# Patient Record
Sex: Female | Born: 1995 | Hispanic: Yes | Marital: Single | Smoking: Former smoker
Health system: Southern US, Community
[De-identification: ages and names within clinical notes are randomized; demographics above are authoritative.]

## PROBLEM LIST (undated history)

## (undated) ENCOUNTER — Inpatient Hospital Stay: Payer: Self-pay

## (undated) ENCOUNTER — Ambulatory Visit: Admission: EM | Payer: 59 | Source: Home / Self Care

## (undated) DIAGNOSIS — IMO0001 Reserved for inherently not codable concepts without codable children: Secondary | ICD-10-CM

## (undated) DIAGNOSIS — F329 Major depressive disorder, single episode, unspecified: Secondary | ICD-10-CM

## (undated) DIAGNOSIS — F32A Depression, unspecified: Secondary | ICD-10-CM

## (undated) DIAGNOSIS — F4329 Adjustment disorder with other symptoms: Secondary | ICD-10-CM

## (undated) DIAGNOSIS — F419 Anxiety disorder, unspecified: Secondary | ICD-10-CM

## (undated) HISTORY — PX: NO PAST SURGERIES: SHX2092

## (undated) SURGERY — LAPAROSCOPIC CHOLECYSTECTOMY
Anesthesia: General

---
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
## 2013-01-11 ENCOUNTER — Emergency Department: Payer: Self-pay | Admitting: Emergency Medicine

## 2013-01-11 LAB — URINALYSIS, COMPLETE
Bilirubin,UR: NEGATIVE
Blood: NEGATIVE
Glucose,UR: NEGATIVE mg/dL (ref 0–75)
Ketone: NEGATIVE
Ph: 5 (ref 4.5–8.0)
Protein: 30
Specific Gravity: 1.025 (ref 1.003–1.030)
Squamous Epithelial: 12

## 2013-01-14 ENCOUNTER — Emergency Department: Payer: Self-pay | Admitting: Emergency Medicine

## 2014-06-08 NOTE — L&D Delivery Note (Cosign Needed)
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 Labor was induced with placement of FB, vaginal cytotec and pitocin. Patient had SROM and was complete at 0650. She labored down then pushed through several contractions to deliver her baby. Neonatology asked to attend birth for fetal tachycardia and slight maternal temperature elevation during labor. Patient GBS positive and received adequate treatment.   At 8:39 AM a viable and healthy female was delivered via Vaginal, Spontaneous Delivery (Presentation: ROA).  APGAR: 8, 9; weight pending.   Placenta status: intact.  Cord: 3 vessels with the following complications: none.  Cord pH: N/A  Pitocin, methergine and rectal cytotec given for primary postpartum hemorrhage.   Anesthesia:  Epidural Episiotomy:  None Lacerations:  Superficial periurethral tears bilaterally.  Suture Repair: N/A Est. Blood Loss (mL):  750  Mom to postpartum.  Baby to Couplet care / Skin to Skin.  Jamelle HaringHillary M Fitzgerald, MD [facility], PGY-1 04/07/2015, 9:11 AM

## 2014-06-14 ENCOUNTER — Emergency Department: Payer: Self-pay | Admitting: Emergency Medicine

## 2014-06-14 LAB — COMPREHENSIVE METABOLIC PANEL
ALK PHOS: 78 U/L
ALT: 28 U/L
AST: 17 U/L (ref 15–37)
Albumin: 4.1 g/dL (ref 3.4–5.0)
Anion Gap: 7 (ref 7–16)
BUN: 9 mg/dL (ref 7–18)
Bilirubin,Total: 0.2 mg/dL (ref 0.2–1.0)
CALCIUM: 9 mg/dL (ref 8.5–10.1)
CO2: 25 mmol/L (ref 21–32)
Chloride: 105 mmol/L (ref 98–107)
Creatinine: 0.59 mg/dL — ABNORMAL LOW (ref 0.60–1.30)
EGFR (African American): >60
Glucose: 93 mg/dL (ref 65–99)
Osmolality: 272 (ref 275–301)
Potassium: 4 mmol/L (ref 3.5–5.1)
Sodium: 137 mmol/L (ref 136–145)
Total Protein: 7.7 g/dL (ref 6.4–8.2)

## 2014-06-14 LAB — LIPASE, BLOOD: Lipase: 121 U/L (ref 73–393)

## 2014-06-14 LAB — URINALYSIS, COMPLETE
Bacteria: NONE SEEN
Bilirubin,UR: NEGATIVE
Blood: NEGATIVE
Glucose,UR: NEGATIVE mg/dL (ref 0–75)
KETONE: NEGATIVE
Nitrite: NEGATIVE
Ph: 6 (ref 4.5–8.0)
Protein: NEGATIVE
RBC, UR: NONE SEEN /HPF (ref 0–5)
Specific Gravity: 1.012 (ref 1.003–1.030)
Squamous Epithelial: 2

## 2014-06-14 LAB — CBC WITH DIFFERENTIAL/PLATELET
BASOS ABS: 0 10*3/uL (ref 0.0–0.1)
BASOS PCT: 0.4 %
EOS ABS: 0.1 10*3/uL (ref 0.0–0.7)
EOS PCT: 1.7 %
HCT: 44.7 % (ref 35.0–47.0)
HGB: 14.8 g/dL (ref 12.0–16.0)
Lymphocyte #: 2.3 10*3/uL (ref 1.0–3.6)
Lymphocyte %: 27.9 %
MCH: 28.5 pg (ref 26.0–34.0)
MCHC: 33.1 g/dL (ref 32.0–36.0)
MCV: 86 fL (ref 80–100)
Monocyte #: 0.6 x10 3/mm (ref 0.2–0.9)
Monocyte %: 7.5 %
Neutrophil #: 5.1 10*3/uL (ref 1.4–6.5)
Neutrophil %: 62.5 %
PLATELETS: 242 10*3/uL (ref 150–440)
RBC: 5.21 10*6/uL — ABNORMAL HIGH (ref 3.80–5.20)
RDW: 13.1 % (ref 11.5–14.5)
WBC: 8.1 10*3/uL (ref 3.6–11.0)

## 2014-06-14 LAB — HCG, QUANTITATIVE, PREGNANCY

## 2014-06-20 ENCOUNTER — Emergency Department: Payer: Self-pay | Admitting: Emergency Medicine

## 2014-08-28 LAB — OB RESULTS CONSOLE GC/CHLAMYDIA
Chlamydia: NEGATIVE
Gonorrhea: NEGATIVE

## 2014-08-28 LAB — OB RESULTS CONSOLE HIV ANTIBODY (ROUTINE TESTING): HIV: NONREACTIVE

## 2014-08-29 LAB — OB RESULTS CONSOLE RPR: RPR: NONREACTIVE

## 2014-08-29 LAB — OB RESULTS CONSOLE HEPATITIS B SURFACE ANTIGEN: Hepatitis B Surface Ag: NEGATIVE

## 2014-08-30 ENCOUNTER — Emergency Department: Payer: Self-pay | Admitting: Emergency Medicine

## 2014-08-30 LAB — CBC
HCT: 42 % (ref 35.0–47.0)
HGB: 13.8 g/dL (ref 12.0–16.0)
MCH: 27.9 pg (ref 26.0–34.0)
MCHC: 33 g/dL (ref 32.0–36.0)
MCV: 85 fL (ref 80–100)
Platelet: 251 10*3/uL (ref 150–440)
RBC: 4.96 10*6/uL (ref 3.80–5.20)
RDW: 13.3 % (ref 11.5–14.5)
WBC: 10.7 10*3/uL (ref 3.6–11.0)

## 2014-08-30 LAB — URINALYSIS, COMPLETE
BILIRUBIN, UR: NEGATIVE
Blood: NEGATIVE
Nitrite: NEGATIVE
PH: 7 (ref 4.5–8.0)
Protein: 30
Specific Gravity: 1.026 (ref 1.003–1.030)
WBC UR: 26 /HPF (ref 0–5)

## 2014-08-30 LAB — COMPREHENSIVE METABOLIC PANEL
ALK PHOS: 68 U/L
ALT: 37 U/L
Albumin: 4.4 g/dL
Anion Gap: 12 (ref 7–16)
BUN: 12 mg/dL
Bilirubin,Total: 0.8 mg/dL
CO2: 19 mmol/L — AB
Calcium, Total: 9.5 mg/dL
Chloride: 104 mmol/L
Creatinine: 0.52 mg/dL
EGFR (African American): >60
EGFR (Non-African Amer.): >60
Glucose: 118 mg/dL — ABNORMAL HIGH
Potassium: 3.2 mmol/L — ABNORMAL LOW
SGOT(AST): 29 U/L
SODIUM: 135 mmol/L
Total Protein: 7.8 g/dL

## 2014-08-30 LAB — HCG, QUANTITATIVE, PREGNANCY: BETA HCG, QUANT.: 115149 m[IU]/mL — AB

## 2014-08-30 LAB — LIPASE, BLOOD: LIPASE: 25 U/L

## 2014-09-08 ENCOUNTER — Emergency Department
Admit: 2014-09-08 | Disposition: A | Discharge status: Home / Self Care | Payer: Self-pay | Admitting: Emergency Medicine

## 2014-09-08 LAB — COMPREHENSIVE METABOLIC PANEL
ALBUMIN: 4.1 g/dL
ALK PHOS: 53 U/L
ALT: 35 U/L
Anion Gap: 7 (ref 7–16)
BUN: 6 mg/dL
Bilirubin,Total: 0.4 mg/dL
CALCIUM: 9.1 mg/dL
CO2: 23 mmol/L
Chloride: 103 mmol/L
Creatinine: 0.47 mg/dL
EGFR (African American): >60
EGFR (Non-African Amer.): >60
Glucose: 92 mg/dL
Potassium: 3.6 mmol/L
SGOT(AST): 26 U/L
Sodium: 133 mmol/L — ABNORMAL LOW
Total Protein: 7.2 g/dL

## 2014-09-08 LAB — URINALYSIS, COMPLETE
Bilirubin,UR: NEGATIVE
GLUCOSE, UR: NEGATIVE mg/dL (ref 0–75)
Nitrite: NEGATIVE
Ph: 6 (ref 4.5–8.0)
Protein: NEGATIVE
SPECIFIC GRAVITY: 1 (ref 1.003–1.030)
WBC UR: 6 /HPF (ref 0–5)

## 2014-09-08 LAB — CBC
HCT: 38.3 % (ref 35.0–47.0)
HGB: 12.7 g/dL (ref 12.0–16.0)
MCH: 28.2 pg (ref 26.0–34.0)
MCHC: 33.2 g/dL (ref 32.0–36.0)
MCV: 85 fL (ref 80–100)
Platelet: 275 10*3/uL (ref 150–440)
RBC: 4.51 10*6/uL (ref 3.80–5.20)
RDW: 12.9 % (ref 11.5–14.5)
WBC: 13.3 10*3/uL — ABNORMAL HIGH (ref 3.6–11.0)

## 2014-09-08 LAB — ETHANOL: Ethanol: <5 mg/dL

## 2014-09-08 LAB — DRUG SCREEN, URINE
Amphetamines, Ur Screen: NEGATIVE
BARBITURATES, UR SCREEN: NEGATIVE
Benzodiazepine, Ur Scrn: NEGATIVE
Cannabinoid 50 Ng, Ur ~~LOC~~: NEGATIVE
Cocaine Metabolite,Ur ~~LOC~~: NEGATIVE
MDMA (Ecstasy)Ur Screen: NEGATIVE
Methadone, Ur Screen: NEGATIVE
Opiate, Ur Screen: NEGATIVE
Phencyclidine (PCP) Ur S: NEGATIVE
TRICYCLIC, UR SCREEN: POSITIVE

## 2014-09-08 LAB — ACETAMINOPHEN LEVEL: Acetaminophen: <10 ug/mL

## 2014-09-08 LAB — SALICYLATE LEVEL: Salicylates, Serum: <4 mg/dL

## 2014-09-17 ENCOUNTER — Encounter: Admit: 2014-09-17 | Payer: Self-pay | Admitting: Maternal & Fetal Medicine

## 2014-10-07 NOTE — Consult Note (Signed)
PATIENT NAME:  Cheryl Fitzgerald, Cheryl Fitzgerald MR#:  [id number] DATE OF BIRTH:  [birth year]  DATE OF CONSULTATION:  09/08/2014  CONSULTING PHYSICIAN:  Evetta Renner K. Rahkim Rabalais, MD  AGE:   18 SEX:   Female.  SUBJECTIVE: The patient was seen in consultation. The patient is a 19 year old female who is not employed and currently has moved in to live with her baby's father, who is 21 years old. The patient reports that the baby's father is looking for a job for himself and for her. [place]. Mother kicked her out of the house when she found out she was pregnant and mother told her that she has to live with the baby's father.   PAST PSYCHIATRIC HISTORY: No previous history of inpatient hospital psychiatry. No history of suicide attempts. Not being followed by any psychiatrist.   CHIEF COMPLAINT: Last night I had arguments with my baby's father and arguments when so far that I didn't know what to do and I was upset and I called an ambulance to come help me and wanted my baby to be checked out. My baby was checked out by ambulance and doing fine and I came here for help.  ALCOHOL AND DRUGS:  Denied. Denies smoking nicotine cigarettes.  MENTAL STATUS: The patient alert and oriented. Calm, pleasant and cooperative. No agitation. Affect is neutral. Mood stable. Denies feeling depressed. Denies feeling hopeless or helpless.  No psychosis. Does not appear to be responding to internal stimuli.   Cognition intact. Insight and judgment adequate.     IMPRESSION:  Conflicts with baby's father. Recommend discharging the patient after discontinuing voluntary commitment. I recommend the patient to be followed in the community by mental health and get therapy so that she and her baby's father can learn to relate better with each other and not have conflicts. No medications.    ____________________________ Jannet MantisSurya K. Guss Bundehalla, MD skc:TT D: 09/08/2014 18:21:34 ET T: 09/08/2014 18:25:41  ET JOB#: [id number]  cc: Monika SalkSurya K. Guss Bundehalla, MD, <Dictator> Beau FannySURYA K Javaris Wigington MD ELECTRONICALLY SIGNED 09/15/2014 15:30

## 2014-12-14 ENCOUNTER — Ambulatory Visit
Admission: RE | Admit: 2014-12-14 | Discharge: 2014-12-14 | Disposition: A | Discharge status: Home / Self Care | Payer: Self-pay | Source: Ambulatory Visit | Attending: Infectious Diseases | Admitting: Infectious Diseases

## 2014-12-14 ENCOUNTER — Other Ambulatory Visit: Payer: Self-pay | Admitting: Infectious Diseases

## 2014-12-14 DIAGNOSIS — R7611 Nonspecific reaction to tuberculin skin test without active tuberculosis: Secondary | ICD-10-CM

## 2014-12-14 DIAGNOSIS — O26899 Other specified pregnancy related conditions, unspecified trimester: Secondary | ICD-10-CM | POA: Insufficient documentation

## 2014-12-18 ENCOUNTER — Encounter: Payer: Self-pay | Admitting: *Deleted

## 2014-12-18 ENCOUNTER — Observation Stay
Admission: EM | Admit: 2014-12-18 | Discharge: 2014-12-18 | Disposition: A | Discharge status: Home / Self Care | Payer: Self-pay | Attending: Obstetrics & Gynecology | Admitting: Obstetrics & Gynecology

## 2014-12-18 DIAGNOSIS — O4700 False labor before 37 completed weeks of gestation, unspecified trimester: Secondary | ICD-10-CM | POA: Diagnosis present

## 2014-12-18 NOTE — Discharge Instructions (Signed)
Patient to f/up with regularly scheduled prenatal appointment at [facility].  Patient will clarify with them at that [facility].

## 2014-12-18 NOTE — H&P (Signed)
Obstetric History and Physical  Cheryl RubensteinKarin L Peloso is a 19 y.o. G1P0 with unknown EDD "September or October" per Cheryl Fitzgerald. Presents for evaluation after having 2 strong contractions and vomiting after intercourse around midnight. She has not had any further contractions. Denies Vaginal bleeding or loss of fluid. With active fetal movement.    Prenatal Course Source of Care: [facility], [place] and plans to deliver here. Reports having an appt at [facility]. No records available for review.  Pregnancy complications or risks:There are no active problems to display for this patient.     Prenatal Transfer Tool   Past Medical History  Diagnosis Date  . Medical history non-contributory     Past Surgical History  Procedure Laterality Date  . No past surgeries      OB History  Gravida Para Term Preterm AB SAB TAB Ectopic Multiple Living  1             # Outcome Date GA Lbr Len/2nd Weight Sex Delivery Anes PTL Lv  1 Current               History   Social History  . Marital Status: Single    Spouse Name: N/A  . Number of Children: N/A  . Years of Education: N/A   Social History Main Topics  . Smoking status: Never Smoker   . Smokeless tobacco: Not on file  . Alcohol Use: No  . Drug Use: Yes    Special: Marijuana  . Sexual Activity: Yes   Other Topics Concern  . None   Social History Narrative    History reviewed. No pertinent family history.  Prescriptions prior to admission  Medication Sig Dispense Refill Last Dose  . Multiple Vitamin (MULTIVITAMIN) tablet Take 1 tablet by mouth daily.       No Known Allergies  Review of Systems: Negative except for what is mentioned in HPI.  Physical Exam: There were no vitals taken for this visit. GENERAL: Well-developed, well-nourished female in no acute distress.  LUNGS: Clear to auscultation bilaterally.  HEART: Regular rate and rhythm. ABDOMEN: Soft, nontender, nondistended,  gravid. EXTREMITIES: Nontender, no edema FHT: + FHT's Contractions: None per toco or Cheryl Fitzgerald's report   Pertinent Labs/Studies:   No results found for this or any previous visit (from the past 24 hour(s)).  Assessment : IUP with uncertain EGA, no evidence of preterm labor or hyperemesis  Plan: Discharge Home  Reviewed sporadic contractions vs preterm labor contractions. Return with regular contractions q 5-10 min, LOF or VB F/u as scheduled at [facility] on 7/19.  Encouraged Cheryl Fitzgerald to get clarification on her EDD for future visits.

## 2014-12-18 NOTE — OB Triage Note (Signed)
0005 patient states that she had intercourse and then she had vomitting and contractions.  Patient states that had 2 uc's.  Denies any bleeding or leakage of fluid.  Patient has had spotty care with [facility] and a clinic in [place] from where she just moved from.  She is unsure of the practice which she is assigned to.

## 2014-12-22 ENCOUNTER — Observation Stay
Admission: EM | Admit: 2014-12-22 | Discharge: 2014-12-22 | Disposition: A | Discharge status: Home / Self Care | Payer: Self-pay | Attending: Obstetrics and Gynecology | Admitting: Obstetrics and Gynecology

## 2014-12-22 ENCOUNTER — Encounter: Payer: Self-pay | Admitting: Obstetrics and Gynecology

## 2014-12-22 DIAGNOSIS — Z3A25 25 weeks gestation of pregnancy: Secondary | ICD-10-CM | POA: Insufficient documentation

## 2014-12-22 DIAGNOSIS — O26892 Other specified pregnancy related conditions, second trimester: Principal | ICD-10-CM | POA: Insufficient documentation

## 2014-12-22 DIAGNOSIS — R109 Unspecified abdominal pain: Secondary | ICD-10-CM | POA: Insufficient documentation

## 2014-12-22 HISTORY — DX: Major depressive disorder, single episode, unspecified: F32.9

## 2014-12-22 HISTORY — DX: Depression, unspecified: F32.A

## 2014-12-22 LAB — WET PREP, GENITAL
Clue Cells Wet Prep HPF POC: NONE SEEN
TRICH WET PREP: NONE SEEN
YEAST WET PREP: NONE SEEN

## 2014-12-22 LAB — URINALYSIS COMPLETE WITH MICROSCOPIC (ARMC ONLY)
BILIRUBIN URINE: NEGATIVE
Bacteria, UA: NONE SEEN
Glucose, UA: NEGATIVE mg/dL
HGB URINE DIPSTICK: NEGATIVE
Ketones, ur: NEGATIVE mg/dL
NITRITE: NEGATIVE
PH: 7 (ref 5.0–8.0)
PROTEIN: NEGATIVE mg/dL
Specific Gravity, Urine: 1.002 — ABNORMAL LOW (ref 1.005–1.030)

## 2014-12-22 LAB — CHLAMYDIA/NGC RT PCR (ARMC ONLY)
CHLAMYDIA TR: NOT DETECTED
N gonorrhoeae: NOT DETECTED

## 2014-12-22 NOTE — Discharge Instructions (Signed)
[facility] On the [facility]

## 2014-12-22 NOTE — OB Triage Note (Signed)
Pt presents with complaints of lower "uterus" pain that started last night and has gotten worse. Pt states pain is better when she lays on back.  Denies bleeding  Pt states she had intercourse last night.  Pt has appt at [facility] on the 19th. [place] . States she had U/S in [place] and they gave her a due date of 04/05/15  Which would make her 25/3 days gestation.  Denies any urinary symptoms.

## 2014-12-22 NOTE — MAU Provider Note (Signed)
OB History & Physical   History of Present Illness:  Chief Complaint: abdominal pain  HPI:  Cheryl Fitzgerald is a 19 y.o. G2P0010 female at 3786w1d dated by patient report.  She [place]  However, she recently [place] her care her in [place] [facility].  Her pregnancy has been complicated by positive TB skin test with a recent negative chest x-ray.  She presents to L&D for evaluation of the above.    She denies current contractions.   She denies Leakage of fluid.   She denies Vaginal Bleeding.   She notes Fetal movement.    She presents after onset of abdominal pain since last night. The pain is in her lower abdomen in both quadrants.  Lying down makes the pain better.  Moving around may make it worse.  She denies fevers, chills. She has had nausea recently.  Notes an ongoing non-productive cough.  She denies urinary symptoms. Denies vaginal symptoms.    Maternal Medical History:   Past Medical History  Diagnosis Date  . Depression     no medication    Past Surgical History  Procedure Laterality Date  . No past surgeries     Allergies: No Known Allergies  Prior to Admission medications   Medication Sig Start Date End Date Taking? Authorizing Provider  Multiple Vitamin (MULTIVITAMIN) tablet Take 1 tablet by mouth daily.    Historical Provider, MD    Gyn Hx:  She notes a history of chlamydia within the past year that [facility]  OB History  Gravida Para Term Preterm AB SAB TAB Ectopic Multiple Living  2    1 1         # Outcome Date GA Lbr Len/2nd Weight Sex Delivery Anes PTL Lv  2 Current           1 SAB 2014              [place] at [facility]  Social History: She  reports that she has never smoked. She does not have any smokeless tobacco history on file. She reports that she uses illicit drugs (Marijuana). She reports that she does not drink alcohol.  Family  History: family history is not on file.   Review of Systems: Negative x 10 systems reviewed except as noted in the HPI.    Physical Exam:  Vital Signs: Temp(Src) 98.5 F (36.9 C) (Oral)  Resp 20  Ht 5\' 4"  (1.626 m)  Wt 179 lb (81.194 kg)  BMI 30.71 kg/m2  LMP 06/29/2014 (Exact Date) General: no acute distress.  HEENT: normocephalic, atraumatic Heart: regular rate & rhythm.  No murmurs/rubs/gallops Lungs: clear to auscultation bilaterally Abdomen: soft, gravid, mildly tender to palpation in epigastric region, uterus is non-tender;  EFW: 2 pounds Pelvic:   External: Normal external female genitalia  Cervix: Dilation: Closed / Effacement (%): 20 / Station: Ballotable  Extremities: non-tender, symmetric, no edema bilaterally.  DTRs: 2+  Neurologic: Alert & oriented x 3.    Pertinent Results:  Prenatal Labs: Blood type/Rh unknown - no labs  Antibody screen unknown  Rubella unknown  RPR unknown  HBsAg unknown  HIV unknown  GC unknown  Chlamydia unknown  Genetic screening unknown  1 hour GTT n/a  3 hour GTT Na/  GBS unknown   Baseline FHR: normal range for GA  Contractions: absent Overall assessment: Appropriate for gestational age  Bedside  Ultrasound:  Number of Fetus: 1  Presentation: cephalic  Fluid: grossly normal  Placental Location: anterior-right EFW by biometry: 938g (2lb 1oz) with calculated GA of [redacted]w[redacted]d  Assessment:  Cheryl Fitzgerald is a 19 y.o. G56P0010 female at [redacted]w[redacted]d with abdominal pain in bilateral lower quadrants affecting pregnancy of unknown origin.  1) abdominal pain affecting pregnancy 2) obesity affecting pregnancy in second trimester 3) [redacted] weeks gestational age 14) BMI 30 5) her social situation seems tenuous potentially.  Her description of her relationship with the father of the baby sounds safe at this time.  Per her, she is receiving care through [facility].    Plan:  1. Abdominal pain:  1. Mild pain in epigastrium: likely gastritis. Recommend zantac  or prilosec 2. Will send urine culture (U/A not a clean catch), GC/CT PCR, wet prep 2. FWB: reassuring overall 3. If above negative, patient reassured that is likely round ligament pain. 4. Follow up as previously scheduled with health department  [name], MD 12/22/2014 2:10 PM

## 2014-12-22 NOTE — OB Triage Note (Signed)
Pt also states that she had an active TB skin test but does not have active TB but will start taking medication for TB.

## 2014-12-23 LAB — URINE CULTURE: SPECIAL REQUESTS: NORMAL

## 2015-01-08 ENCOUNTER — Observation Stay
Admission: EM | Admit: 2015-01-08 | Discharge: 2015-01-08 | Disposition: A | Discharge status: Home / Self Care | Payer: Self-pay | Attending: Obstetrics & Gynecology | Admitting: Obstetrics & Gynecology

## 2015-01-08 DIAGNOSIS — Z3A27 27 weeks gestation of pregnancy: Secondary | ICD-10-CM | POA: Insufficient documentation

## 2015-01-08 DIAGNOSIS — O479 False labor, unspecified: Secondary | ICD-10-CM | POA: Diagnosis present

## 2015-01-08 DIAGNOSIS — O4703 False labor before 37 completed weeks of gestation, third trimester: Principal | ICD-10-CM | POA: Insufficient documentation

## 2015-01-08 DIAGNOSIS — N898 Other specified noninflammatory disorders of vagina: Secondary | ICD-10-CM

## 2015-01-08 DIAGNOSIS — R102 Pelvic and perineal pain: Secondary | ICD-10-CM

## 2015-01-08 LAB — URINALYSIS COMPLETE WITH MICROSCOPIC (ARMC ONLY)
Bacteria, UA: NONE SEEN
Bilirubin Urine: NEGATIVE
Glucose, UA: NEGATIVE mg/dL
Hgb urine dipstick: NEGATIVE
Ketones, ur: NEGATIVE mg/dL
Nitrite: NEGATIVE
PROTEIN: NEGATIVE mg/dL
Specific Gravity, Urine: 1.009 (ref 1.005–1.030)
pH: 8 (ref 5.0–8.0)

## 2015-01-08 MED ORDER — ONDANSETRON HCL 4 MG/2ML IJ SOLN: 4.0000 mg | Freq: Four times a day (QID) | INTRAMUSCULAR | Status: DC | PRN

## 2015-01-08 MED ORDER — ACETAMINOPHEN 325 MG PO TABS: 650.0000 mg | ORAL_TABLET | ORAL | Status: DC | PRN

## 2015-01-08 MED ORDER — TERCONAZOLE 0.8 % VA CREA: 1.0000 | TOPICAL_CREAM | Freq: Every day | VAGINAL | Status: DC

## 2015-01-08 NOTE — OB Triage Note (Signed)
Pt presents with complaints of pain in vagina, itching and swelling also,   Denies any bleeding, states white discharge,

## 2015-01-08 NOTE — MAU Provider Note (Signed)
OB History & Physical   History of Present Illness:  Chief Complaint: vaginal pain and itch  HPI:  Cheryl Fitzgerald is a 19 y.o. G2P0010 female at 27w dated by patient report.  She has received all of her prenatal care [place] [facility] [place].  However, she recently established her care her in [place] at [facility].  Her pregnancy has been complicated by positive TB skin test with a recent negative chest x-ray. Recent start of Rifampin. She presents to L&D for evaluation of the above.    She denies current contractions.   She denies Leakage of fluid.   She denies Vaginal Bleeding.   She notes Fetal movement.    Has sx's of itch and vag pain, called 911.  No s/sx PTL.  No recent infections or trauma.  Maternal Medical History:   Past Medical History  Diagnosis Date  . Depression     no medication    Past Surgical History  Procedure Laterality Date  . No past surgeries     Allergies: No Known Allergies  Prior to Admission medications   Medication Sig Start Date End Date Taking? Authorizing Provider  Multiple Vitamin (MULTIVITAMIN) tablet Take 1 tablet by mouth daily.    Historical Provider, MD    Gyn Hx:  She notes a history of chlamydia within the past year that was treated here at [facility]  OB History  Gravida Para Term Preterm AB SAB TAB Ectopic Multiple Living  # Outcome Date GA Lbr Len/2nd Weight Sex Delivery Anes PTL Lv  2 Current           1 SAB 2014              Prenatal care site: In [place], [place], now at [facility]  Social History: She  reports that she has never smoked. She does not have any smokeless tobacco history on file. She reports that she uses illicit drugs (Marijuana). She reports that she does not drink alcohol.  Family History: family history neg Review of Systems: Negative x 10 systems reviewed except as noted in the HPI.    Physical Exam:  Vital Signs: BP 121/76 mmHg  Pulse 93  Temp(Src) 98.6 F (37 C) (Oral)  Resp 20  SpO2  98%  LMP 06/29/2014 (Exact Date) General: no acute distress.  HEENT: normocephalic, atraumatic Heart: regular rate & rhythm.  No murmurs/rubs/gallops Lungs: clear to auscultation bilaterally Abdomen: soft, gravid, mildly tender to palpation in epigastric region, uterus is non-tender;  EFW: 2 pounds Pelvic:   External: Normal external female genitalia  Cervix:  cl /  th /  hi  Extremities: non-tender, symmetric, no edema bilaterally.  DTRs: 2+  Neurologic: Alert & oriented x 3.    Pertinent Results:  Baseline FHR: normal range for GA  Contractions: absent Overall assessment: Appropriate for gestational age  Wet Prep- pos yeast, neg clue and  trich  Assessment:  Cheryl Fitzgerald is a 19 y.o. G69P0010 female at 27w  with vaginal pain and itch, affecting pregnancy of unknown origin.  1) Yeast Infection 2) obesity affecting pregnancy in second trimester 3) [redacted] weeks gestational age 64) BMI 30   Plan:  1. Yeast Inf; treat w Terazol 2. FWB: reassuring overall  [name], MD 01/08/2015 5:01 PM

## 2015-01-08 NOTE — ED Provider Notes (Signed)
[facility] Emergency Department Provider Note  ____________________________________________  Time seen: 1:05 PM on arrival by EMS  I have reviewed the triage vital signs and the nursing notes.   HISTORY  Chief Complaint No chief complaint on file.    HPI Cheryl Fitzgerald is a 19 y.o. female who complains of contractions and bilateral lower quadrant abdominal pain and vaginal discharge with itching and pain and swelling of the vulvar area. She is been going on for the last few days. Notably, she is taking rifampin for tuberculosis and with this has noticed a lot of GI upset with nausea and orange discoloration of urine and stool.  Denies vaginal bleeding or fevers chills .  She has fetal movements as usual. No chest pain or shortness of breath. She does also report dysuria.     Past Medical History  Diagnosis Date  . Depression     no medication    Patient Active Problem List   Diagnosis Date Noted  . Labor and delivery, indication for care 12/22/2014  . Threatened preterm labor 12/18/2014    Past Surgical History  Procedure Laterality Date  . No past surgeries      Current Outpatient Rx  Name  Route  Sig  Dispense  Refill  . Multiple Vitamin (MULTIVITAMIN) tablet   Oral   Take 1 tablet by mouth daily.           Allergies Review of patient's allergies indicates no known allergies.  No family history on file.  Social History History  Substance Use Topics  . Smoking status: Never Smoker   . Smokeless tobacco: Not on file  . Alcohol Use: No  occasional marijuana use  Review of Systems  Constitutional: No fever or chills. No weight changes Eyes:No blurry vision or double vision.  ENT: No sore throat. Cardiovascular: No chest pain. Respiratory: No dyspnea or cough. Gastrointestinal: generalized abdominal pain with nausea. No vomiting or diarrheaa.  No BRBPR or melena. Genitourinary: positive dysuria Musculoskeletal: Negative for  back pain. No joint swelling or pain. Skin: Negative for rash. Neurological: Negative for headaches, focal weakness or numbness. Psychiatric:No anxiety or depression.   Endocrine:No hot/cold intolerance, changes in energy, or sleep difficulty.  10-point ROS otherwise negative.  ____________________________________________   PHYSICAL EXAM:  VITAL SIGNS: ED Triage Vitals  Enc Vitals Group     BP --      Pulse --      Resp --      Temp --      Temp src --      SpO2 --      Weight --      Height --      Head Cir --      Peak Flow --      Pain Score --      Pain Loc --      Pain Edu? --      Excl. in GC? --      Constitutional: Alert and oriented. Well appearing and in no distress. Eyes: No scleral icterus. No conjunctival pallor. PERRL. EOMI ENT   Head: Normocephalic and atraumatic.   Nose: No congestion/rhinnorhea. No septal hematoma   Mouth/Throat: MMM, no pharyngeal erythema. No peritonsillar mass. No uvula shift.   Neck: No stridor. No SubQ emphysema. No meningismus. Hematological/Lymphatic/Immunilogical: No cervical lymphadenopathy. Cardiovascular: RRR. Normal and symmetric distal pulses are present in all extremities. No murmurs, rubs, or gallops. Respiratory: Normal respiratory effort without tachypnea nor retractions. Breath sounds  are clear and equal bilaterally. No wheezes/rales/rhonchi. Gastrointestinal: Soft with mild tenderness in left lower quadrantr. No distention. There is no CVA tenderness.  No rebound, rigidity, or guarding. Genitourinary: deferred Musculoskeletal: Nontender with normal range of motion in all extremities. No joint effusions.  No lower extremity tenderness.  No edema. Neurologic:   Normal speech and language.  CN 2-10 normal. Motor grossly intact. No pronator drift.  Normal gait. No gross focal neurologic deficits are appreciated.  Skin:  Skin is warm, dry and intact. No rash noted.  No petechiae, purpura, or  bullae. Psychiatric: Mood and affect are normal. Speech and behavior are normal. Patient exhibits appropriate insight and judgment.  ____________________________________________    LABS (pertinent positives/negatives) (all labs ordered are listed, but only abnormal results are displayed) Labs Reviewed - No data to display ____________________________________________   EKG    ____________________________________________    RADIOLOGY    ____________________________________________   PROCEDURES  ____________________________________________   INITIAL IMPRESSION / ASSESSMENT AND PLAN / ED COURSE  Pertinent labs & imaging results that were available during my care of the patient were reviewed by me and considered in my medical decision making (see chart for details).  Patient presents with GI upset and nausea, as well as concern for infection and urinary tract infection, all of which may be secondary to rifampin use. In the setting of third trimester pregnancy these are also all obstetrics related area no further ED workup is needed. Patient will be transferred to labor and delivery for further evaluation, particularly since she will need NST for her reported contractions.  ____________________________________________   FINAL CLINICAL IMPRESSION(S) / ED DIAGNOSES  Final diagnoses:  Preterm contractions, third trimester  Vaginal discharge  Vaginal pain      Sharman Cheek, MD 01/08/15 1324

## 2015-02-28 ENCOUNTER — Observation Stay
Admission: EM | Admit: 2015-02-28 | Discharge: 2015-02-28 | Disposition: A | Discharge status: Home / Self Care | Payer: Self-pay | Attending: Obstetrics & Gynecology | Admitting: Obstetrics & Gynecology

## 2015-02-28 DIAGNOSIS — Z3A33 33 weeks gestation of pregnancy: Secondary | ICD-10-CM | POA: Insufficient documentation

## 2015-02-28 DIAGNOSIS — R42 Dizziness and giddiness: Secondary | ICD-10-CM

## 2015-02-28 DIAGNOSIS — O26893 Other specified pregnancy related conditions, third trimester: Principal | ICD-10-CM | POA: Insufficient documentation

## 2015-02-28 NOTE — OB Triage Note (Signed)
Patient arrived EMS.  Complaining of one contraction that happened about 2110 while she was walking home from a friend's house.  Patient states she felt short of breath at the time.  Patient states she has been dizzy all day.

## 2015-02-28 NOTE — Discharge Instructions (Signed)
Braxton Hicks Contractions °Contractions of the uterus can occur throughout pregnancy. Contractions are not always a sign that you are in labor.  °WHAT ARE BRAXTON HICKS CONTRACTIONS?  °Contractions that occur before labor are called Braxton Hicks contractions, or false labor. Toward the end of pregnancy (32-34 weeks), these contractions can develop more often and may become more forceful. This is not true labor because these contractions do not result in opening (dilatation) and thinning of the cervix. They are sometimes difficult to tell apart from true labor because these contractions can be forceful and people have different pain tolerances. You should not feel embarrassed if you go to the hospital with false labor. Sometimes, the only way to tell if you are in true labor is for your health care provider to look for changes in the cervix. °If there are no prenatal problems or other health problems associated with the pregnancy, it is completely safe to be sent home with false labor and await the onset of true labor. °HOW CAN YOU TELL THE DIFFERENCE BETWEEN TRUE AND FALSE LABOR? °False Labor °· The contractions of false labor are usually shorter and not as hard as those of true labor.   °· The contractions are usually irregular.   °· The contractions are often felt in the front of the lower abdomen and in the groin.   °· The contractions may go away when you walk around or change positions while lying down.   °· The contractions get weaker and are shorter lasting as time goes on.   °· The contractions do not usually become progressively stronger, regular, and closer together as with true labor.   °True Labor °· Contractions in true labor last 30-70 seconds, become very regular, usually become more intense, and increase in frequency.   °· The contractions do not go away with walking.   °· The discomfort is usually felt in the top of the uterus and spreads to the lower abdomen and low back.   °· True labor can be  determined by your health care provider with an exam. This will show that the cervix is dilating and getting thinner.   °WHAT TO REMEMBER °· Keep up with your usual exercises and follow other instructions given by your health care provider.   °· Take medicines as directed by your health care provider.   °· Keep your regular prenatal appointments.   °· Eat and drink lightly if you think you are going into labor.   °· If Braxton Hicks contractions are making you uncomfortable:   °¨ Change your position from lying down or resting to walking, or from walking to resting.   °¨ Sit and rest in a tub of warm water.   °¨ Drink 2-3 glasses of water. Dehydration may cause these contractions.   °¨ Do slow and deep breathing several times an hour.   °WHEN SHOULD I SEEK IMMEDIATE MEDICAL CARE? °Seek immediate medical care if: °· Your contractions become stronger, more regular, and closer together.   °· You have fluid leaking or gushing from your vagina.   °· You have a fever.   °· You pass blood-tinged mucus.   °· You have vaginal bleeding.   °· You have continuous abdominal pain.   °· You have low back pain that you never had before.   °· You feel your baby's head pushing down and causing pelvic pressure.   °· Your baby is not moving as much as it used to.   °Document Released: 05/25/2005 Document Revised: 05/30/2013 Document Reviewed: 03/06/2013 °[employer]® Patient Information ©2015 [employer]. This information is not intended to replace advice given to you by your health care   provider. Make sure you discuss any questions you have with your health care provider. ° °

## 2015-03-06 ENCOUNTER — Observation Stay
Admission: EM | Admit: 2015-03-06 | Discharge: 2015-03-06 | Disposition: A | Discharge status: Home / Self Care | Payer: Self-pay | Attending: Obstetrics & Gynecology | Admitting: Obstetrics & Gynecology

## 2015-03-06 DIAGNOSIS — Z3A35 35 weeks gestation of pregnancy: Secondary | ICD-10-CM | POA: Insufficient documentation

## 2015-03-06 DIAGNOSIS — O368130 Decreased fetal movements, third trimester, not applicable or unspecified: Principal | ICD-10-CM | POA: Insufficient documentation

## 2015-03-06 DIAGNOSIS — O368190 Decreased fetal movements, unspecified trimester, not applicable or unspecified: Secondary | ICD-10-CM | POA: Diagnosis present

## 2015-03-06 NOTE — Discharge Summary (Signed)
S: Pt feeling well. Spoke to hospital social worker who will f/u with pt outpatient and when she returns for delivery.   O: Continued category 1 NST.   A: IUP at [redacted]w[redacted]d, category 1 NST  P: D/c home Social work f/u outpt  F/u at [facility] this week

## 2015-03-06 NOTE — OB Triage Note (Signed)
Recvd to OBS 2 per wheelchair with complaints of coughing (productive - green to yellow), stuffy nose, sore throat, chills at night, chest pain.  Other household members are sick. Has been self treating with Tylenol.  Last dose was 2 days ago.   Changed to gown and to bed.  EFM applied.   Oriented to room.  Plan of care discussed.
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
## 2015-03-08 LAB — OB RESULTS CONSOLE GBS: GBS: POSITIVE

## 2015-03-22 ENCOUNTER — Observation Stay
Admission: EM | Admit: 2015-03-22 | Discharge: 2015-03-24 | Disposition: A | Discharge status: Home / Self Care | Payer: Self-pay | Attending: Obstetrics and Gynecology | Admitting: Obstetrics and Gynecology

## 2015-03-22 DIAGNOSIS — R058 Other specified cough: Secondary | ICD-10-CM

## 2015-03-22 DIAGNOSIS — O368130 Decreased fetal movements, third trimester, not applicable or unspecified: Principal | ICD-10-CM | POA: Insufficient documentation

## 2015-03-22 DIAGNOSIS — R42 Dizziness and giddiness: Secondary | ICD-10-CM | POA: Insufficient documentation

## 2015-03-22 DIAGNOSIS — Z3A39 39 weeks gestation of pregnancy: Secondary | ICD-10-CM | POA: Insufficient documentation

## 2015-03-22 DIAGNOSIS — F1221 Cannabis dependence, in remission: Secondary | ICD-10-CM | POA: Insufficient documentation

## 2015-03-22 DIAGNOSIS — Z594 Lack of adequate food and safe drinking water: Secondary | ICD-10-CM | POA: Insufficient documentation

## 2015-03-22 DIAGNOSIS — F4329 Adjustment disorder with other symptoms: Secondary | ICD-10-CM | POA: Diagnosis present

## 2015-03-22 DIAGNOSIS — T730XXA Starvation, initial encounter: Secondary | ICD-10-CM | POA: Diagnosis present

## 2015-03-22 DIAGNOSIS — Z227 Latent tuberculosis: Secondary | ICD-10-CM | POA: Diagnosis present

## 2015-03-22 DIAGNOSIS — F329 Major depressive disorder, single episode, unspecified: Secondary | ICD-10-CM | POA: Insufficient documentation

## 2015-03-22 DIAGNOSIS — F4323 Adjustment disorder with mixed anxiety and depressed mood: Secondary | ICD-10-CM | POA: Insufficient documentation

## 2015-03-22 DIAGNOSIS — Z591 Inadequate housing, unspecified: Secondary | ICD-10-CM

## 2015-03-22 DIAGNOSIS — R45851 Suicidal ideations: Secondary | ICD-10-CM | POA: Insufficient documentation

## 2015-03-22 DIAGNOSIS — B852 Pediculosis, unspecified: Secondary | ICD-10-CM | POA: Insufficient documentation

## 2015-03-22 DIAGNOSIS — O99340 Other mental disorders complicating pregnancy, unspecified trimester: Secondary | ICD-10-CM | POA: Diagnosis present

## 2015-03-22 DIAGNOSIS — R7611 Nonspecific reaction to tuberculin skin test without active tuberculosis: Secondary | ICD-10-CM | POA: Insufficient documentation

## 2015-03-22 DIAGNOSIS — R109 Unspecified abdominal pain: Secondary | ICD-10-CM

## 2015-03-22 DIAGNOSIS — O368190 Decreased fetal movements, unspecified trimester, not applicable or unspecified: Secondary | ICD-10-CM | POA: Diagnosis present

## 2015-03-22 DIAGNOSIS — F32A Depression, unspecified: Secondary | ICD-10-CM | POA: Diagnosis present

## 2015-03-22 DIAGNOSIS — K068 Other specified disorders of gingiva and edentulous alveolar ridge: Secondary | ICD-10-CM | POA: Insufficient documentation

## 2015-03-22 DIAGNOSIS — O0973 Supervision of high risk pregnancy due to social problems, third trimester: Secondary | ICD-10-CM

## 2015-03-22 DIAGNOSIS — O26899 Other specified pregnancy related conditions, unspecified trimester: Secondary | ICD-10-CM

## 2015-03-22 DIAGNOSIS — R05 Cough: Secondary | ICD-10-CM | POA: Insufficient documentation

## 2015-03-22 HISTORY — DX: Adjustment disorder with other symptoms: F43.29

## 2015-03-22 LAB — URINE DRUG SCREEN, QUALITATIVE (ARMC ONLY)
AMPHETAMINES, UR SCREEN: NOT DETECTED
BARBITURATES, UR SCREEN: NOT DETECTED
BENZODIAZEPINE, UR SCRN: NOT DETECTED
Cannabinoid 50 Ng, Ur ~~LOC~~: NOT DETECTED
Cocaine Metabolite,Ur ~~LOC~~: NOT DETECTED
MDMA (Ecstasy)Ur Screen: NOT DETECTED
METHADONE SCREEN, URINE: NOT DETECTED
OPIATE, UR SCREEN: NOT DETECTED
Phencyclidine (PCP) Ur S: NOT DETECTED
TRICYCLIC, UR SCREEN: NOT DETECTED

## 2015-03-22 MED ORDER — PERMETHRIN 1 % EX LOTN
TOPICAL_LOTION | Freq: Once | CUTANEOUS | Status: DC
  Filled 2015-03-22: qty 59

## 2015-03-22 MED ORDER — PERMETHRIN 5 % EX CREA
TOPICAL_CREAM | Freq: Once | CUTANEOUS | Status: AC
  Administered 2015-03-22: 1 via TOPICAL
  Filled 2015-03-22 (×2): qty 60

## 2015-03-22 MED ORDER — ACETAMINOPHEN 325 MG PO TABS
ORAL_TABLET | ORAL | Status: AC
  Administered 2015-03-22: 650 mg via ORAL
  Filled 2015-03-22: qty 2

## 2015-03-22 MED ORDER — ACETAMINOPHEN 325 MG PO TABS
650.0000 mg | ORAL_TABLET | Freq: Four times a day (QID) | ORAL | Status: DC | PRN
  Administered 2015-03-22 – 2015-03-24 (×3): 650 mg via ORAL
  Filled 2015-03-22 (×2): qty 2
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
## 2015-03-22 NOTE — Plan of Care (Signed)
Cheryl Fitzgerald, Child psychotherapistsocial worker, called and stated she has spoken to FOB's family members via an interpreter.  She stated that she informed them of the need for their son to have the trailer that they live in cleaned and to use a bug bomb for the lice and roaches and possibly mice that pt states are present in their living conditions. Cheryl Fitzgerald told them that she is sending money home with pt to cover the cost of a bug bomb for this purpose. They state that they will see that it is done and their son is also treated for lice in his hair as the pt also is going to be treated with this hospital visit for this problem. The family members state that they are going to be on their way up to see the pt shortly.  Cheryl Fitzgerald has informed them that she has a Comptrollersitter for safety precautions until she is seen by the Psych MD.  Also told that protective clothing is being worn by the staff due to the lice presence.  Ellison Carwin Kennley Schwandt RNC

## 2015-03-23 ENCOUNTER — Observation Stay: Payer: Self-pay

## 2015-03-23 ENCOUNTER — Encounter: Payer: Self-pay | Admitting: Psychiatry

## 2015-03-23 DIAGNOSIS — F4329 Adjustment disorder with other symptoms: Secondary | ICD-10-CM

## 2015-03-23 DIAGNOSIS — F4323 Adjustment disorder with mixed anxiety and depressed mood: Secondary | ICD-10-CM

## 2015-03-23 HISTORY — DX: Adjustment disorder with other symptoms: F43.29

## 2015-03-23 LAB — CBC
HCT: 38.4 % (ref 35.0–47.0)
Hemoglobin: 13.3 g/dL (ref 12.0–16.0)
MCH: 29.9 pg (ref 26.0–34.0)
MCHC: 34.7 g/dL (ref 32.0–36.0)
MCV: 86 fL (ref 80.0–100.0)
PLATELETS: 178 10*3/uL (ref 150–440)
RBC: 4.46 MIL/uL (ref 3.80–5.20)
RDW: 13.8 % (ref 11.5–14.5)
WBC: 7.6 10*3/uL (ref 3.6–11.0)

## 2015-03-23 MED ORDER — DIPHENHYDRAMINE HCL 25 MG PO CAPS
25.0000 mg | ORAL_CAPSULE | Freq: Every evening | ORAL | Status: DC | PRN
  Administered 2015-03-24 (×2): 25 mg via ORAL
  Filled 2015-03-23 (×3): qty 1
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
## 2015-03-23 NOTE — Progress Notes (Signed)
Pt has not voiced suicidal ideation today. Sitter at the bedside. Waiting on psych consult. Dr. Elesa MassedWard states Dr. Jenne CampusMcQueen is to see her tonight.  Ruta HindsKelly Dauntae Derusha, RN 03/23/15 [phone]
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
## 2015-03-23 NOTE — Progress Notes (Signed)
Pt c/o contractions that feel like the lower part of her abdomen is "tightening" and that the baby is "moving around and trying to get out" as well as LOF. Dr. Jean RosenthalJackson paged and pt moved to L&D obs per Jackson's recommendation. Sitter with pt.

## 2015-03-24 MED ORDER — GUAIFENESIN 100 MG/5ML PO SOLN: 5.0000 mL | ORAL | Status: DC | PRN

## 2015-03-24 MED ORDER — GUAIFENESIN 100 MG/5ML PO SOLN
5.0000 mL | ORAL | Status: DC | PRN
  Administered 2015-03-24: 100 mg via ORAL
  Filled 2015-03-24: qty 10

## 2015-03-24 MED ORDER — MENTHOL 3 MG MT LOZG
1.0000 | LOZENGE | OROMUCOSAL | Status: DC | PRN
  Administered 2015-03-24: 3 mg via ORAL
  Filled 2015-03-24: qty 9
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
## 2015-03-24 NOTE — Consult Note (Addendum)
[facility] Face-to-Face Psychiatry Consult   Reason for Consult: SI with pregnancy Referring Physician:  Farrel Conners, CNM  Patient Identification: Cheryl Fitzgerald MRN:  [id number]   Principal Diagnosis: <principal problem not specified> Diagnosis:   Patient Active Problem List   Diagnosis Date Noted  . Adjustment disorder with mixed emotional features [F43.23] 03/23/2015  . Depression complicating pregnancy, antepartum [O99.340, F32.9] 03/22/2015  . Decreased fetal movement [O36.8190] 03/22/2015  . Supervision of high risk pregnancy due to social problems in third trimester [O09.73] 03/22/2015  . 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 Inadequate housing [Z59.1] 03/22/2015  . Food hunger [T73.0XXA] 03/22/2015  . Decreased fetal movement determined by examination [O36.8190] 03/06/2015  . Dizziness [R42] 02/28/2015  . Irregular contractions [O62.2] 01/08/2015  . Labor and delivery, indication for care [O75.9] 12/22/2014  . Threatened preterm labor [O47.00] 12/18/2014    Total Time spent with patient: 35 minutes  Subjective:   Cheryl Fitzgerald is a 19 y.o. female patient with no psychiatric history who is currently pregnant at [redacted]w[redacted]d, G2P0010 admitted with SI while pregnant.   Today, the pt and the father of her unborn child, Elita Quick, were interviewed together. The pt shared she is doing well today. She discussed feeling safe to go home and denies wanting to harm herself. She notes she continues to want support from the father of her child and his family as she has no family nearby.   Discussed social issues with the pt and the father of her child, [place]. He shared he is anxious about the birth of his daughter and wants to help as much as he can. He notes the home is being exterminated at this time and was exterminated yesterday as well. He feels safe with the patient at home.   They both want their child to grow up in a home where the parents do not argue. Pt denies SI, HI and  AVH.   Past Psychiatric History: Dx: Denies Psychiatrist: Denies Therapist: Denies Hospitalizations: Yes at 19yo due to running away ECT: Denies Suicide attempt/Self-harm: Denies Homicide attempts/harming others: Denies Previous Medication Trials: Denies   Risk to Self: Is patient at risk for suicide?: No Risk to Others:   Prior Inpatient Therapy:   Prior Outpatient Therapy:    Past Medical History:  Past Medical History  Diagnosis Date  . Depression     no medication  . Adjustment disorder with mixed emotional features 03/23/2015    Past Surgical History  Procedure Laterality Date  . No past surgeries     Family History: No family history on file. Family Psychiatric  History: Denies   Social History:  History  Alcohol Use No     History  Drug Use  . Yes  . Special: Marijuana    Social History   Social History  . Marital Status: Single    Spouse Name: N/A  . Number of Children: N/A  . Years of Education: N/A   Social History Main Topics  . Smoking status: Never Smoker   . Smokeless tobacco: Never Used  . Alcohol Use: No  . Drug Use: Yes    Special: Marijuana  . Sexual Activity: Yes   Other Topics Concern  . Not on file   Social History Narrative   Lives with father of baby.  Her family is in [place]. She is not in a romantic relationship with the father of the baby.   Additional Social History: Lives with the father of her unborn child.  They  are not dating or engaged.  [place]   Allergies:  No Known Allergies  Labs:  Results for orders placed or performed during the hospital encounter of 03/22/15 (from the past 48 hour(s))  Urine Drug Screen, Qualitative ([facility] only)     Status: None   Collection Time: 03/22/15  5:03 PM  Result Value Ref Range   Tricyclic, Ur Screen NONE DETECTED NONE DETECTED   Amphetamines, Ur Screen NONE DETECTED NONE DETECTED   MDMA (Ecstasy)Ur Screen NONE DETECTED NONE DETECTED   Cocaine  Metabolite,Ur [place] NONE DETECTED NONE DETECTED   Opiate, Ur Screen NONE DETECTED NONE DETECTED   Phencyclidine (PCP) Ur S NONE DETECTED NONE DETECTED   Cannabinoid 50 Ng, Ur  NONE DETECTED NONE DETECTED   Barbiturates, Ur Screen NONE DETECTED NONE DETECTED   Benzodiazepine, Ur Scrn NONE DETECTED NONE DETECTED   Methadone Scn, Ur NONE DETECTED NONE DETECTED    Comment: (NOTE) 100  Tricyclics, urine               Cutoff 1000 ng/mL 200  Amphetamines, urine             Cutoff 1000 ng/mL 300  MDMA (Ecstasy), urine           Cutoff 500 ng/mL 400  Cocaine Metabolite, urine       Cutoff 300 ng/mL 500  Opiate, urine                   Cutoff 300 ng/mL 600  Phencyclidine (PCP), urine      Cutoff 25 ng/mL 700  Cannabinoid, urine              Cutoff 50 ng/mL 800  Barbiturates, urine             Cutoff 200 ng/mL 900  Benzodiazepine, urine           Cutoff 200 ng/mL 1000 Methadone, urine                Cutoff 300 ng/mL 1100 1200 The urine drug screen provides only a preliminary, unconfirmed 1300 analytical test result and should not be used for non-medical 1400 purposes. Clinical consideration and professional judgment should 1500 be applied to any positive drug screen result due to possible 1600 interfering substances. A more specific alternate chemical method 1700 must be used in order to obtain a confirmed analytical result.  1800 Gas chromato graphy / mass spectrometry (GC/MS) is the preferred 1900 confirmatory method.   CBC     Status: None   Collection Time: 03/23/15 11:45 AM  Result Value Ref Range   WBC 7.6 3.6 - 11.0 K/uL   RBC 4.46 3.80 - 5.20 MIL/uL   Hemoglobin 13.3 12.0 - 16.0 g/dL   HCT 40.938.4 81.135.0 - 91.447.0 %   MCV 86.0 80.0 - 100.0 fL   MCH 29.9 26.0 - 34.0 pg   MCHC 34.7 32.0 - 36.0 g/dL   RDW 78.213.8 95.611.5 - 21.314.5 %   Platelets 178 150 - 440 K/uL    Current Facility-Administered Medications  Medication Dose Route Frequency Provider Last Rate Last Dose  . acetaminophen (TYLENOL)  tablet 650 mg  650 mg Oral Q6H PRN Farrel Connersolleen Gutierrez, CNM   650 mg at 03/24/15 1059  . diphenhydrAMINE (BENADRYL) capsule 25 mg  25 mg Oral QHS PRN,MR X 1 Chelsea C Ward, MD   25 mg at 03/24/15 0843  . guaiFENesin (ROBITUSSIN) 100 MG/5ML solution 100 mg  5 mL Oral  Q4H PRN Chelsea C Ward, MD      . menthol-cetylpyridinium (CEPACOL) lozenge 3 mg  1 lozenge Oral PRN Elenora Fender Ward, MD        Musculoskeletal: Strength & Muscle Tone: within normal limits Gait & Station: normal Patient leans: N/A  Psychiatric Specialty Exam: Review of Systems  Constitutional: Negative.   Eyes: Negative.   Respiratory: Negative.   Cardiovascular: Negative.   Gastrointestinal: Negative.   Genitourinary: Negative.   Musculoskeletal: Positive for back pain. Negative for myalgias, joint pain, falls and neck pain.  Neurological: Negative.   Endo/Heme/Allergies: Negative.   Psychiatric/Behavioral: Negative.     Blood pressure 109/43, pulse 100, temperature 98 F (36.7 C), temperature source Oral, resp. rate 18, last menstrual period 06/29/2014, SpO2 100 %.There is no weight on file to calculate BMI.  General Appearance: Neat and tattoos on face  Eye Contact::  Good  Speech:  Clear and Coherent and Normal Rate  Volume:  Normal  Mood:  Anxious  Affect:  Congruent  Thought Process:  Coherent, Goal Directed, Intact, Linear and Logical  Orientation:  Full (Time, Place, and Person)  Thought Content:  Negative  Suicidal Thoughts:  No  Homicidal Thoughts:  No  Memory:  Immediate;   Good Recent;   Good Remote;   Good  Judgement:  Fair  Insight:  Fair  Psychomotor Activity:  Normal  Concentration:  Good  Recall:  Good  Fund of Knowledge:Good  Language: Good  Akathisia:  Negative  Handed:  Right  AIMS (if indicated):     Assets:  Communication Skills Desire for Improvement Housing Physical Health Resilience Social Support Talents/Skills  ADL's:  Intact  Cognition: WNL  Sleep:      Treatment Plan  Summary: Plan Will re-evaluate on 03-24-2015.   1. Please follow recommendations from Ned Card, LCSW's note dated 03-22-15. 2. SW informed of the plan to discharge patient home today. Pt will need information in regards to resources for the patient and her child.  3. Dr. Elesa Massed informed of decision to discharge home.   Disposition: No evidence of imminent risk to self or others at present.   Patient does not meet criteria for psychiatric inpatient admission. Supportive therapy provided about ongoing stressors. Discussed crisis plan, support from social network, calling 911, coming to the Emergency Department, and calling Suicide Hotline.   Gena Fray 03/24/2015 11:38 AM

## 2015-03-24 NOTE — Progress Notes (Signed)
Antepartum Progress Note  Subjective: Doing ok, emotional, lonely, having anxiety about her pregnancy and her social situation.   +FM, no ctx, VB, or LOF   Physical Exam:  BP 115/61 mmHg  Pulse 82  Temp(Src) 97.7 F (36.5 C) (Oral)  Resp 20  SpO2 99%  LMP 06/29/2014 (Exact Date) GENERAL: Well-developed, well-nourished female in no acute distress.  CV: RRR PULM: NL effort, CTABL ABDOMEN: Soft, nontender, nondistended, gravid.  EXTREMITIES: Nontender, no edema Cervical Exam: deferred    Pertinent Labs/Studies:  Results for orders placed or performed during the hospital encounter of 03/22/15 (from the past 24 hour(s))  CBC     Status: None   Collection Time: 03/23/15 11:45 AM  Result Value Ref Range   WBC 7.6 3.6 - 11.0 K/uL   RBC 4.46 3.80 - 5.20 MIL/uL   Hemoglobin 13.3 12.0 - 16.0 g/dL   HCT 16.138.4 09.635.0 - 04.547.0 %   MCV 86.0 80.0 - 100.0 fL   MCH 29.9 26.0 - 34.0 pg   MCHC 34.7 32.0 - 36.0 g/dL   RDW 40.913.8 81.111.5 - 91.414.5 %   Platelets 178 150 - 440 K/uL   Dg Chest 1 View  03/23/2015  CLINICAL DATA:  Cough with sputum.  Patient is currently pregnant. EXAM: CHEST 1 VIEW COMPARISON:  None. FINDINGS: Borderline enlarged cardiac silhouette and mediastinal contours, potentially accentuated due to obliquity. No focal airspace opacities. No pleural effusion or pneumothorax. No evidence of edema. No acute osseus abnormalities. IMPRESSION: No acute cardiopulmonary disease. Specifically, no evidence of pneumonia. Electronically Signed   By: Simonne ComeJohn  Watts M.D.   On: 03/23/2015 14:48    Assessment : IUP at 38.1 weeks with suicidal ideation, poor social situation.  Plan:  1. SI:  Patient states she is not actually suicidal but her living situation draws her to the brink.  Her "baby daddy" is 2519 and per her does not grasp the responsibility necessary to take care of, or give attention to his 'baby mama" and future baby.  She had had a 1:1 sitter since admission.  Psych has seen her and  does not think she needs a sitter any longer. 2. Psych: please see consult by Dr. Jenne CampusMcQueen.  He will meet with patient and FOB tomorrow to further discuss/assess social situation.   3. Social: FOB came to visit and security was paged overhead to dissolve their encounter which became heated. Police were summoned. She would like him to stay overnight when he returns.  She states that she loves him, but to others she has stated that they are not romantically involved.  She describes her seeking attention from him as if they were romantically linked. 4. SW: see note, several resources were presented to patient  5. IUP: no issues at this time.  6. Latent TB: productive cough, CXR negative  7. Disposition: continue inpatient management as recommended by psych.  Safety is a concern at home, and at this time induction is not recommended to aid in resolution to this.  However, resources are unavailable to mom until baby is born due to immigration status.  Potential discharge tomorrow following psych evaluation.   ----- Ranae Plumberhelsea Kobie Matkins, MD Attending Obstetrician and Gynecologist [facility]

## 2015-03-24 NOTE — Discharge Summary (Signed)
Antenatal Physician Discharge Summary  Patient ID: Cheryl Fitzgerald MRN: [id number] DOB/AGE: Feb 14, 1996 18 y.o.  Admit date: 03/22/2015 Discharge date: 03/24/2015  Admission Diagnoses:  decreased fetal movement, lice, productive cough, and suicidal ideation  Discharge Diagnoses: productive cough, and poor social environment and social skills  Prenatal Procedures: NST, CXR  Consults: Psychiarty and Social Work  Significant Diagnostic Studies:  Results for orders placed or performed during the hospital encounter of 03/22/15 (from the past 168 hour(s))  Urine Drug Screen, Qualitative ([facility] only)   Collection Time: 03/22/15  5:03 PM  Result Value Ref Range   Tricyclic, Ur Screen NONE DETECTED NONE DETECTED   Amphetamines, Ur Screen NONE DETECTED NONE DETECTED   MDMA (Ecstasy)Ur Screen NONE DETECTED NONE DETECTED   Cocaine Metabolite,Ur [place] NONE DETECTED NONE DETECTED   Opiate, Ur Screen NONE DETECTED NONE DETECTED   Phencyclidine (PCP) Ur S NONE DETECTED NONE DETECTED   Cannabinoid 50 Ng, Ur [place] NONE DETECTED NONE DETECTED   Barbiturates, Ur Screen NONE DETECTED NONE DETECTED   Benzodiazepine, Ur Scrn NONE DETECTED NONE DETECTED   Methadone Scn, Ur NONE DETECTED NONE DETECTED  CBC   Collection Time: 03/23/15 11:45 AM  Result Value Ref Range   WBC 7.6 3.6 - 11.0 K/uL   RBC 4.46 3.80 - 5.20 MIL/uL   Hemoglobin 13.3 12.0 - 16.0 g/dL   HCT 82.9 56.2 - 13.0 %   MCV 86.0 80.0 - 100.0 fL   MCH 29.9 26.0 - 34.0 pg   MCHC 34.7 32.0 - 36.0 g/dL   RDW 86.5 78.4 - 69.6 %   Platelets 178 150 - 440 K/uL    Treatments: robutussin, Lice treatment  Hospital Course:  This is a 19 y.o. G2P0010 with IUP at [redacted]w[redacted]d admitted for the above diagnoses. On initial exam and monitoring she was not noted to have any contractions, and fetal movement resumed as normal upon admission.  No leaking of fluid and no bleeding. She was treated with shampoo for lice. Her EPDS score was 27, and she claimed suicidal  ideation.  She was placed on 1:1 observation and psychiatry and social work were consulted.  Recommendations were given to patient and FOB for support and resources.  After contracting for safety, she was deemed stable for discharge to home with outpatient follow up.  Discharge Exam: BP 109/43 mmHg  Pulse 100  Temp(Src) 98 F (36.7 C) (Oral)  Resp 18  SpO2 100%  General: NAD CV: RRR Pulm: CTABL, nl effort ABD: s/nd/nt, gravid DVT Evaluation: LE non-ttp, no evidence of DVT on exam. Pelvic exam deferred.   Discharge Condition: Stable  Disposition: 01-Home or Self Care     Medication List    STOP taking these medications        terconazole 0.8 % vaginal cream  Commonly known as:  TERAZOL 3      TAKE these medications        guaiFENesin 100 MG/5ML Soln  Commonly known as:  ROBITUSSIN  Take 5 mLs (100 mg total) by mouth every 4 (four) hours as needed for cough or to loosen phlegm.     multivitamin tablet  Take 1 tablet by mouth daily.       Follow-up Information    Follow up with [facility] Department.   Why:  keep next appointment   Contact information:   [address] [place] [address] [place] [address] [phone]       Signed:  ----- Ranae Plumber, MD Attending  Chemical engineerbstetrician and Gynecologist [facility]

## 2015-03-24 NOTE — Progress Notes (Signed)
Dr ward out here to see them and talked to them in depth ..discharged at [phone]

## 2015-03-24 NOTE — Progress Notes (Signed)
Starting to discharge pt at 1630 as she has been discharged by dr ward and her psych consult and sw  Has been completed.the patient doesn't think she can leave due to conditions at home..significant other asks if they go home and it isn't clean and bug free if they can come back to this room..they don't want to go home.Marland Kitchen.asked me to call dr ward to discuss this with her before they leave.Marland Kitchen.i also called kiara in social work as she was to give them money for a bug bomb before they leave. She gave her money last night for this and it was with her belongings.Marland Kitchen.[name] came up and talked to them and the patient stated she found the money. [name] talked to them in depth

## 2015-03-27 ENCOUNTER — Inpatient Hospital Stay
Admission: EM | Admit: 2015-03-27 | Discharge: 2015-03-27 | Disposition: A | Discharge status: Home / Self Care | Payer: Self-pay | Attending: Obstetrics and Gynecology | Admitting: Obstetrics and Gynecology

## 2015-03-27 ENCOUNTER — Encounter: Payer: Self-pay | Admitting: Emergency Medicine

## 2015-03-27 DIAGNOSIS — O0973 Supervision of high risk pregnancy due to social problems, third trimester: Secondary | ICD-10-CM

## 2015-03-27 DIAGNOSIS — Z591 Inadequate housing: Secondary | ICD-10-CM

## 2015-03-27 DIAGNOSIS — O368130 Decreased fetal movements, third trimester, not applicable or unspecified: Secondary | ICD-10-CM | POA: Insufficient documentation

## 2015-03-27 DIAGNOSIS — F329 Major depressive disorder, single episode, unspecified: Secondary | ICD-10-CM

## 2015-03-27 DIAGNOSIS — Z227 Latent tuberculosis: Secondary | ICD-10-CM

## 2015-03-27 DIAGNOSIS — O368190 Decreased fetal movements, unspecified trimester, not applicable or unspecified: Secondary | ICD-10-CM | POA: Diagnosis present

## 2015-03-27 DIAGNOSIS — Z3A38 38 weeks gestation of pregnancy: Secondary | ICD-10-CM | POA: Insufficient documentation

## 2015-03-27 DIAGNOSIS — T730XXD Starvation, subsequent encounter: Secondary | ICD-10-CM

## 2015-03-27 DIAGNOSIS — O99343 Other mental disorders complicating pregnancy, third trimester: Secondary | ICD-10-CM | POA: Insufficient documentation

## 2015-03-27 DIAGNOSIS — F32A Depression, unspecified: Secondary | ICD-10-CM

## 2015-03-27 DIAGNOSIS — F4329 Adjustment disorder with other symptoms: Secondary | ICD-10-CM

## 2015-03-27 MED ORDER — ACETAMINOPHEN 500 MG PO TABS
1000.0000 mg | ORAL_TABLET | Freq: Once | ORAL | Status: AC
  Administered 2015-03-27: 1000 mg via ORAL
  Filled 2015-03-27: qty 2

## 2015-03-27 NOTE — Discharge Instructions (Signed)
Braxton Hicks Contractions °Contractions of the uterus can occur throughout pregnancy. Contractions are not always a sign that you are in labor.  °WHAT ARE BRAXTON HICKS CONTRACTIONS?  °Contractions that occur before labor are called Braxton Hicks contractions, or false labor. Toward the end of pregnancy (32-34 weeks), these contractions can develop more often and may become more forceful. This is not true labor because these contractions do not result in opening (dilatation) and thinning of the cervix. They are sometimes difficult to tell apart from true labor because these contractions can be forceful and people have different pain tolerances. You should not feel embarrassed if you go to the hospital with false labor. Sometimes, the only way to tell if you are in true labor is for your health care provider to look for changes in the cervix. °If there are no prenatal problems or other health problems associated with the pregnancy, it is completely safe to be sent home with false labor and await the onset of true labor. °HOW CAN YOU TELL THE DIFFERENCE BETWEEN TRUE AND FALSE LABOR? °False Labor °· The contractions of false labor are usually shorter and not as hard as those of true labor.   °· The contractions are usually irregular.   °· The contractions are often felt in the front of the lower abdomen and in the groin.   °· The contractions may go away when you walk around or change positions while lying down.   °· The contractions get weaker and are shorter lasting as time goes on.   °· The contractions do not usually become progressively stronger, regular, and closer together as with true labor.   °True Labor °· Contractions in true labor last 30-70 seconds, become very regular, usually become more intense, and increase in frequency.   °· The contractions do not go away with walking.   °· The discomfort is usually felt in the top of the uterus and spreads to the lower abdomen and low back.   °· True labor can be  determined by your health care provider with an exam. This will show that the cervix is dilating and getting thinner.   °WHAT TO REMEMBER °· Keep up with your usual exercises and follow other instructions given by your health care provider.   °· Take medicines as directed by your health care provider.   °· Keep your regular prenatal appointments.   °· Eat and drink lightly if you think you are going into labor.   °· If Braxton Hicks contractions are making you uncomfortable:   °¨ Change your position from lying down or resting to walking, or from walking to resting.   °¨ Sit and rest in a tub of warm water.   °¨ Drink 2-3 glasses of water. Dehydration may cause these contractions.   °¨ Do slow and deep breathing several times an hour.   °WHEN SHOULD I SEEK IMMEDIATE MEDICAL CARE? °Seek immediate medical care if: °· Your contractions become stronger, more regular, and closer together.   °· You have fluid leaking or gushing from your vagina.   °· You have a fever.   °· You pass blood-tinged mucus.   °· You have vaginal bleeding.   °· You have continuous abdominal pain.   °· You have low back pain that you never had before.   °· You feel your baby's head pushing down and causing pelvic pressure.   °· Your baby is not moving as much as it used to.   °  °This information is not intended to replace advice given to you by your health care provider. Make sure you discuss any questions you have with your health care   provider. °  °Document Released: 05/25/2005 Document Revised: 05/30/2013 Document Reviewed: 03/06/2013 °Elsevier Interactive Patient Education ©2016 [employer] ° °

## 2015-03-27 NOTE — ED Notes (Addendum)
Pt presents to ED [redacted] weeks pregnant and expecting Friday. with recent visit to ED this past weekend , pt states she was here for depression during pregnancy because baby wasn't moving and relationship issues.Pt concerned about housing issues. Pt states she feels down because the last "kick" she felt was around 0600 this morning. Pt anxious because the baby started moving after she ate and drank fluids. Pt anxious because "all i want to do is have a healthy birth". Concerned about baby's well being. At this moment, pt denies SI/HI thoughts. Yesterday she had thoughts of SI due to argument with baby's father but felt better after rest. Pt states she feels humiliated and bullied in current relationship and housing situation. Pt has history of depression prior to pregnancy.
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
## 2015-04-05 ENCOUNTER — Encounter: Payer: Self-pay | Admitting: *Deleted

## 2015-04-05 ENCOUNTER — Inpatient Hospital Stay
Admission: EM | Admit: 2015-04-05 | Discharge: 2015-04-06 | DRG: 775 | Disposition: A | Discharge status: Skilled Nursing Facility | Payer: Self-pay | Attending: Obstetrics and Gynecology | Admitting: Obstetrics and Gynecology

## 2015-04-05 DIAGNOSIS — O99343 Other mental disorders complicating pregnancy, third trimester: Secondary | ICD-10-CM

## 2015-04-05 DIAGNOSIS — F4329 Adjustment disorder with other symptoms: Secondary | ICD-10-CM

## 2015-04-05 DIAGNOSIS — Z3A41 41 weeks gestation of pregnancy: Secondary | ICD-10-CM

## 2015-04-05 DIAGNOSIS — Z227 Latent tuberculosis: Secondary | ICD-10-CM

## 2015-04-05 DIAGNOSIS — F329 Major depressive disorder, single episode, unspecified: Secondary | ICD-10-CM | POA: Diagnosis present

## 2015-04-05 DIAGNOSIS — O480 Post-term pregnancy: Secondary | ICD-10-CM | POA: Diagnosis present

## 2015-04-05 DIAGNOSIS — T730XXD Starvation, subsequent encounter: Secondary | ICD-10-CM

## 2015-04-05 DIAGNOSIS — O99344 Other mental disorders complicating childbirth: Secondary | ICD-10-CM | POA: Diagnosis present

## 2015-04-05 DIAGNOSIS — O99824 Streptococcus B carrier state complicating childbirth: Principal | ICD-10-CM | POA: Diagnosis present

## 2015-04-05 DIAGNOSIS — O0973 Supervision of high risk pregnancy due to social problems, third trimester: Secondary | ICD-10-CM

## 2015-04-05 DIAGNOSIS — R059 Cough, unspecified: Secondary | ICD-10-CM

## 2015-04-05 DIAGNOSIS — R05 Cough: Secondary | ICD-10-CM

## 2015-04-05 DIAGNOSIS — Z591 Inadequate housing: Secondary | ICD-10-CM

## 2015-04-05 LAB — URINE DRUG SCREEN, QUALITATIVE (ARMC ONLY)
AMPHETAMINES, UR SCREEN: NOT DETECTED
BENZODIAZEPINE, UR SCRN: NOT DETECTED
Barbiturates, Ur Screen: NOT DETECTED
Cannabinoid 50 Ng, Ur ~~LOC~~: NOT DETECTED
Cocaine Metabolite,Ur ~~LOC~~: NOT DETECTED
MDMA (ECSTASY) UR SCREEN: NOT DETECTED
METHADONE SCREEN, URINE: NOT DETECTED
OPIATE, UR SCREEN: NOT DETECTED
Phencyclidine (PCP) Ur S: NOT DETECTED
Tricyclic, Ur Screen: NOT DETECTED

## 2015-04-05 LAB — TYPE AND SCREEN
ABO/RH(D): A POS
Antibody Screen: NEGATIVE

## 2015-04-05 LAB — CBC
HCT: 39.8 % (ref 35.0–47.0)
Hemoglobin: 13.4 g/dL (ref 12.0–16.0)
MCH: 29 pg (ref 26.0–34.0)
MCHC: 33.6 g/dL (ref 32.0–36.0)
MCV: 86.1 fL (ref 80.0–100.0)
PLATELETS: 205 10*3/uL (ref 150–440)
RBC: 4.61 MIL/uL (ref 3.80–5.20)
RDW: 14.1 % (ref 11.5–14.5)
WBC: 9.4 10*3/uL (ref 3.6–11.0)

## 2015-04-05 MED ORDER — FENTANYL CITRATE (PF) 100 MCG/2ML IJ SOLN: 50.0000 ug | INTRAMUSCULAR | Status: DC | PRN

## 2015-04-05 MED ORDER — PERMETHRIN 5 % EX CREA
TOPICAL_CREAM | Freq: Once | CUTANEOUS | Status: DC
Start: 2015-04-05 — End: 2015-04-06
  Filled 2015-04-05: qty 60

## 2015-04-05 MED ORDER — LACTATED RINGERS IV SOLN: 500.0000 mL | INTRAVENOUS | Status: DC | PRN

## 2015-04-05 MED ORDER — ZOLPIDEM TARTRATE 5 MG PO TABS: 5.0000 mg | ORAL_TABLET | Freq: Every evening | ORAL | Status: DC | PRN

## 2015-04-05 MED ORDER — LIDOCAINE HCL (PF) 1 % IJ SOLN
INTRAMUSCULAR | Status: AC
  Filled 2015-04-05: qty 30

## 2015-04-05 MED ORDER — OXYTOCIN 10 UNIT/ML IJ SOLN
INTRAMUSCULAR | Status: AC
  Filled 2015-04-05: qty 2

## 2015-04-05 MED ORDER — MISOPROSTOL 200 MCG PO TABS
ORAL_TABLET | ORAL | Status: AC
  Filled 2015-04-05: qty 4

## 2015-04-05 MED ORDER — AMMONIA AROMATIC IN INHA
RESPIRATORY_TRACT | Status: AC
  Filled 2015-04-05: qty 10

## 2015-04-05 MED ORDER — ONDANSETRON HCL 4 MG/2ML IJ SOLN: 4.0000 mg | Freq: Four times a day (QID) | INTRAMUSCULAR | Status: DC | PRN

## 2015-04-05 MED ORDER — ACETAMINOPHEN 325 MG PO TABS: 650.0000 mg | ORAL_TABLET | ORAL | Status: DC | PRN

## 2015-04-05 MED ORDER — MENTHOL 3 MG MT LOZG
1.0000 | LOZENGE | OROMUCOSAL | Status: DC | PRN
  Filled 2015-04-05: qty 9

## 2015-04-05 MED ORDER — LIDOCAINE HCL (PF) 1 % IJ SOLN: 30.0000 mL | INTRAMUSCULAR | Status: DC | PRN

## 2015-04-05 MED ORDER — OXYTOCIN BOLUS FROM INFUSION: 500.0000 mL | INTRAVENOUS | Status: DC

## 2015-04-05 MED ORDER — CITRIC ACID-SODIUM CITRATE 334-500 MG/5ML PO SOLN: 30.0000 mL | ORAL | Status: DC | PRN

## 2015-04-05 MED ORDER — PERMETHRIN 1 % EX LOTN
TOPICAL_LOTION | Freq: Once | CUTANEOUS | Status: DC
  Filled 2015-04-05: qty 59

## 2015-04-05 MED ORDER — OXYTOCIN 40 UNITS IN LACTATED RINGERS INFUSION - SIMPLE MED
62.5000 mL/h | INTRAVENOUS | Status: DC
  Filled 2015-04-05: qty 1000

## 2015-04-05 MED ORDER — LACTATED RINGERS IV SOLN: INTRAVENOUS | Status: DC
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
## 2015-04-05 NOTE — Discharge Summary (Signed)
DC Summary  19 y/o G2P0010 @ 41/0 (EDC 10/28, dated by 12wk u/s) admitted for PDIOL. H/o +PPD, negative CXR but only took rifampin x 5062m and has had productive cough x 5715m and night chills. Case d/w IM and they recommend labor room with negative pressure. [facility]. Fetus reactive, irregular UCs Afebrile, VS normal and stable  Cheryl Fitzgerald, Jr MD [facility]  Pager: [phone]

## 2015-04-05 NOTE — Progress Notes (Signed)
OB Note Case d/w Hospitalist service and they recommend negative pressure room for hospitalization, which we unfortunately don't have on L&D. [facility] have [facility] and space and take her. Plan d/w pt and family and they are okay for transfer. Droplet precautions put into place. Will cancel CXR  [name] Daren Doswell, Jr MD [facility]  Pager: [phone]

## 2015-04-06 ENCOUNTER — Inpatient Hospital Stay (HOSPITAL_COMMUNITY)
Admission: AD | Admit: 2015-04-06 | Discharge: 2015-04-09 | DRG: 775 | Disposition: A | Discharge status: Home / Self Care | Payer: Medicaid Other | Source: Ambulatory Visit | Attending: Obstetrics and Gynecology | Admitting: Obstetrics and Gynecology

## 2015-04-06 ENCOUNTER — Inpatient Hospital Stay (HOSPITAL_COMMUNITY): Payer: Medicaid Other | Admitting: Anesthesiology

## 2015-04-06 ENCOUNTER — Encounter (HOSPITAL_COMMUNITY): Payer: Self-pay | Admitting: *Deleted

## 2015-04-06 DIAGNOSIS — F1290 Cannabis use, unspecified, uncomplicated: Secondary | ICD-10-CM | POA: Diagnosis present

## 2015-04-06 DIAGNOSIS — Z3A41 41 weeks gestation of pregnancy: Secondary | ICD-10-CM

## 2015-04-06 DIAGNOSIS — F329 Major depressive disorder, single episode, unspecified: Secondary | ICD-10-CM | POA: Diagnosis present

## 2015-04-06 DIAGNOSIS — Z591 Inadequate housing, unspecified: Secondary | ICD-10-CM

## 2015-04-06 DIAGNOSIS — O99824 Streptococcus B carrier state complicating childbirth: Secondary | ICD-10-CM | POA: Diagnosis present

## 2015-04-06 DIAGNOSIS — F4329 Adjustment disorder with other symptoms: Secondary | ICD-10-CM

## 2015-04-06 DIAGNOSIS — Z87891 Personal history of nicotine dependence: Secondary | ICD-10-CM

## 2015-04-06 DIAGNOSIS — O0973 Supervision of high risk pregnancy due to social problems, third trimester: Secondary | ICD-10-CM

## 2015-04-06 DIAGNOSIS — O480 Post-term pregnancy: Secondary | ICD-10-CM | POA: Diagnosis present

## 2015-04-06 DIAGNOSIS — F4323 Adjustment disorder with mixed anxiety and depressed mood: Secondary | ICD-10-CM | POA: Diagnosis present

## 2015-04-06 DIAGNOSIS — O99324 Drug use complicating childbirth: Secondary | ICD-10-CM | POA: Diagnosis present

## 2015-04-06 DIAGNOSIS — Z227 Latent tuberculosis: Secondary | ICD-10-CM

## 2015-04-06 DIAGNOSIS — O99344 Other mental disorders complicating childbirth: Secondary | ICD-10-CM | POA: Diagnosis present

## 2015-04-06 DIAGNOSIS — T730XXD Starvation, subsequent encounter: Secondary | ICD-10-CM

## 2015-04-06 DIAGNOSIS — R7611 Nonspecific reaction to tuberculin skin test without active tuberculosis: Secondary | ICD-10-CM | POA: Diagnosis present

## 2015-04-06 DIAGNOSIS — F32A Depression, unspecified: Secondary | ICD-10-CM

## 2015-04-06 DIAGNOSIS — O99343 Other mental disorders complicating pregnancy, third trimester: Secondary | ICD-10-CM

## 2015-04-06 DIAGNOSIS — T730XXA Starvation, initial encounter: Secondary | ICD-10-CM | POA: Diagnosis present

## 2015-04-06 LAB — ABO/RH
ABO/RH(D): A POS
ABO/RH(D): A POS

## 2015-04-06 LAB — RAPID HIV SCREEN (HIV 1/2 AB+AG)
HIV 1/2 Antibodies: NONREACTIVE
HIV-1 P24 ANTIGEN - HIV24: NONREACTIVE

## 2015-04-06 LAB — TYPE AND SCREEN
ABO/RH(D): A POS
Antibody Screen: NEGATIVE

## 2015-04-06 MED ORDER — DIPHENHYDRAMINE HCL 50 MG/ML IJ SOLN: 12.5000 mg | INTRAMUSCULAR | Status: DC | PRN

## 2015-04-06 MED ORDER — FENTANYL CITRATE (PF) 100 MCG/2ML IJ SOLN
100.0000 ug | INTRAMUSCULAR | Status: DC | PRN
  Administered 2015-04-06 (×2): 100 ug via INTRAVENOUS
  Filled 2015-04-06 (×2): qty 2

## 2015-04-06 MED ORDER — OXYTOCIN 40 UNITS IN LACTATED RINGERS INFUSION - SIMPLE MED
1.0000 m[IU]/min | INTRAVENOUS | Status: DC
  Administered 2015-04-06: 2 m[IU]/min via INTRAVENOUS
  Filled 2015-04-06: qty 1000

## 2015-04-06 MED ORDER — PENICILLIN G POTASSIUM 5000000 UNITS IJ SOLR
5.0000 10*6.[IU] | Freq: Once | INTRAMUSCULAR | Status: AC
  Administered 2015-04-06: 5 10*6.[IU] via INTRAVENOUS
  Filled 2015-04-06: qty 5

## 2015-04-06 MED ORDER — MISOPROSTOL 200 MCG PO TABS
50.0000 ug | ORAL_TABLET | ORAL | Status: DC
  Administered 2015-04-06: 50 ug via ORAL
  Filled 2015-04-06: qty 0.5

## 2015-04-06 MED ORDER — PHENYLEPHRINE 40 MCG/ML (10ML) SYRINGE FOR IV PUSH (FOR BLOOD PRESSURE SUPPORT)
80.0000 ug | PREFILLED_SYRINGE | INTRAVENOUS | Status: DC | PRN
  Filled 2015-04-06: qty 20
  Filled 2015-04-06: qty 2

## 2015-04-06 MED ORDER — EPHEDRINE 5 MG/ML INJ
10.0000 mg | INTRAVENOUS | Status: DC | PRN
  Filled 2015-04-06: qty 2

## 2015-04-06 MED ORDER — BUPIVACAINE HCL (PF) 0.25 % IJ SOLN
INTRAMUSCULAR | Status: DC | PRN
  Administered 2015-04-06 (×2): 4 mL via EPIDURAL

## 2015-04-06 MED ORDER — LORAZEPAM 2 MG/ML IJ SOLN
1.0000 mg | Freq: Once | INTRAMUSCULAR | Status: AC
  Administered 2015-04-06: 1 mg via INTRAVENOUS

## 2015-04-06 MED ORDER — LIDOCAINE-EPINEPHRINE (PF) 2 %-1:200000 IJ SOLN
INTRAMUSCULAR | Status: DC | PRN
  Administered 2015-04-06: 3 mL

## 2015-04-06 MED ORDER — OXYCODONE-ACETAMINOPHEN 5-325 MG PO TABS: 1.0000 | ORAL_TABLET | ORAL | Status: DC | PRN

## 2015-04-06 MED ORDER — FLEET ENEMA 7-19 GM/118ML RE ENEM: 1.0000 | ENEMA | RECTAL | Status: DC | PRN

## 2015-04-06 MED ORDER — FENTANYL 2.5 MCG/ML BUPIVACAINE 1/10 % EPIDURAL INFUSION (WH - ANES)
14.0000 mL/h | INTRAMUSCULAR | Status: DC | PRN
  Administered 2015-04-06: 12 mL/h via EPIDURAL
  Administered 2015-04-07: 14 mL/h via EPIDURAL
  Filled 2015-04-06 (×2): qty 125

## 2015-04-06 MED ORDER — TERBUTALINE SULFATE 1 MG/ML IJ SOLN
0.2500 mg | Freq: Once | INTRAMUSCULAR | Status: DC | PRN
  Filled 2015-04-06: qty 1

## 2015-04-06 MED ORDER — CITRIC ACID-SODIUM CITRATE 334-500 MG/5ML PO SOLN: 30.0000 mL | ORAL | Status: DC | PRN

## 2015-04-06 MED ORDER — PENICILLIN G POTASSIUM 5000000 UNITS IJ SOLR
2.5000 10*6.[IU] | INTRAMUSCULAR | Status: DC
  Administered 2015-04-07 (×3): 2.5 10*6.[IU] via INTRAVENOUS
  Filled 2015-04-06 (×6): qty 2.5

## 2015-04-06 MED ORDER — OXYCODONE-ACETAMINOPHEN 5-325 MG PO TABS
2.0000 | ORAL_TABLET | ORAL | Status: DC | PRN
  Administered 2015-04-07: 2 via ORAL
  Filled 2015-04-06: qty 2

## 2015-04-06 MED ORDER — OXYTOCIN BOLUS FROM INFUSION: 500.0000 mL | INTRAVENOUS | Status: DC

## 2015-04-06 MED ORDER — LIDOCAINE HCL (PF) 1 % IJ SOLN
30.0000 mL | INTRAMUSCULAR | Status: DC | PRN
Start: 2015-04-06 — End: 2015-04-07
  Filled 2015-04-06: qty 30

## 2015-04-06 MED ORDER — PERMETHRIN 5 % EX CREA
TOPICAL_CREAM | Freq: Once | CUTANEOUS | Status: AC
  Administered 2015-04-06: 11:00:00 via TOPICAL
  Filled 2015-04-06: qty 60

## 2015-04-06 MED ORDER — ONDANSETRON HCL 4 MG/2ML IJ SOLN
4.0000 mg | Freq: Four times a day (QID) | INTRAMUSCULAR | Status: DC | PRN
  Administered 2015-04-07: 4 mg via INTRAVENOUS
  Filled 2015-04-06: qty 2

## 2015-04-06 MED ORDER — LACTATED RINGERS IV SOLN
INTRAVENOUS | Status: DC
  Administered 2015-04-06 – 2015-04-07 (×4): via INTRAVENOUS

## 2015-04-06 MED ORDER — LACTATED RINGERS IV SOLN
500.0000 mL | INTRAVENOUS | Status: DC | PRN
  Administered 2015-04-06: 500 mL via INTRAVENOUS
  Administered 2015-04-07: 200 mL via INTRAVENOUS

## 2015-04-06 MED ORDER — LORAZEPAM 2 MG/ML IJ SOLN: 1.0000 mg | Freq: Once | INTRAMUSCULAR | Status: DC

## 2015-04-06 MED ORDER — OXYTOCIN 40 UNITS IN LACTATED RINGERS INFUSION - SIMPLE MED: 62.5000 mL/h | INTRAVENOUS | Status: DC

## 2015-04-06 MED ORDER — PERMETHRIN 5 % EX CREA
TOPICAL_CREAM | Freq: Once | CUTANEOUS | Status: AC
  Administered 2015-04-06: 09:00:00 via TOPICAL
  Filled 2015-04-06: qty 60

## 2015-04-06 MED ORDER — GUAIFENESIN 100 MG/5ML PO SOLN: 5.0000 mL | ORAL | Status: DC | PRN

## 2015-04-06 MED ORDER — ACETAMINOPHEN 325 MG PO TABS
650.0000 mg | ORAL_TABLET | ORAL | Status: DC | PRN
  Administered 2015-04-06: 650 mg via ORAL
  Filled 2015-04-06: qty 2

## 2015-04-06 NOTE — Progress Notes (Signed)
Interpreter at bedside to portray importance to family members about also being treated for lice. Including hair, changing linens, brushes, etc. Discussed importance of treatment so the baby can be brought home in a lice free environment. Stated understanding and plans to get treatment and also treat themselves, today.

## 2015-04-06 NOTE — Progress Notes (Signed)
Patient ID: Cheryl RubensteinKarin L Fitzgerald, female   DOB: 1995/07/15, 19 y.o.   MRN: [id number] Cheryl RubensteinKarin L Fitzgerald is a 19 y.o. G2P0010 at 8159w1d admitted for induction of labor due to Post dates. Due date 10/21. H/O +PPD so in negative pressure room. Headlice- has had multiple treatments with permethrin this am d/t length of hair  Subjective: Some cramping w/ cytotec  Objective: BP 118/68 mmHg  Pulse 92  Temp(Src) 98.2 F (36.8 C) (Oral)  Resp 18  Ht 5\' 6"  (1.676 m)  Wt 88.905 kg (196 lb)  BMI 31.65 kg/m2  LMP 06/29/2014 (Exact Date)    FHT:  FHR: 135 bpm, variability: moderate,  accelerations:  Present,  decelerations:  Absent UC:   irregular  SVE:  1.5/80/-2, vtx Cervical foley bulb inserted and inflated w/ 60ml LR w/o difficulty   Labs: Lab Results  Component Value Date   WBC 9.4 04/05/2015   HGB 13.4 04/05/2015   HCT 39.8 04/05/2015   MCV 86.1 04/05/2015   PLT 205 04/05/2015    Assessment / Plan: IOL d/t postdates, s/p cytotec, now has cervical foley bulb- will plan for pitocin per protocol once it falls out  Labor: n/a Fetal Wellbeing:  Category I Pain Control:  Labor support without medications Pre-eclampsia: n/a I/D:  pcn for gbs+ w/ labor Anticipated MOD:  NSVD  Marge DuncansBooker, Kimberly Randall CNM, WHNP-BC 04/06/2015, 1:33 PM

## 2015-04-06 NOTE — Anesthesia Procedure Notes (Signed)
Epidural Patient location during procedure: OB  Staffing Anesthesiologist: Savannah Morford Performed by: anesthesiologist   Preanesthetic Checklist Completed: patient identified, surgical consent, pre-op evaluation, timeout performed, IV checked, risks and benefits discussed and monitors and equipment checked  Epidural Patient position: sitting Prep: DuraPrep Patient monitoring: heart rate, cardiac monitor, continuous pulse ox and blood pressure Approach: midline Location: L3-L4 Injection technique: LOR saline  Needle:  Needle type: Tuohy  Needle gauge: 17 G Needle length: 9 cm Needle insertion depth: 7 cm Catheter type: closed end flexible Catheter size: 19 Gauge Catheter at skin depth: 13 cm Test dose: negative and 2% lidocaine with Epi 1:200 K  Assessment Events: blood not aspirated, injection not painful, no injection resistance, negative IV test and no paresthesia  Additional Notes Reason for block:procedure for pain   

## 2015-04-06 NOTE — Progress Notes (Signed)
Labor Progress Note  Cheryl Fitzgerald is a 19 y.o. G2P0010 at 7162w1d  admitted for induction of labor due to Post dates. Due date 03/29/15.  S: Patient states pain is well controlled with epidural in place. She feels her contractions as pressure. Still has some itching of her scalp after 2x permethrin treatments.    O:  BP 88/42 mmHg  Pulse 101  Temp(Src) 97.6 F (36.4 C) (Axillary)  Resp 18  Ht 5\' 6"  (1.676 m)  Wt 88.905 kg (196 lb)  BMI 31.65 kg/m2  SpO2 100%  LMP 06/29/2014 (Exact Date)  Total I/O In: -  Out: 2000 [Urine:2000]  FHT:  FHR: 135 bpm, variability: moderate,  accelerations:  Present,  decelerations:  Absent UC:   regular, every 1 minutes SVE:   Dilation: 5 Effacement (%): 50 Station: -2 Exam by:: Dr. Audree BaneHillary F. SROM/AROM: Intact  Pitocin @ 6 mu/min  Labs: Lab Results  Component Value Date   WBC 9.4 04/05/2015   HGB 13.4 04/05/2015   HCT 39.8 04/05/2015   MCV 86.1 04/05/2015   PLT 205 04/05/2015    Assessment / Plan: 19 y.o. G2P0010 2062w1d in early labor Induction of labor due to post dates,  progressing well on pitocin  Labor: Progressing on Pitocin, will continue to increase then AROM Fetal Wellbeing:  Category I Pain Control:  Epidural Anticipated MOD:  NSVD  Expectant management  Cheryl GobbleHillary Feiga Nadel, Fitzgerald [facility], PGY-1

## 2015-04-06 NOTE — Progress Notes (Signed)
Patient ID: Leanor RubensteinKarin L Slivinski, female   DOB: 1995/12/24, 19 y.o.   MRN: [id number] Leanor RubensteinKarin L Rosenfield is a 19 y.o. G2P0010 at 1876w1d admitted for induction of labor due to Post dates. Due date 10/21.  Subjective: Some cramping, wants to eat  Objective: BP 115/72 mmHg  Pulse 113  Temp(Src) 98.1 F (36.7 C) (Oral)  Resp 18  Ht 5\' 6"  (1.676 m)  Wt 88.905 kg (196 lb)  BMI 31.65 kg/m2  LMP 06/29/2014 (Exact Date)    FHT:  FHR: 135 bpm, variability: moderate,  accelerations:  Present,  decelerations:  Absent UC:   irrregular  SVE:   Dilation: 4.5 Effacement (%): 70 Station: -2 Exam by:: h stone rnc  Notified by RN foley bulb fell out  Labs: Lab Results  Component Value Date   WBC 9.4 04/05/2015   HGB 13.4 04/05/2015   HCT 39.8 04/05/2015   MCV 86.1 04/05/2015   PLT 205 04/05/2015    Assessment / Plan: IOL d/t postdates, s/p cytotec and foley bulb- pt requests to eat, then will start pitocin per protocol  Labor: n/a Fetal Wellbeing:  Category I Pain Control:  Labor support without medications Pre-eclampsia: n/a I/D:  pcn for gbs+ when pitocin starts Anticipated MOD:  NSVD  Marge DuncansBooker, Kimberly Randall CNM, WHNP-BC 04/06/2015, 5:49 PM

## 2015-04-06 NOTE — Progress Notes (Signed)
Pt crying. Saying, " I can't do this". Ativan 1mg  iv.

## 2015-04-06 NOTE — Anesthesia Preprocedure Evaluation (Signed)
Anesthesia Evaluation  Patient identified by MRN, date of birth, ID band Patient awake    Reviewed: Allergy & Precautions, NPO status , Patient's Chart, lab work & pertinent test results  History of Anesthesia Complications Negative for: history of anesthetic complications  Airway Mallampati: II  TM Distance: >3 FB Neck ROM: Full    Dental no notable dental hx.    Pulmonary former smoker,  Possible TB   breath sounds clear to auscultation       Cardiovascular negative cardio ROS   Rhythm:Regular     Neuro/Psych PSYCHIATRIC DISORDERS Depression negative neurological ROS     GI/Hepatic negative GI ROS, Neg liver ROS,   Endo/Other  negative endocrine ROS  Renal/GU negative Renal ROS     Musculoskeletal   Abdominal   Peds  Hematology negative hematology ROS (+)   Anesthesia Other Findings lice  Reproductive/Obstetrics (+) Pregnancy                             Anesthesia Physical Anesthesia Plan  ASA: III  Anesthesia Plan: Epidural   Post-op Pain Management:    Induction:   Airway Management Planned:   Additional Equipment:   Intra-op Plan:   Post-operative Plan:   Informed Consent: I have reviewed the patients History and Physical, chart, labs and discussed the procedure including the risks, benefits and alternatives for the proposed anesthesia with the patient or authorized representative who has indicated his/her understanding and acceptance.   Dental advisory given  Plan Discussed with: Anesthesiologist  Anesthesia Plan Comments:         Anesthesia Quick Evaluation
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
## 2015-04-07 ENCOUNTER — Encounter (HOSPITAL_COMMUNITY): Payer: Self-pay

## 2015-04-07 DIAGNOSIS — Z3A41 41 weeks gestation of pregnancy: Secondary | ICD-10-CM

## 2015-04-07 DIAGNOSIS — T730XXA Starvation, initial encounter: Secondary | ICD-10-CM

## 2015-04-07 DIAGNOSIS — O99324 Drug use complicating childbirth: Secondary | ICD-10-CM

## 2015-04-07 DIAGNOSIS — O99824 Streptococcus B carrier state complicating childbirth: Secondary | ICD-10-CM

## 2015-04-07 DIAGNOSIS — O480 Post-term pregnancy: Secondary | ICD-10-CM

## 2015-04-07 DIAGNOSIS — F329 Major depressive disorder, single episode, unspecified: Secondary | ICD-10-CM

## 2015-04-07 DIAGNOSIS — O99344 Other mental disorders complicating childbirth: Secondary | ICD-10-CM

## 2015-04-07 DIAGNOSIS — F4323 Adjustment disorder with mixed anxiety and depressed mood: Secondary | ICD-10-CM

## 2015-04-07 DIAGNOSIS — Z87891 Personal history of nicotine dependence: Secondary | ICD-10-CM

## 2015-04-07 LAB — RUBELLA SCREEN: Rubella: 1.91 index (ref >0.99)

## 2015-04-07 LAB — RPR: RPR: NONREACTIVE

## 2015-04-07 MED ORDER — VITAMIN K1 1 MG/0.5ML IJ SOLN
INTRAMUSCULAR | Status: AC
  Filled 2015-04-07: qty 0.5

## 2015-04-07 MED ORDER — ONDANSETRON HCL 4 MG PO TABS: 4.0000 mg | ORAL_TABLET | ORAL | Status: DC | PRN

## 2015-04-07 MED ORDER — SIMETHICONE 80 MG PO CHEW: 80.0000 mg | CHEWABLE_TABLET | ORAL | Status: DC | PRN

## 2015-04-07 MED ORDER — WITCH HAZEL-GLYCERIN EX PADS: 1.0000 "application " | MEDICATED_PAD | CUTANEOUS | Status: DC | PRN

## 2015-04-07 MED ORDER — ACETAMINOPHEN 325 MG PO TABS: 650.0000 mg | ORAL_TABLET | ORAL | Status: DC | PRN

## 2015-04-07 MED ORDER — LANOLIN HYDROUS EX OINT: TOPICAL_OINTMENT | CUTANEOUS | Status: DC | PRN

## 2015-04-07 MED ORDER — MISOPROSTOL 200 MCG PO TABS
1000.0000 ug | ORAL_TABLET | Freq: Once | ORAL | Status: AC
  Administered 2015-04-07: 1000 ug via RECTAL

## 2015-04-07 MED ORDER — PRENATAL MULTIVITAMIN CH
1.0000 | ORAL_TABLET | Freq: Every day | ORAL | Status: DC
  Administered 2015-04-07 – 2015-04-09 (×3): 1 via ORAL
  Filled 2015-04-07 (×3): qty 1

## 2015-04-07 MED ORDER — ZOLPIDEM TARTRATE 5 MG PO TABS: 5.0000 mg | ORAL_TABLET | Freq: Every evening | ORAL | Status: DC | PRN

## 2015-04-07 MED ORDER — METHYLERGONOVINE MALEATE 0.2 MG/ML IJ SOLN
0.2000 mg | Freq: Once | INTRAMUSCULAR | Status: AC
  Administered 2015-04-07: 0.2 mg via INTRAMUSCULAR

## 2015-04-07 MED ORDER — OXYCODONE-ACETAMINOPHEN 5-325 MG PO TABS: 2.0000 | ORAL_TABLET | ORAL | Status: DC | PRN

## 2015-04-07 MED ORDER — MISOPROSTOL 200 MCG PO TABS
ORAL_TABLET | ORAL | Status: AC
  Filled 2015-04-07: qty 5

## 2015-04-07 MED ORDER — DIPHENHYDRAMINE HCL 25 MG PO CAPS: 25.0000 mg | ORAL_CAPSULE | Freq: Four times a day (QID) | ORAL | Status: DC | PRN

## 2015-04-07 MED ORDER — SENNOSIDES-DOCUSATE SODIUM 8.6-50 MG PO TABS
2.0000 | ORAL_TABLET | ORAL | Status: DC
  Administered 2015-04-07 – 2015-04-09 (×2): 2 via ORAL
  Filled 2015-04-07 (×2): qty 2

## 2015-04-07 MED ORDER — IBUPROFEN 600 MG PO TABS
600.0000 mg | ORAL_TABLET | Freq: Four times a day (QID) | ORAL | Status: DC
  Administered 2015-04-07 – 2015-04-09 (×8): 600 mg via ORAL
  Filled 2015-04-07 (×8): qty 1

## 2015-04-07 MED ORDER — BENZOCAINE-MENTHOL 20-0.5 % EX AERO
1.0000 "application " | INHALATION_SPRAY | CUTANEOUS | Status: DC | PRN
  Filled 2015-04-07: qty 56

## 2015-04-07 MED ORDER — OXYCODONE-ACETAMINOPHEN 5-325 MG PO TABS
1.0000 | ORAL_TABLET | ORAL | Status: DC | PRN
  Administered 2015-04-09: 1 via ORAL
  Filled 2015-04-07: qty 1

## 2015-04-07 MED ORDER — PNEUMOCOCCAL VAC POLYVALENT 25 MCG/0.5ML IJ INJ
0.5000 mL | INJECTION | INTRAMUSCULAR | Status: AC
  Administered 2015-04-09: 0.5 mL via INTRAMUSCULAR
  Filled 2015-04-07: qty 0.5

## 2015-04-07 MED ORDER — TETANUS-DIPHTH-ACELL PERTUSSIS 5-2.5-18.5 LF-MCG/0.5 IM SUSP
0.5000 mL | Freq: Once | INTRAMUSCULAR | Status: AC
  Administered 2015-04-08: 0.5 mL via INTRAMUSCULAR
  Filled 2015-04-07: qty 0.5

## 2015-04-07 MED ORDER — DIBUCAINE 1 % RE OINT: 1.0000 "application " | TOPICAL_OINTMENT | RECTAL | Status: DC | PRN

## 2015-04-07 MED ORDER — METHYLERGONOVINE MALEATE 0.2 MG/ML IJ SOLN
INTRAMUSCULAR | Status: AC
  Filled 2015-04-07: qty 1

## 2015-04-07 MED ORDER — ONDANSETRON HCL 4 MG/2ML IJ SOLN: 4.0000 mg | INTRAMUSCULAR | Status: DC | PRN

## 2015-04-07 NOTE — Anesthesia Postprocedure Evaluation (Signed)
  Anesthesia Post-op Note  Patient: Cheryl RubensteinKarin L Fitzgerald  Procedure(s) Performed: * No procedures listed *  Patient Location: PACU and Mother/Baby  Anesthesia Type:Epidural  Level of Consciousness: awake, alert  and oriented  Airway and Oxygen Therapy: Patient Spontanous Breathing  Post-op Pain: mild  Post-op Assessment: Post-op Vital signs reviewed              Post-op Vital Signs: Reviewed and stable  Last Vitals:  Filed Vitals:   04/07/15 1635  BP: 101/49  Pulse: 94  Temp: 36.8 C  Resp: 18    Complications: No apparent anesthesia complications

## 2015-04-07 NOTE — Progress Notes (Signed)
Fluid bolus administered. BP to be re-assessed.

## 2015-04-07 NOTE — Progress Notes (Signed)
Update report provided and care relinquished to WausaJohnette, [place]RN.

## 2015-04-07 NOTE — Progress Notes (Signed)
RN at bedside, apical pulse assessed. Maternal HR 120. Will adjust Pulse ox.

## 2015-04-07 NOTE — Lactation Note (Signed)
This note was copied from the chart of Cheryl Fitzgerald Poma. Lactation Consultation Note Follow up at 11 hours of age.  Baby has been made a nursery baby and separated from Mom and FOB due to possible TB exposure and per the recommendation of peds and consult peds.  MBU RN and LC are not aware of current anti-tubucular medication at this time. According the [facility] "Medications & Mother's Milk" book if mom is ordered Isoniazid/Rimifon, as discussed by peds as possible treatment, she should avoid breastfeeding or pumping until 2 hours after taking medication to avoid peak plasma concentration. Additional information can be obtained by same book. Per chart mom had received Rifampin for 2 months, According to the  [facility] "Medications & Mother's Milk" book, Rifampin is an L2 and most likely not clinically relevant to breast milk ingestion.  LC was called to set up DEBP for mom due to separation of baby.  Mom is on contact isolation, PPE used during visit. Mom shown how to use DEBP & how to disassemble, clean, & reassemble parts.  LC assisted with pumping session and collected about 4ml to give to baby and brought to nursery.  Mom needs encouragement and reinforcement of information, but was able was able to assess understanding of DEBP teaching with teach back technique.  Mom is not comfortable with hand expression, but denies pain with DEBP. LC gave mom colostrum containers and syringe to collect EBM.   Mom has all supplies in room and should be able to pump independently after [facility].  Mom is to pump every 3 hours on preemies setting on the 11-15-10-3 schedule written on her board in room.  FOB at bedside to assist with cleaning supplies.  MBU RN to provide labels for EBM.     Patient Name: Cheryl Fitzgerald Hehir ZOXWR'UToday's Date: 04/07/2015 Reason for consult: Follow-up assessment;Other (Comment) (baby in nursery due to possible TB exposure of parents)   Maternal Data Has patient been taught  Hand Expression?: Yes  Feeding Feeding Type: Bottle Fed - Formula Nipple Type: Slow - flow  LATCH Score/Interventions                      Lactation Tools Discussed/Used WIC Program: Yes Pump Review: Setup, frequency, and cleaning;Milk Storage Initiated by:: JS, MBU RN to print labels for EBM Date initiated:: 04/07/15   Consult Status Consult Status: Follow-up Date: 04/07/15 Follow-up type: In-patient    Suren Payne, Arvella MerlesJana Lynn 04/07/2015, 8:10 PM

## 2015-04-07 NOTE — Lactation Note (Signed)
This note was copied from the chart of Cheryl Fitzgerald. Lactation Consultation Note  Patient Name: Cheryl Fitzgerald ZOXWR'UToday's Date: 04/07/2015 Reason for consult: Initial assessment Baby is 5 hours old seen for initial LC assessment. Mom on precautions for lice and TB. Pediatrician stated that it is ok for mom to BF. Mom & nurse asked for LC to come assist mom with BF. Baby was skin-to-skin [facility] & mom wanted to try to latch baby. Helped mom latch baby on right breast in cross-cradle hold. Mom needed a lot of assistants and seemed very unsure of what to do. Mom reports she did not take any BF classes so had a lot of questions. Mom has flatter nipples but did become everted slightly after baby suckled for a little. Also appears that baby's frenulum is on the tight side posteriorly as well has her lip frenulum appears tighter - did not mention this to mom until further assessment can be done on baby (about her nipples being flatter or about the baby's tongue & lip being tight). Mom has a lot of colostrum. Baby was able to latch on but her mouth was not very wide. Baby suckled for ~5 mins and LC heard a few swallows. After baby came off, baby would not open her mouth wide so placed baby skin-to-skin with mom. Provided mom with hand pump and showed mom how to use it as well as hand expression. Showed mom how to clean hand pump. Suggested that mom may want to use hand pump prior to BF to help stimulate her nipple or if she becomes full. Provided mom with Breastfeeding booklet, BF resources, and feeding log; discussed lactation number. Also reviewed breastfeeding pages in Baby & Me booklet - BF positions, baby behaviors, latch tips, feeding cues, feeding on demand, prevention of nipple soreness and engorgement, number of wets/ dirty diapers. Mom needs a lot of reassuring and reminding. Mom has WIC.    Maternal Data Has patient been taught Hand Expression?: Yes Does the patient have breastfeeding  experience prior to this delivery?: No  Feeding Feeding Type: Breast Fed  LATCH Score/Interventions Latch: Repeated attempts needed to sustain latch, nipple held in mouth throughout feeding, stimulation needed to elicit sucking reflex. Intervention(s): Adjust position;Assist with latch;Breast compression  Audible Swallowing: A few with stimulation Intervention(s): Skin to skin;Hand expression  Type of Nipple: Flat (nipples slightly flat but do evert slightly with latch) Intervention(s): Hand pump  Comfort (Breast/Nipple): Soft / non-tender     Hold (Positioning): Assistance needed to correctly position infant at breast and maintain latch. Intervention(s): Breastfeeding basics reviewed;Support Pillows;Position options;Skin to skin  LATCH Score: 6  Lactation Tools Discussed/Used WIC Program: Yes Pump Review: Setup, frequency, and cleaning   Consult Status Consult Status: Follow-up Date: 04/08/15 Follow-up type: In-patient    Oneal GroutLaura C Syre Knerr 04/07/2015, 2:43 PM

## 2015-04-07 NOTE — Progress Notes (Signed)
Rate of Pitocin reduced from 10 mU/min to 8 mU/min for tachysystole.

## 2015-04-07 NOTE — Progress Notes (Signed)
Primary RN attending delivery of other pt. Brief report received and care assumed by Lizbeth BarkV. Ivania Teagarden, RNC. This RN to bedside, pt in semi-fowlers position. EFM and TOCO adjusted. Pt denies needs at this time. Doula and family present.

## 2015-04-07 NOTE — Consult Note (Signed)
Neonatology Note:   Attendance at Delivery:    I was asked by Shaune LeeksM. Lawson, CNM for Dr. Despina HiddenEure to attend this NSVD at 41 2/7 weeks due to fetal tachycardia. The mother is a G2P0A1 A pos, GBS pos with positive PPD (negative CXR) and slight temperature elevation during labor. She got Pen G several doses beginning > 4 hours before delivery. ROM 2 hours prior to delivery, fluid clear. Infant vigorous with good spontaneous cry and tone. Needed only minimal bulb suctioning. HR about 160-170, no distress. Ap 8/9. Lungs clear to ausc in DR. To CN to care of Pediatrician.   Doretha Souhristie C. Crystal Scarberry, MD

## 2015-04-07 NOTE — Progress Notes (Deleted)
Pt requesting pain med. Primary RN attending needs of second pt. This RN reviewed order and reports to bedside with pain med. Pt declines need for pain med. Pain scale reviewed. Pt states she does not have pain currently and does not need medication. This RN informs O. Whitfield, RN and medication wasted in dispensing machine. 

## 2015-04-07 NOTE — Progress Notes (Signed)
Patient ID: Cheryl RubensteinKarin L Fitzgerald, female   DOB: July 20, 1995, 19 y.o.   MRN: [id number] Cheryl RubensteinKarin L Fitzgerald is a 19 y.o. G2P0010 at 4565w2d admitted for induction of labor due to Post dates. Due date 10/21 Called by nurse to evaluate presenting part- felt like something coming down w/ head.  Subjective: Pt uncomfortable w/ uc's, has epidural  Objective: BP 103/68 mmHg  Pulse 105  Temp(Src) 97.6 F (36.4 C) (Axillary)  Resp 18  Ht 5\' 6"  (1.676 m)  Wt 88.905 kg (196 lb)  BMI 31.65 kg/m2  SpO2 100%  LMP 06/29/2014 (Exact Date) Total I/O In: -  Out: 3000 [Urine:3000]  FHT:  FHR: 160 bpm, variability: moderate,  accelerations:  Present,  decelerations:  Absent UC:   regular, every 1-2 minutes  SVE:  6.5/80/-2, vtx, bbow, no other small parts felt, does have urinary catheter balloon that is palpable on anterior vaginal wall Pitocin @ 10 mu/min Recommended arom- pt refused at this time- states she wants her water to break on it's own, wants to give it a few more hours  Labs: Lab Results  Component Value Date   WBC 9.4 04/05/2015   HGB 13.4 04/05/2015   HCT 39.8 04/05/2015   MCV 86.1 04/05/2015   PLT 205 04/05/2015    Assessment / Plan: IOL d/t postdates, slightly tachysystolic- will decrease pit to 8, if cx unchanged at next exam will arom  Labor: Progressing normally Fetal Wellbeing:  Category I Pain Control:  Epidural, just got epidural pca dose Pre-eclampsia: n/a I/D:  pcn for gbs+ Anticipated MOD:  NSVD  Marge DuncansBooker, Khaza Blansett Randall CNM, WHNP-BC 04/07/2015, 3:36 AM

## 2015-04-08 LAB — CBC
HEMATOCRIT: 32.3 % — AB (ref 36.0–46.0)
HEMOGLOBIN: 10.7 g/dL — AB (ref 12.0–15.0)
MCH: 29.2 pg (ref 26.0–34.0)
MCHC: 33.1 g/dL (ref 30.0–36.0)
MCV: 88.3 fL (ref 78.0–100.0)
Platelets: 190 10*3/uL (ref 150–400)
RBC: 3.66 MIL/uL — AB (ref 3.87–5.11)
RDW: 14.1 % (ref 11.5–15.5)
WBC: 15.1 10*3/uL — ABNORMAL HIGH (ref 4.0–10.5)

## 2015-04-08 MED ORDER — PERMETHRIN 1 % EX LOTN
TOPICAL_LOTION | Freq: Once | CUTANEOUS | Status: DC
  Filled 2015-04-08: qty 59

## 2015-04-08 MED ORDER — RIFAMPIN 300 MG PO CAPS
600.0000 mg | ORAL_CAPSULE | Freq: Every day | ORAL | Status: DC
  Administered 2015-04-08 – 2015-04-09 (×2): 600 mg via ORAL
  Filled 2015-04-08 (×3): qty 2

## 2015-04-08 NOTE — Progress Notes (Signed)
Patient Name: Cheryl Fitzgerald, female   DOB: 12-20-1995, 19 y.o.  MRN: [id number]  I called Marcelino DusterMichelle, RN at [facility], who is the TB nurse, and discussed + PPD and treatment.  Patient had stopped treatment on own accord in August (after 2 months) due to abdominal discomfort and wanted to delay treatment until after baby was born.  She came in 2 weeks ago complaining of cough, chills, sputum production and had CXR and AFB culture, both of which were negative.  Patient to restart rifampin 600mg  daily will provide script.  Levie HeritageJacob J Kamori Barbier, DO 04/08/2015 9:18 AM
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
## 2015-04-08 NOTE — Progress Notes (Signed)
Talked with Junious Dresseronnie with infection control re pts isolation.  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  Patient given medication for lice.

## 2015-04-08 NOTE — Lactation Note (Addendum)
This note was copied from the chart of Girl Mardelle MatteKarin Wavra. Lactation Consultation Note  Patient Name: Girl Mardelle MatteKarin Dillingham ZOXWR'UToday's Date: 04/08/2015 Reason for consult: Follow-up assessment   With this mom and term baby, now 3129 hours old. The baby had been separated from mom until test results for TB came back. I assisted mom with latching the baby in cross cradle hold. The breast tissue being soft, it needs to be compressed and held at baby's mouth for her to latch. She latched well with strong, rhythmic suckles and visible swallows. Mom has lots of easily expressed colostrum. She fed for 12 minutes, and unlatched. I placed her skin to skin with mom, in an upright position. While standing at the bedside, I noted the baby's color change. I picked her up, she was very blue, not breathing for a  second, , but then began crying, and color pinked to normal. She had some upper airway congestion, and I had turned her prone in my hands, and gently suctioned her mouth and then nose - no secretions suctioned. I then called the front desk and asked that Shanda BumpsJessica, baby and mom's nurse come to check the baby. It was decided to bring her to the CNS to be monitored and observed for a couple of hours. Dr. Margo AyeHall made aware, and CNS charge, Lafonda MossesDonna Wear, also aware. The baby spit up with burping once in the CNS, yellowish colostrum, but color remained pink.  Mom was very sweet, but very anxious and unsure of herself, as far her baby and her care was concerned. She kept asking me _"are you sure" about many things I told her, like the baby was latched well, and was getting enough to eat. She called to Shanda BumpsJessica, her nurse, as she left the room, to remind her to come back "and show me how to change her diaper, because I do not know how."  I found her child like.  Mom knows to call for questions/concerns.    Maternal Data    Feeding Feeding Type: Breast Fed Length of feed: 12 min  LATCH Score/Interventions Latch: Grasps breast  easily, tongue down, lips flanged, rhythmical sucking. (mom has loose, very compressible breast tissue, that baby easily pulls into her mouth , when compressed for her. ) Intervention(s): Skin to skin;Teach feeding cues;Waking techniques Intervention(s): Adjust position;Assist with latch;Breast compression  Audible Swallowing: Spontaneous and intermittent Intervention(s): Skin to skin;Hand expression Intervention(s): Hand expression  Type of Nipple: Flat (very compressible) Intervention(s): No intervention needed  Comfort (Breast/Nipple): Soft / non-tender     Hold (Positioning): Assistance needed to correctly position infant at breast and maintain latch. Intervention(s): Breastfeeding basics reviewed;Support Pillows;Position options;Skin to skin  LATCH Score: 8  Lactation Tools Discussed/Used     Consult Status Consult Status: Follow-up Date: 04/09/15 Follow-up type: In-patient    Alfred LevinsLee, Mazi Schuff Anne 04/08/2015, 2:43 PM

## 2015-04-08 NOTE — Progress Notes (Signed)
Post Partum Day 1 Subjective: up ad lib, voiding and tolerating PO  Objective: Blood pressure 98/52, pulse 93, temperature 98 F (36.7 C), temperature source Oral, resp. rate 18, height 5\' 6"  (1.676 m), weight 196 lb (88.905 kg), last menstrual period 06/29/2014, SpO2 100 %, unknown if currently breastfeeding.  Physical Exam:  General: alert, cooperative, appears stated age and no distress Lochia: appropriate Uterine Fundus: firm Incision: n/a DVT Evaluation: Negative Homan's sign. No cords or calf tenderness.   Recent Labs  04/05/15 2116 04/08/15 0550  HGB 13.4 10.7*  HCT 39.8 32.3*    Assessment/Plan: Plan for discharge tomorrow   LOS: 2 days   LAWSON, MARIE DARLENE 04/08/2015, 7:40 AM

## 2015-04-09 ENCOUNTER — Encounter (HOSPITAL_COMMUNITY): Payer: Self-pay | Admitting: *Deleted

## 2015-04-09 ENCOUNTER — Inpatient Hospital Stay (HOSPITAL_COMMUNITY)
Admission: AD | Admit: 2015-04-09 | Discharge: 2015-04-09 | Disposition: A | Discharge status: Home / Self Care | Payer: Self-pay | Source: Ambulatory Visit | Attending: Obstetrics & Gynecology | Admitting: Obstetrics & Gynecology

## 2015-04-09 DIAGNOSIS — N6459 Other signs and symptoms in breast: Secondary | ICD-10-CM

## 2015-04-09 DIAGNOSIS — R509 Fever, unspecified: Secondary | ICD-10-CM | POA: Insufficient documentation

## 2015-04-09 DIAGNOSIS — Z87891 Personal history of nicotine dependence: Secondary | ICD-10-CM | POA: Insufficient documentation

## 2015-04-09 DIAGNOSIS — O9279 Other disorders of lactation: Secondary | ICD-10-CM | POA: Insufficient documentation

## 2015-04-09 LAB — CBC WITH DIFFERENTIAL/PLATELET
BASOS ABS: 0 10*3/uL (ref 0.0–0.1)
Basophils Relative: 0 %
Eosinophils Absolute: 0.2 10*3/uL (ref 0.0–0.7)
Eosinophils Relative: 2 %
HEMATOCRIT: 27.2 % — AB (ref 36.0–46.0)
Hemoglobin: 9 g/dL — ABNORMAL LOW (ref 12.0–15.0)
LYMPHS ABS: 2 10*3/uL (ref 0.7–4.0)
LYMPHS PCT: 20 %
MCH: 29.2 pg (ref 26.0–34.0)
MCHC: 33.1 g/dL (ref 30.0–36.0)
MCV: 88.3 fL (ref 78.0–100.0)
MONO ABS: 0.7 10*3/uL (ref 0.1–1.0)
MONOS PCT: 7 %
NEUTROS ABS: 7.2 10*3/uL (ref 1.7–7.7)
Neutrophils Relative %: 71 %
Platelets: 204 10*3/uL (ref 150–400)
RBC: 3.08 MIL/uL — ABNORMAL LOW (ref 3.87–5.11)
RDW: 14 % (ref 11.5–15.5)
WBC: 10.1 10*3/uL (ref 4.0–10.5)

## 2015-04-09 MED ORDER — OXYCODONE-ACETAMINOPHEN 5-325 MG PO TABS: 1.0000 | ORAL_TABLET | ORAL | Status: DC | PRN

## 2015-04-09 MED ORDER — OXYCODONE-ACETAMINOPHEN 5-325 MG PO TABS
2.0000 | ORAL_TABLET | Freq: Once | ORAL | Status: AC
  Administered 2015-04-09: 2 via ORAL
  Filled 2015-04-09: qty 2

## 2015-04-09 MED ORDER — RIFAMPIN 300 MG PO CAPS: 600.0000 mg | ORAL_CAPSULE | Freq: Every day | ORAL | Status: DC

## 2015-04-09 MED ORDER — PERMETHRIN 1 % EX LOTN: TOPICAL_LOTION | CUTANEOUS | Status: DC

## 2015-04-09 NOTE — Progress Notes (Addendum)
CSW and MSW intern followed up with [name] in order to continue to provide psychosocial support and brief therapy.  CSW and MSW intern spent approximately 45 minutes with [name] this morning.  [name] continues to be easily engaged and receptive to interventions, but presents as tired, overwhelmed, and anxious.  CSW and MSW intern utilized techniques from motivational interviewing, cognitive behavioral therapy, and solutions focused therapy in order to assist her to identify her current thoughts and feelings, events that have led to her thoughts and feelings, to explore anxious thought processes, and strategies to disengage from anxious thoughts processes.  [name] shared that she had anticipated knowing immediately how to care for the infant, and shared that she feels bad/guilty that she does not know how to immediately soothe or care for the infant.  With guidance, she is able to recognizes that her expectation for herself is not realistic, but she struggled to remind herself that learning how to parenthood takes time.  [name] remained focused on her perceptions of being a bad mother, and it was noted to be difficult for her to verbalize her strengths and the behaviors that she is exhibiting that demonstrate that she is a good mother.  [name] continues to process her current relationship with the FOB. She reflected upon the amount of stress she felt in the relationship due to his behaviors and limited support/invovlement, and was able to verbalize the positive and negative aspects of continuing and ending their relationship.  [name]'s comments highlight that she is focused on doing what is "best" for both her and the infant, including ending the relationship with the FOB since she believes she will feel "better" and "happier".   CSW and MSW intern attempted to assist the [name] to redefine expectations for the FOB since her comments demonstrated that her reality strongly contrasts her expectations and she becomes frustrated when he does  not meet her expectations, but she was minimally receptive to this intervention.  CSW continued to assist the [name] focus on what is within her control in regards to the relationship, and [name] acknowledged that she can only control herself and her own behaviors.  During the assessment, [name] was frequently reporting concern that the infant was hungry. She stated that she felt that the infant was displaying hunger cues whenever the infant opened her mouth.  CSW reviewed hunger cues, and attempted to assist the [name] to disengage from anxious thoughts whenever the infant would move in the crib or yawn.  [name] remains hyper-focused on her belief that something is "wrong" with the infant, even when there are no indicators.  CSW and MSW intern will continue to work with the [name] during the admission in order to assist her with cognitive techniques to reduce anxiety and increase sense of self-efficacy.  Additional referral made to Parents as Teachers.  

## 2015-04-09 NOTE — Discharge Instructions (Signed)
Breast Engorgement °Breast engorgement is the overfilling of your breasts with breast milk. In the first few weeks after giving birth, you may experience breast engorgement. Although it is normal for your breasts to feel heavy, full, and uncomfortable within 3-5 days of giving birth, breast engorgement can make your breasts throb and feel hard, tightly stretched, warm, and tender. Engorgement peaks about the fifth day after you give birth. Breast engorgement can be easily treated and does not require you to stop breastfeeding.  °CAUSES OF BREAST ENGORGEMENT °Some women delay feedings because of sore or cracked nipples, which can lead to engorgement. Cracked and sore nipples often are caused by inadequate latching (when your baby's mouth attaches to your breast to breastfeed). If your baby is latched on properly, he or she should be able to breastfeed as long as needed, without causing any pain. If you do feel pain while breastfeeding, take your baby off your breast and try again. Get help from your health care provider or a lactation consultant if you continue to have pain. °Other causes of engorgement include:  °· Improper position of your baby while breastfeeding. °· Allowing too much time to pass between feedings. °· Reduction in breastfeeding because you give your baby water, juice, formula, breast milk from a bottle, or a pacifier instead of breastfeeding. °· Changes in your baby's feeding patterns. °· Weak sucking from your baby, which causes less milk to be taken out of your breast during feedings. °· Fatigue, stress, anemia. °· Plugged milk ducts. °· A history of breast surgery. °SIGNS AND SYMPTOMS OF BREAST ENGORGEMENT °If your breasts become engorged, you may experience:  °· Breast swelling, tenderness, warmth, redness, or throbbing. °· Breast hardness and stretching of the skin around your breast. °· Flattening, tightening, and hardening of your nipple. °· A low-grade fever, which can be confused with a  breast infection. °RECOMMENDATIONS TO EASE BREAST ENGORGEMENT °Breast engorgement should improve in 24-48 hours after following these recommendations: °· Breastfeed when you feel the need to reduce the fullness of your breasts or when your baby shows signs of hunger. This is called "breastfeeding on demand." °· Newborns (babies younger than 4 weeks) often breastfeed every 1-3 hours during the day. You may need to awaken your baby to feed if he or she is asleep at a feeding time. °· Do not allow your baby to sleep longer than 5 hours during the night without a feeding. °· Pump or hand-express breast milk before breastfeeding to soften your breast, areola, and nipple.   °· Apply warm, moist heat (in the shower or with warm water-soaked hand towels) just before feeding or pumping, or massage your breast before or during breastfeeding. This increases circulation and helps your milk to flow. °· Completely empty your breasts when breastfeeding or pumping. Afterward, wear a snug bra (nursing or regular) or tank top for 1-2 days to signal your body to slightly decrease milk production. Only wear snug bras or tank tops to treat engorgement. Tight bras typically should be avoided by breastfeeding mothers. Once engorgement is relieved, return to wearing regular, loose-fitting clothes. °· Apply ice packs to your breasts to lessen the pain from engorgement and relieve swelling, unless the ice is uncomfortable for you. °· Do not delay feedings. Try to relax when it is time to feed your baby. This helps to trigger your "let-down reflex," which releases milk from your breast. °· Ensure your baby is latched on to your breast and positioned properly while breastfeeding.   °· Allow   your baby to remain at your breast as long as he or she is latched on well and actively sucking. Your baby will let you know when he or she is done breastfeeding by pulling away from your breast or falling asleep. °· Avoid introducing bottles or pacifiers  to your baby in the early weeks of breastfeeding. Wait to introduce these things until after resolving any breastfeeding challenges. °· Try to pump your milk on the same schedule as when your baby would breastfeed if you are returning to work or away from home for an extended period. °· Drink plenty of fluids to avoid dehydration, which can eventually put you at greater risk of breast engorgement.   °CALL YOUR HEALTH CARE PROVIDER OR LACTATION CONSULTANT IF:  °· Engorgement lasts longer than 2 days, even after treatment. °· You have flu-like symptoms, such as a fever, chills, or body aches. °· Your breasts become increasingly red and painful. °  °This information is not intended to replace advice given to you by your health care provider. Make sure you discuss any questions you have with your health care provider. °  °Document Released: 09/19/2004 Document Revised: 06/15/2014 Document Reviewed: 11/17/2012 °Elsevier Interactive Patient Education ©2016 [employer] ° °

## 2015-04-09 NOTE — Discharge Instructions (Signed)

## 2015-04-09 NOTE — MAU Note (Signed)
PT WAS D/C  TODAY  BUT  HER BABY HAS FEEDING  PROBLEMS   SO IS  STILL A  PT.    IN RM  140.-   PT SAYS  LAST NIGHT  AND  TODAY-   ENGORGENMENT  AND THIGHS HAVE CRAMPS,    HAS FEVER- CHILLS,  IS  NOT  SLEEPING ,   H/A.      NURSE GAVE  HER AN ICE PK  AND  BOX  OF  IBUPROFEN.

## 2015-04-09 NOTE — MAU Provider Note (Signed)
History     CSN: [id number]  Arrival date and time: 04/09/15 2230   First Provider Initiated Contact with Patient 04/09/15 2301      No chief complaint on file.  HPI Ms. Cheryl Fitzgerald is a 19 y.o. G2P1011 who is PPD #2 presents to MAU today with complaint of breast pain and fever. She had SVD on 04/07/15 and was discharged this morning. She is rooming in with the baby who was not discharged. She states bilateral breast pain tonight and feeling warm to touch. She is unsure about fever, but states that she has been taking Motrin regularly today and last dose was just prior to coming to MAU. She states that pain is diffuse and bilateral. She denies any areas of redness or increased pain. She states that baby has not been latching well. She is pumping, but is unsure how often. She has also used some ice to the area tonight which seems to be helping. She also continues to have lower abdominal pain intermittently. She is still having bleeding, but it is not too heavy. She is concerned because she has not slept well since the baby was born.   OB History    Gravida Para Term Preterm AB TAB SAB Ectopic Multiple Living   0 1      Past Medical History  Diagnosis Date  . Depression     no medication  . Adjustment disorder with mixed emotional features 03/23/2015    Past Surgical History  Procedure Laterality Date  . No past surgeries      History reviewed. No pertinent family history.  Social History  Substance Use Topics  . Smoking status: Former Games developer  . Smokeless tobacco: Never Used  . Alcohol Use: No    Allergies: No Known Allergies  No prescriptions prior to admission    Review of Systems  Constitutional: Positive for fever. Negative for malaise/fatigue.  Genitourinary:       + vaginal bleeding   Physical Exam   Blood pressure 115/70, pulse 101, temperature 98.1 F (36.7 C), temperature source Oral, resp. rate 18, last menstrual period 06/29/2014, unknown  if currently breastfeeding.  Physical Exam  Nursing note and vitals reviewed. Constitutional: She is oriented to person, place, and time. She appears well-developed and well-nourished. No distress.  HENT:  Head: Normocephalic and atraumatic.  Cardiovascular: Normal rate.   Respiratory: Effort normal. Right breast exhibits tenderness. Right breast exhibits no inverted nipple, no mass, no nipple discharge and no skin change. Left breast exhibits tenderness. Left breast exhibits no inverted nipple, no mass, no nipple discharge and no skin change.  GI: Soft. She exhibits no distension and no mass. There is tenderness (mild tenderness to palpation at the fundus). There is no rebound and no guarding.  Fundus is soft  Neurological: She is alert and oriented to person, place, and time.  Skin: Skin is warm and dry. No erythema.  Psychiatric: She has a normal mood and affect.     Results for orders placed or performed during the hospital encounter of 04/09/15 (from the past 24 hour(s))  CBC with Differential/Platelet     Status: Abnormal   Collection Time: 04/09/15 11:31 PM  Result Value Ref Range   WBC 10.1 4.0 - 10.5 K/uL   RBC 3.08 (L) 3.87 - 5.11 MIL/uL   Hemoglobin 9.0 (L) 12.0 - 15.0 g/dL   HCT 40.9 (L) 81.1 - 91.4 %   MCV 88.3 78.0 -  100.0 fL   MCH 29.2 26.0 - 34.0 pg   MCHC 33.1 30.0 - 36.0 g/dL   RDW 78.414.0 69.611.5 - 29.515.5 %   Platelets 204 150 - 400 K/uL   Neutrophils Relative % 71 %   Neutro Abs 7.2 1.7 - 7.7 K/uL   Lymphocytes Relative 20 %   Lymphs Abs 2.0 0.7 - 4.0 K/uL   Monocytes Relative 7 %   Monocytes Absolute 0.7 0.1 - 1.0 K/uL   Eosinophils Relative 2 %   Eosinophils Absolute 0.2 0.0 - 0.7 K/uL   Basophils Relative 0 %   Basophils Absolute 0.0 0.0 - 0.1 K/uL     MAU Course  Procedures None  MDM CBC today 2 Percocet given for pain  Assessment and Plan  A: Postpartum Breast engorgement  P: Discharge home Patient advised to continue Motrin and Percocet for  pain as previously prescribed Advised continued ice to the area Warning signs for worsening condition discussed Patient advised to follow-up with [facility] as planned for routine PP visit in 4-6 weeks Patient may return to MAU as needed or if her condition were to change or worsen   Marny LowensteinJulie N Wenzel, PA-C  04/10/2015, 12:11 AM

## 2015-04-09 NOTE — Progress Notes (Signed)
CSW and MSW intern continue to closely follow in order to provide brief therapy.    [name]'s mother-in-law and FOB present during the assessment.  [name] presented as less anxious and overwhelmed in comparison to previous interactions.  She stated that she is beginning to feel increase sense of self-efficacy since she is realizing that she can and is able to breastfeed. CSW assisted the [name] to identify her behaviors and efforts that have assisted her to feel confident in her own parenting abilities.  CSW continued to assist the [name] to create positive mantras/self-talk that she can utilize when she begins to feel anxious and overwhelmed.  [name] smiled as she reflected upon her strengths and her ability to care for the infant.  MSW intern continued to explore with [name] self-care activities that she has previously utilized to reduce anxiety, and explored with [name] how to incorporate these activities into her routine postpartum.  [name] recognized the importance of engaging in self-care since she knows that she cannot care for the infant if she does not care for herself.  CSW encouraged [name] to begin to identify self-care activities that she can engage in today, and she originally reported that she was unsure if she was able to do anything.  [name] acknowledged that she can eat and sleep now that she has family present to assist her with caring for the infant.  [name] continues to require reassurance that it is normal for the infant to cry and it is normal to not always know what is wrong with the infant; however, [name] is able to acknowledge that she has strengths and abilities as a mother. She presents as more positive and optimistic about her parenting schools.   CSW will follow up with [name] on 11/2.

## 2015-04-09 NOTE — Lactation Note (Signed)
This note was copied from the chart of Cheryl Mardelle MatteKarin Dilone. Lactation Consultation Note  Patient Name: Cheryl Fitzgerald ZOXWR'UToday's Date: 04/09/2015 Reason for consult: Follow-up assessment;Infant weight loss;Other (Comment) (oral variance )  6% weight loss, many attempts at the breast , and 3 feedings 12-15 -10 mins . Also has been supplemented with  A bottle. Latch scores 6-5-8 x3 , Voids and stools adequate for age ( 4 wets, 11 stools , and 7 spits ) Bili check at 39 hours - 4   Baby hungry at consult , mom seemed to be very anxious with baby care. LC asked mom to participate in the baby's diaper change,  And latching. Baby latched for 15 mins and sustained a consistent pattern with multiply swallows , increased with breast compressions.  Baby ended the feeding. Mom has a steady flow of colostrum.LC assisted mom to obtain depth and adequate support ( 4 levels ), which worked well.  Used the football position which was a great help for mom. Baby released , acted hungry for a few minutes , LC had mom re- latch with tea cup technique  And baby did well for a few sucks and released.  Baby also noted to have a short labial frenulum above gum line , upper lip stretches well with exam and at the latch.  Short anterior frenulum noted and that may explain why the baby has had on and off latch and many attempts .  Mom has semi flat nipples ( erect with stimulation ) and compressible areolas, steady flow of colostrum.  Dr. Margo AyeHall aware.  Mom needed a lot reinforcement with basic breast feeding and baby teaching.  Mom will need reinforcement with breast feeding [facility].    Maternal Data Has patient been taught Hand Expression?: Yes (steady flow of colostrum ) Does the patient have breastfeeding experience prior to this delivery?: No  Feeding Feeding Type: Breast Fed Length of feed: 15 min  LATCH Score/Interventions Latch: Repeated attempts needed to sustain latch,  nipple held in mouth throughout feeding, stimulation needed to elicit sucking reflex. Intervention(s): Skin to skin;Teach feeding cues;Waking techniques Intervention(s): Adjust position;Assist with latch;Breast massage;Breast compression  Audible Swallowing: Spontaneous and intermittent Intervention(s): Hand expression  Type of Nipple: Everted at rest and after stimulation Intervention(s): Reverse pressure  Comfort (Breast/Nipple): Soft / non-tender     Hold (Positioning): Assistance needed to correctly position infant at breast and maintain latch. Intervention(s): Breastfeeding basics reviewed;Support Pillows;Position options;Skin to skin  LATCH Score: 8  Lactation Tools Discussed/Used     Consult Status Consult Status: Follow-up Date: 04/09/15 Follow-up type: In-patient    Kathrin Greathouseorio, Tavarius Grewe Ann 04/09/2015, 11:10 AM
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
## 2015-04-10 ENCOUNTER — Ambulatory Visit: Payer: Self-pay

## 2015-04-10 NOTE — Lactation Note (Signed)
This note was copied from the chart of Cheryl Mardelle MatteKarin Waller. Lactation Consultation Note: Mother states that she has been only pumping her breast. She states that infant is having a difficult time latching on . Infant is at 9% weight loss. When discussing mother's goal of breastfeed she first states that she wants to pump breast and bottle feed and in the next sentence she states she wants to breastfeed.  Lots of assistance with explaining good support and positioning. Mother is engorged. She was assist with pre pumping left breast. Discussed the use of a Nipple shield. Mother was fit with a #24 nipple shield. Infant sustained latch for 20 mins. Father taught paced bottle feeding, Infant took 30 ml ebm from the bottle.  Advised mother to post pump for 20 mins. Mother will page to ask for assist with next feeding.   Patient Name: Cheryl Fitzgerald ZOXWR'UToday's Date: 04/10/2015 Reason for consult: Follow-up assessment   Maternal Data    Feeding Feeding Type: Breast Fed Length of feed: 25 min  LATCH Score/Interventions Latch: Grasps breast easily, tongue down, lips flanged, rhythmical sucking. Intervention(s): Skin to skin Intervention(s): Adjust position;Breast massage;Breast compression  Audible Swallowing: Spontaneous and intermittent (observed good milk transfer with breast softening) Intervention(s): Skin to skin;Hand expression  Type of Nipple: Flat Intervention(s): Hand pump  Comfort (Breast/Nipple): Engorged, cracked, bleeding, large blisters, severe discomfort Problem noted: Engorgment     Hold (Positioning): Assistance needed to correctly position infant at breast and maintain latch. Intervention(s): Skin to skin  LATCH Score: 6  Lactation Tools Discussed/Used Tools: Nipple Shields Nipple shield size: 24   Consult Status Consult Status: Follow-up Date: 04/10/15 Follow-up type: In-patient    Stevan BornKendrick, [facility] 04/10/2015, 3:25 PM

## 2015-04-10 NOTE — Lactation Note (Signed)
This note was copied from the chart of Cheryl Mardelle MatteKarin Seelig. Lactation Consultation Note: Right breast very engorged. Mother has ice pack on breast. Assist mother with pre pumping right breast using a hand pump. Mother was fit with a #27 flange. She Pumped 15-20 ml . Mother has another 20 ml ebm at the bedside.  Reviewed proper application of the nipple shield with mother and gave her a handout. Mother placed nipple shield on the right breast several times. Infant latched with good depth. Assist mother with massaging firm breast. Observed good milk transfer. Mother was given a written plan to supplement infant after each feeding according to guidelines to resolve engorgement. Advised mother to give at least 30 ml after feeding as needed. Mother is active with [place] [facility] . [facility]. She states she has no money to purchase a bra or a pump. Advised mother to do good breast massage and continue to ice. Advised to allow infant to drain breast well with good breast compression. Reviewed importance of removing milk every 2-3 hours. Father at the bedside and is attentive to all teaching. Mother displays lots of anxiety. Father of infant states he is aware of her anxiety . Advised to assist mother as needed. [facility]   Patient Name: Cheryl Fitzgerald: 04/10/2015 Reason for consult: Follow-up assessment   Maternal Data    Feeding Feeding Type: Breast Fed Length of feed: 25 min  LATCH Score/Interventions Latch: Grasps breast easily, tongue down, lips flanged, rhythmical sucking. Intervention(s): Skin to skin Intervention(s): Adjust position;Breast massage;Breast compression  Audible Swallowing: Spontaneous and intermittent (observed good milk transfer with breast softening) Intervention(s):  Skin to skin;Hand expression  Type of Nipple: Flat Intervention(s): Hand pump  Comfort (Breast/Nipple): Engorged, cracked, bleeding, large blisters, severe discomfort Problem noted: Engorgment     Hold (Positioning): Assistance needed to correctly position infant at breast and maintain latch. Intervention(s): Skin to skin  LATCH Score: 6  Lactation Tools Discussed/Used Tools: Nipple Shields Nipple shield size: 24   Consult Status Consult Status: Follow-up Fitzgerald: 04/10/15 Follow-up type: In-patient    Stevan BornKendrick, Dorotea Hand [facility] 04/10/2015, 4:13 PM

## 2015-05-21 ENCOUNTER — Emergency Department
Admission: EM | Admit: 2015-05-21 | Discharge: 2015-05-21 | Disposition: A | Discharge status: Home / Self Care | Payer: Self-pay | Attending: Student | Admitting: Student

## 2015-05-21 ENCOUNTER — Encounter: Payer: Self-pay | Admitting: Medical Oncology

## 2015-05-21 DIAGNOSIS — R7989 Other specified abnormal findings of blood chemistry: Secondary | ICD-10-CM

## 2015-05-21 DIAGNOSIS — Z79899 Other long term (current) drug therapy: Secondary | ICD-10-CM | POA: Insufficient documentation

## 2015-05-21 DIAGNOSIS — Z87891 Personal history of nicotine dependence: Secondary | ICD-10-CM | POA: Insufficient documentation

## 2015-05-21 DIAGNOSIS — R51 Headache: Secondary | ICD-10-CM | POA: Insufficient documentation

## 2015-05-21 DIAGNOSIS — R945 Abnormal results of liver function studies: Secondary | ICD-10-CM | POA: Insufficient documentation

## 2015-05-21 LAB — URINALYSIS COMPLETE WITH MICROSCOPIC (ARMC ONLY)
BACTERIA UA: NONE SEEN
Bilirubin Urine: NEGATIVE
Glucose, UA: NEGATIVE mg/dL
Hgb urine dipstick: NEGATIVE
Ketones, ur: NEGATIVE mg/dL
Leukocytes, UA: NEGATIVE
Nitrite: NEGATIVE
PH: 6 (ref 5.0–8.0)
PROTEIN: NEGATIVE mg/dL
Specific Gravity, Urine: 1.004 — ABNORMAL LOW (ref 1.005–1.030)

## 2015-05-21 LAB — COMPREHENSIVE METABOLIC PANEL
ALBUMIN: 4 g/dL (ref 3.5–5.0)
ALT: 119 U/L — ABNORMAL HIGH (ref 14–54)
AST: 56 U/L — AB (ref 15–41)
Alkaline Phosphatase: 121 U/L (ref 38–126)
Anion gap: 4 — ABNORMAL LOW (ref 5–15)
BILIRUBIN TOTAL: 0.2 mg/dL — AB (ref 0.3–1.2)
BUN: 14 mg/dL (ref 6–20)
CHLORIDE: 105 mmol/L (ref 101–111)
CO2: 28 mmol/L (ref 22–32)
Calcium: 9.2 mg/dL (ref 8.9–10.3)
Creatinine, Ser: 0.64 mg/dL (ref 0.44–1.00)
GFR calc Af Amer: >60 mL/min (ref >60)
GFR calc non Af Amer: >60 mL/min (ref >60)
GLUCOSE: 72 mg/dL (ref 65–99)
POTASSIUM: 3.6 mmol/L (ref 3.5–5.1)
Sodium: 137 mmol/L (ref 135–145)
Total Protein: 7.3 g/dL (ref 6.5–8.1)

## 2015-05-21 LAB — AFB CULTURE WITH SMEAR (NOT AT ARMC)
Acid Fast Smear: NONE SEEN
Special Requests: NORMAL

## 2015-05-21 LAB — CBC
HEMATOCRIT: 39.6 % (ref 35.0–47.0)
Hemoglobin: 12.8 g/dL (ref 12.0–16.0)
MCH: 26.8 pg (ref 26.0–34.0)
MCHC: 32.3 g/dL (ref 32.0–36.0)
MCV: 82.9 fL (ref 80.0–100.0)
PLATELETS: 278 10*3/uL (ref 150–440)
RBC: 4.78 MIL/uL (ref 3.80–5.20)
RDW: 13.9 % (ref 11.5–14.5)
WBC: 7.4 10*3/uL (ref 3.6–11.0)

## 2015-05-21 LAB — ACETAMINOPHEN LEVEL: Acetaminophen (Tylenol), Serum: <10 ug/mL — ABNORMAL LOW (ref 10–30)

## 2015-05-21 NOTE — ED Provider Notes (Signed)
[facility] Emergency Department Provider Note  ____________________________________________  Time seen: Approximately 4:29 PM  I have reviewed the triage vital signs and the nursing notes.   HISTORY  Chief Complaint Abdominal Pain    HPI Cheryl Fitzgerald is a 19 y.o. female with history of depression who presents for evaluation of a liver function test abnormality, onset unknown, severity unknown. Patient reports that she had vaginal delivery of a term infant on 04/07/2015. Prior to that, she been diagnosed with latent tuberculosis in July of this year and had been on rifampin since August. She was seen at the health Department 6 days ago for post-partum follow-up. She had labs drawn at that time she received a phone call today telling them that her "liver was malfunctioning" and that she needed to come emergently to the emergency department. She has been off of rifampin for several weeks. She reports that she took a maximum of 2 x 500 mg Tylenol tabs each night last week for migraines and is worried "that I  havebeen overdosing on Tylenol". She has had no abdominal pain, no vomiting, diarrhea, fevers or chills. She has had mild intermittent headache no vision change, no weakness, no pain or burning with urination.   Past Medical History  Diagnosis Date  . Depression     no medication  . Adjustment disorder with mixed emotional features 03/23/2015    Patient Active Problem List   Diagnosis Date Noted  . Post-dates pregnancy 04/05/2015  . Adjustment disorder with mixed emotional features 03/23/2015  . Depression complicating pregnancy, antepartum 03/22/2015  . Supervision of high risk pregnancy due to social problems in third trimester 03/22/2015  . 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 Inadequate housing 03/22/2015  . Food hunger 03/22/2015  . Decreased fetal movement determined by examination 03/06/2015    Past Surgical History  Procedure Laterality  Date  . No past surgeries      Current Outpatient Rx  Name  Route  Sig  Dispense  Refill  . acetaminophen (TYLENOL) 500 MG tablet   Oral   Take 500 mg by mouth every 6 (six) hours as needed for moderate pain or headache.         . guaiFENesin (ROBITUSSIN) 100 MG/5ML SOLN   Oral   Take 5 mLs (100 mg total) by mouth every 4 (four) hours as needed for cough or to loosen phlegm.   1200 mL   0   . oxyCODONE-acetaminophen (PERCOCET/ROXICET) 5-325 MG tablet   Oral   Take 1 tablet by mouth every 4 (four) hours as needed for severe pain.   10 tablet   0   . permethrin (ELIMITE) 1 % lotion      Shampoo, rinse and towel dry hair, saturate hair and scalp with permethrin. Rinse after 10 min; repeat in 1 week if needed   59 mL   0   . Prenatal Vit-Fe Fumarate-FA (MULTIVITAMIN-PRENATAL) 27-0.8 MG TABS tablet   Oral   Take 1 tablet by mouth daily at 12 noon.         . rifampin (RIFADIN) 300 MG capsule   Oral   Take 2 capsules (600 mg total) by mouth daily.   60 capsule   1     Allergies Review of patient's allergies indicates no known allergies.  No family history on file.  Social History Social History  Substance Use Topics  . Smoking status: Former Games developer  . Smokeless tobacco: Never Used  . Alcohol Use: No  Review of Systems Constitutional: No fever/chills Eyes: No visual changes. ENT: No sore throat. Cardiovascular: Denies chest pain. Respiratory: Denies shortness of breath. Gastrointestinal: No abdominal pain.  No nausea, no vomiting.  No diarrhea.  No constipation. Genitourinary: Negative for dysuria. Musculoskeletal: Negative for back pain. Skin: Negative for rash. Neurological: Positive for mild intermittent headaches, no focal weakness or numbness.  10-point ROS otherwise negative.  ____________________________________________   PHYSICAL EXAM:  VITAL SIGNS: ED Triage Vitals  Enc Vitals Group     BP 05/21/15 1539 119/68 mmHg     Pulse Rate  05/21/15 1539 78     Resp 05/21/15 1539 18     Temp 05/21/15 1539 98.2 F (36.8 C)     Temp Source 05/21/15 1539 Oral     SpO2 05/21/15 1539 97 %     Weight 05/21/15 1539 185 lb (83.915 kg)     Height 05/21/15 1539  (1.676 m)     Head Cir --      Peak Flow --      Pain Score 05/21/15 1539 8     Pain Loc --      Pain Edu? --      Excl. in GC? --     Constitutional: Alert and oriented. Well appearing and in no acute distress. Eyes: Conjunctivae are normal. PERRL. EOMI. Head: Atraumatic. Nose: No congestion/rhinnorhea. Mouth/Throat: Mucous membranes are moist.  Oropharynx non-erythematous. Neck: No stridor.  Cardiovascular: Normal rate, regular rhythm. Grossly normal heart sounds.  Good peripheral circulation. Respiratory: Normal respiratory effort.  No retractions. Lungs CTAB. Gastrointestinal: Soft and nontender. No distention. No abdominal bruits. No CVA tenderness. Genitourinary: deferred Musculoskeletal: No lower extremity tenderness nor edema.  No joint effusions. Neurologic:  Normal speech and language. No gross focal neurologic deficits are appreciated. No gait instability. Skin:  Skin is warm, dry and intact. No rash noted. Psychiatric: Mood and affect are normal. Speech and behavior are normal.  ____________________________________________   LABS (all labs ordered are listed, but only abnormal results are displayed)  Labs Reviewed  COMPREHENSIVE METABOLIC PANEL - Abnormal; Notable for the following:    AST 56 (*)    ALT 119 (*)    Total Bilirubin 0.2 (*)    Anion gap 4 (*)    All other components within normal limits  URINALYSIS COMPLETEWITH MICROSCOPIC ([facility] ONLY) - Abnormal; Notable for the following:    Color, Urine STRAW (*)    APPearance CLEAR (*)    Specific Gravity, Urine 1.004 (*)    Squamous Epithelial / LPF 0-5 (*)    All other components within normal limits  ACETAMINOPHEN LEVEL - Abnormal; Notable for the following:    Acetaminophen  (Tylenol), Serum <10 (*)    All other components within normal limits  CBC  POC URINE PREG, ED   ____________________________________________  EKG  none ____________________________________________  RADIOLOGY  none ____________________________________________   PROCEDURES  Procedure(s) performed: None  Critical Care performed: No  ____________________________________________   INITIAL IMPRESSION / ASSESSMENT AND PLAN / ED COURSE  Pertinent labs & imaging results that were available during my care of the patient were reviewed by me and considered in my medical decision making (see chart for details).  Cheryl Fitzgerald is a 19 y.o. female with history of depression who presents for evaluation of a liver function test abnormality, onset unknown, severity unknown. On exam, she is very well-appearing and in no acute distress. Vital signs are stable, she is afebrile. She has a benign examination.  Labs reviewed. Normal CBC. Urinalysis by consist with infection. Acetaminophen level is less than 10. CMP is notable for mild AST and ALT elevation. AST is 56, ALT is 119. Blood pressure is normal. No evidence to suggest HELLP syndrome. Discussed that she will need to follow up with her OB/GYN doctor next week for recheck of her mildly elevated liver function tests. She reports that the health department told her to continue to suspend her rifampin. We discussed return precautions, need for close follow-up and she comfortable with the discharge plan. Not consistent with acute or chronic Tylenol overdose. DC home. ____________________________________________   FINAL CLINICAL IMPRESSION(S) / ED DIAGNOSES  Final diagnoses:  LFT elevation      Gayla DossEryka A Lena Fieldhouse, MD 05/21/15 [phone]

## 2015-05-21 NOTE — ED Notes (Signed)
Pt states she overdosed on tylenol 500mg  from migraines and dizziness last week. She states she is feeling sad adn weak since giving birth oct 30th.

## 2015-05-21 NOTE — ED Notes (Signed)
Pt reports she had a baby on 10/30 and since has been following up for post partum depression, pt reports that she had an appt last week when they did lab work and liver enzymes came back elevated. Pt was taking 500mg  tylenol every night and was also been taking rifampin for latent TB. Pt was told by pcp to quit taking these meds and to come to er for re-check. Pt reports just body aches.

## 2015-05-21 NOTE — ED Notes (Signed)
MD gayle at bedside.

## 2015-05-21 NOTE — ED Notes (Signed)
Lab called foradd on  preg on urine

## 2015-05-21 NOTE — Discharge Instructions (Signed)
Follow-up with your doctor next week for recheck of your liver function tests. Return to the emergency permit if you develop severe abdominal pain, change in your skin color, vomiting, fevers, severe or worsening headache,chest pain, trouble breathing, numbness, weakness, vision change, or for any other concern.

## 2015-05-21 NOTE — ED Notes (Signed)
Lab called for add on of acetaminophen level

## 2015-06-09 ENCOUNTER — Emergency Department
Admission: EM | Admit: 2015-06-09 | Discharge: 2015-06-09 | Disposition: A | Discharge status: Home / Self Care | Payer: Self-pay | Attending: Emergency Medicine | Admitting: Emergency Medicine

## 2015-06-09 ENCOUNTER — Encounter: Payer: Self-pay | Admitting: Emergency Medicine

## 2015-06-09 DIAGNOSIS — Z87891 Personal history of nicotine dependence: Secondary | ICD-10-CM | POA: Insufficient documentation

## 2015-06-09 DIAGNOSIS — Z79899 Other long term (current) drug therapy: Secondary | ICD-10-CM | POA: Insufficient documentation

## 2015-06-09 DIAGNOSIS — N938 Other specified abnormal uterine and vaginal bleeding: Secondary | ICD-10-CM | POA: Insufficient documentation

## 2015-06-09 DIAGNOSIS — Z3202 Encounter for pregnancy test, result negative: Secondary | ICD-10-CM | POA: Insufficient documentation

## 2015-06-09 DIAGNOSIS — F419 Anxiety disorder, unspecified: Secondary | ICD-10-CM | POA: Insufficient documentation

## 2015-06-09 LAB — CBC WITH DIFFERENTIAL/PLATELET
BASOS PCT: 0 %
Basophils Absolute: 0 10*3/uL (ref 0–0.1)
EOS ABS: 0.1 10*3/uL (ref 0–0.7)
Eosinophils Relative: 1 %
HCT: 42.7 % (ref 35.0–47.0)
HEMOGLOBIN: 13.9 g/dL (ref 12.0–16.0)
Lymphocytes Relative: 24 %
Lymphs Abs: 1.6 10*3/uL (ref 1.0–3.6)
MCH: 26.4 pg (ref 26.0–34.0)
MCHC: 32.6 g/dL (ref 32.0–36.0)
MCV: 80.9 fL (ref 80.0–100.0)
Monocytes Absolute: 0.6 10*3/uL (ref 0.2–0.9)
Monocytes Relative: 8 %
NEUTROS PCT: 67 %
Neutro Abs: 4.5 10*3/uL (ref 1.4–6.5)
Platelets: 242 10*3/uL (ref 150–440)
RBC: 5.28 MIL/uL — AB (ref 3.80–5.20)
RDW: 14.1 % (ref 11.5–14.5)
WBC: 6.8 10*3/uL (ref 3.6–11.0)

## 2015-06-09 LAB — COMPREHENSIVE METABOLIC PANEL
ALBUMIN: 4.5 g/dL (ref 3.5–5.0)
ALK PHOS: 100 U/L (ref 38–126)
ALT: 117 U/L — AB (ref 14–54)
AST: 84 U/L — AB (ref 15–41)
Anion gap: 5 (ref 5–15)
BUN: 13 mg/dL (ref 6–20)
CALCIUM: 9 mg/dL (ref 8.9–10.3)
CO2: 25 mmol/L (ref 22–32)
CREATININE: 0.55 mg/dL (ref 0.44–1.00)
Chloride: 103 mmol/L (ref 101–111)
GFR calc Af Amer: >60 mL/min (ref >60)
GFR calc non Af Amer: >60 mL/min (ref >60)
GLUCOSE: 91 mg/dL (ref 65–99)
Potassium: 3.5 mmol/L (ref 3.5–5.1)
SODIUM: 133 mmol/L — AB (ref 135–145)
Total Bilirubin: 0.3 mg/dL (ref 0.3–1.2)
Total Protein: 7.8 g/dL (ref 6.5–8.1)

## 2015-06-09 LAB — PREGNANCY, URINE: Preg Test, Ur: NEGATIVE

## 2015-06-09 LAB — URINALYSIS COMPLETE WITH MICROSCOPIC (ARMC ONLY)
Bacteria, UA: NONE SEEN
Bilirubin Urine: NEGATIVE
GLUCOSE, UA: 50 mg/dL — AB
KETONES UR: NEGATIVE mg/dL
Nitrite: NEGATIVE
Protein, ur: 100 mg/dL — AB
SPECIFIC GRAVITY, URINE: 1.018 (ref 1.005–1.030)
Squamous Epithelial / LPF: NONE SEEN
pH: 6 (ref 5.0–8.0)

## 2015-06-09 NOTE — ED Provider Notes (Signed)
[facility] Emergency Department Provider Note  ____________________________________________  Time seen: 2:15 PM  I have reviewed the triage vital signs and the nursing notes.   HISTORY  Chief Complaint Abdominal Pain    HPI Cheryl Fitzgerald is a 20 y.o. female who presents with complaints of vaginal bleeding. Patient had vaginal delivery of a term infant on 04/07/2015. She has been doing well since then however 3 weeks ago she started to have vaginal bleeding which has continued intermittently. She reports it ranges from mild to medium. She denies dizziness. She has occasionally had some lower abdominal cramping but not today. She has not followed up with her gynecologist.     Past Medical History  Diagnosis Date  . Depression     no medication  . Adjustment disorder with mixed emotional features 03/23/2015    Patient Active Problem List   Diagnosis Date Noted  . Post-dates pregnancy 04/05/2015  . Adjustment disorder with mixed emotional features 03/23/2015  . Depression complicating pregnancy, antepartum 03/22/2015  . Supervision of high risk pregnancy due to social problems in third trimester 03/22/2015  . 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 Inadequate housing 03/22/2015  . Food hunger 03/22/2015  . Decreased fetal movement determined by examination 03/06/2015    Past Surgical History  Procedure Laterality Date  . No past surgeries      Current Outpatient Rx  Name  Route  Sig  Dispense  Refill  . acetaminophen (TYLENOL) 500 MG tablet   Oral   Take 500 mg by mouth every 6 (six) hours as needed for moderate pain or headache.         . guaiFENesin (ROBITUSSIN) 100 MG/5ML SOLN   Oral   Take 5 mLs (100 mg total) by mouth every 4 (four) hours as needed for cough or to loosen phlegm.   1200 mL   0   . oxyCODONE-acetaminophen (PERCOCET/ROXICET) 5-325 MG tablet   Oral   Take 1 tablet by mouth every 4 (four) hours as needed for severe  pain.   10 tablet   0   . permethrin (ELIMITE) 1 % lotion      Shampoo, rinse and towel dry hair, saturate hair and scalp with permethrin. Rinse after 10 min; repeat in 1 week if needed   59 mL   0   . Prenatal Vit-Fe Fumarate-FA (MULTIVITAMIN-PRENATAL) 27-0.8 MG TABS tablet   Oral   Take 1 tablet by mouth daily at 12 noon.         . rifampin (RIFADIN) 300 MG capsule   Oral   Take 2 capsules (600 mg total) by mouth daily.   60 capsule   1     Allergies Review of patient's allergies indicates no known allergies.  History reviewed. No pertinent family history.  Social History Social History  Substance Use Topics  . Smoking status: Former Games developer  . Smokeless tobacco: Never Used  . Alcohol Use: No    Review of Systems  Constitutional: Negative for fever. Negative for dizziness Eyes: Negative for visual changes. ENT: Negative for sore throat Cardiovascular: Negative for chest pain. Respiratory: Negative for shortness of breath. Gastrointestinal: As above Genitourinary: Negative for dysuria. Positive for vaginal bleeding Musculoskeletal: Negative for back pain. Skin: Negative for pallor Neurological: Negative for headaches or focal weakness Psychiatric: Positive for anxiety    ____________________________________________   PHYSICAL EXAM:  VITAL SIGNS: ED Triage Vitals  Enc Vitals Group     BP 06/09/15 1323 119/67 mmHg  Pulse Rate 06/09/15 1323 81     Resp 06/09/15 1323 18     Temp 06/09/15 1323 98.3 F (36.8 C)     Temp Source 06/09/15 1323 Oral     SpO2 06/09/15 1323 100 %     Weight 06/09/15 1323 185 lb (83.915 kg)     Height 06/09/15 1323 5\' 6"  (1.676 m)     Head Cir --      Peak Flow --      Pain Score 06/09/15 1319 8     Pain Loc --      Pain Edu? --      Excl. in GC? --      Constitutional: Alert and oriented. Well appearing and in no distress. Eyes: Conjunctivae are normal.  ENT   Head: Normocephalic and atraumatic.    Mouth/Throat: Mucous membranes are moist. Cardiovascular: Normal rate, regular rhythm. Normal and symmetric distal pulses are present in all extremities.  Respiratory: Normal respiratory effort without tachypnea nor retractions. Breath sounds are clear and equal bilaterally.  Gastrointestinal: Soft and non-tender in all quadrants. No distention. There is no CVA tenderness. Genitourinary: deferred Musculoskeletal: Nontender with normal range of motion in all extremities. No lower extremity tenderness nor edema. Neurologic:  Normal speech and language. No gross focal neurologic deficits are appreciated. Skin:  Skin is warm, dry and intact. No rash noted. Psychiatric: Mood and affect are normal. Patient exhibits appropriate insight and judgment.  ____________________________________________    LABS (pertinent positives/negatives)  Labs Reviewed  CBC WITH DIFFERENTIAL/PLATELET - Abnormal; Notable for the following:    RBC 5.28 (*)    All other components within normal limits  COMPREHENSIVE METABOLIC PANEL - Abnormal; Notable for the following:    Sodium 133 (*)    AST 84 (*)    ALT 117 (*)    All other components within normal limits  URINALYSIS COMPLETEWITH MICROSCOPIC ([facility] ONLY) - Abnormal; Notable for the following:    Color, Urine RED (*)    APPearance CLOUDY (*)    Glucose, UA 50 (*)    Hgb urine dipstick 3+ (*)    Protein, ur 100 (*)    Leukocytes, UA 1+ (*)    All other components within normal limits  PREGNANCY, URINE    ____________________________________________   EKG  None  ____________________________________________    RADIOLOGY I have personally reviewed any xrays that were ordered on this patient: None  ____________________________________________   PROCEDURES  Procedure(s) performed: none  Critical Care performed: none  ____________________________________________   INITIAL IMPRESSION / ASSESSMENT AND PLAN / ED COURSE  Pertinent labs &  imaging results that were available during my care of the patient were reviewed by me and considered in my medical decision making (see chart for details).  Patient with normal vitals, benign exam, normal hemoglobin. Negative pregnancy.  As this with Dr. Chauncey CruelStabler of OB/GYN, he does not recommend any hormonal treatment or imaging at this time. Recommended outpatient follow-up with her gynecologist   ____________________________________________   FINAL CLINICAL IMPRESSION(S) / ED DIAGNOSES  Final diagnoses:  DUB (dysfunctional uterine bleeding)     Jene Everyobert Sueann Brownley, MD 06/09/15 1443

## 2015-06-09 NOTE — ED Notes (Signed)
States 2 months postpartum - had one period since that was normal. This period has been for 3 weeks.

## 2015-06-09 NOTE — Discharge Instructions (Signed)

## 2015-08-07 ENCOUNTER — Encounter: Payer: Self-pay | Admitting: Radiology

## 2015-08-07 ENCOUNTER — Emergency Department: Payer: Self-pay

## 2015-08-07 ENCOUNTER — Inpatient Hospital Stay
Admission: EM | Admit: 2015-08-07 | Discharge: 2015-08-10 | DRG: 419 | Disposition: A | Discharge status: Home / Self Care | Payer: Self-pay | Attending: Surgery | Admitting: Surgery

## 2015-08-07 DIAGNOSIS — K8044 Calculus of bile duct with chronic cholecystitis without obstruction: Secondary | ICD-10-CM | POA: Diagnosis present

## 2015-08-07 DIAGNOSIS — R932 Abnormal findings on diagnostic imaging of liver and biliary tract: Secondary | ICD-10-CM | POA: Insufficient documentation

## 2015-08-07 DIAGNOSIS — Z87891 Personal history of nicotine dependence: Secondary | ICD-10-CM

## 2015-08-07 DIAGNOSIS — Z419 Encounter for procedure for purposes other than remedying health state, unspecified: Secondary | ICD-10-CM

## 2015-08-07 DIAGNOSIS — K8001 Calculus of gallbladder with acute cholecystitis with obstruction: Secondary | ICD-10-CM | POA: Diagnosis present

## 2015-08-07 DIAGNOSIS — K8040 Calculus of bile duct with cholecystitis, unspecified, without obstruction: Secondary | ICD-10-CM

## 2015-08-07 DIAGNOSIS — K8050 Calculus of bile duct without cholangitis or cholecystitis without obstruction: Secondary | ICD-10-CM

## 2015-08-07 DIAGNOSIS — K8062 Calculus of gallbladder and bile duct with acute cholecystitis without obstruction: Principal | ICD-10-CM | POA: Diagnosis present

## 2015-08-07 DIAGNOSIS — F4323 Adjustment disorder with mixed anxiety and depressed mood: Secondary | ICD-10-CM | POA: Diagnosis present

## 2015-08-07 HISTORY — DX: Reserved for inherently not codable concepts without codable children: IMO0001

## 2015-08-07 LAB — COMPREHENSIVE METABOLIC PANEL
ALK PHOS: 92 U/L (ref 38–126)
ALT: 145 U/L — ABNORMAL HIGH (ref 14–54)
ANION GAP: 6 (ref 5–15)
AST: 76 U/L — ABNORMAL HIGH (ref 15–41)
Albumin: 4.5 g/dL (ref 3.5–5.0)
BILIRUBIN TOTAL: 0.7 mg/dL (ref 0.3–1.2)
BUN: 10 mg/dL (ref 6–20)
CALCIUM: 9.7 mg/dL (ref 8.9–10.3)
CO2: 29 mmol/L (ref 22–32)
Chloride: 103 mmol/L (ref 101–111)
Creatinine, Ser: 0.59 mg/dL (ref 0.44–1.00)
GFR calc non Af Amer: >60 mL/min (ref >60)
Glucose, Bld: 74 mg/dL (ref 65–99)
Potassium: 3.5 mmol/L (ref 3.5–5.1)
Sodium: 138 mmol/L (ref 135–145)
TOTAL PROTEIN: 7.9 g/dL (ref 6.5–8.1)

## 2015-08-07 LAB — URINALYSIS COMPLETE WITH MICROSCOPIC (ARMC ONLY)
Bacteria, UA: NONE SEEN
Bilirubin Urine: NEGATIVE
Glucose, UA: NEGATIVE mg/dL
Hgb urine dipstick: NEGATIVE
KETONES UR: NEGATIVE mg/dL
NITRITE: NEGATIVE
PH: 7 (ref 5.0–8.0)
PROTEIN: NEGATIVE mg/dL
SPECIFIC GRAVITY, URINE: 1.023 (ref 1.005–1.030)

## 2015-08-07 LAB — CBC
HCT: 42.2 % (ref 35.0–47.0)
HEMOGLOBIN: 14.2 g/dL (ref 12.0–16.0)
MCH: 26.3 pg (ref 26.0–34.0)
MCHC: 33.6 g/dL (ref 32.0–36.0)
MCV: 78.3 fL — ABNORMAL LOW (ref 80.0–100.0)
Platelets: 249 10*3/uL (ref 150–440)
RBC: 5.4 MIL/uL — AB (ref 3.80–5.20)
RDW: 13.8 % (ref 11.5–14.5)
WBC: 7.7 10*3/uL (ref 3.6–11.0)

## 2015-08-07 LAB — POCT PREGNANCY, URINE
Preg Test, Ur: NEGATIVE
Preg Test, Ur: NEGATIVE

## 2015-08-07 LAB — LIPASE, BLOOD: Lipase: 35 U/L (ref 11–51)

## 2015-08-07 MED ORDER — SODIUM CHLORIDE 0.9 % IV BOLUS (SEPSIS)
1000.0000 mL | Freq: Once | INTRAVENOUS | Status: AC
  Administered 2015-08-07: 1000 mL via INTRAVENOUS

## 2015-08-07 MED ORDER — DEXTROSE IN LACTATED RINGERS 5 % IV SOLN
INTRAVENOUS | Status: DC
  Administered 2015-08-07: 21:00:00 via INTRAVENOUS
  Administered 2015-08-08: 1000 mL via INTRAVENOUS
  Administered 2015-08-08 – 2015-08-09 (×3): via INTRAVENOUS
  Filled 2015-08-07 (×6): qty 500

## 2015-08-07 MED ORDER — SODIUM CHLORIDE 0.9 % IV SOLN
3.0000 g | Freq: Four times a day (QID) | INTRAVENOUS | Status: DC
  Administered 2015-08-07 – 2015-08-10 (×12): 3 g via INTRAVENOUS
  Filled 2015-08-07 (×20): qty 3

## 2015-08-07 MED ORDER — MORPHINE SULFATE (PF) 4 MG/ML IV SOLN
4.0000 mg | Freq: Once | INTRAVENOUS | Status: AC
  Administered 2015-08-07: 4 mg via INTRAVENOUS

## 2015-08-07 MED ORDER — ONDANSETRON HCL 4 MG PO TABS
4.0000 mg | ORAL_TABLET | Freq: Four times a day (QID) | ORAL | Status: DC | PRN
  Administered 2015-08-10: 4 mg via ORAL
  Filled 2015-08-07 (×2): qty 1

## 2015-08-07 MED ORDER — HYDROMORPHONE HCL 1 MG/ML IJ SOLN
1.0000 mg | INTRAMUSCULAR | Status: DC | PRN
  Administered 2015-08-07 – 2015-08-10 (×15): 1 mg via INTRAVENOUS
  Filled 2015-08-07 (×16): qty 1

## 2015-08-07 MED ORDER — HEPARIN SODIUM (PORCINE) 5000 UNIT/ML IJ SOLN
5000.0000 [IU] | Freq: Three times a day (TID) | INTRAMUSCULAR | Status: DC
  Administered 2015-08-08 – 2015-08-10 (×8): 5000 [IU] via SUBCUTANEOUS
  Filled 2015-08-07 (×10): qty 1

## 2015-08-07 MED ORDER — SODIUM CHLORIDE 0.9 % IV SOLN
INTRAVENOUS | Status: AC
  Administered 2015-08-07: 3 g via INTRAVENOUS
  Filled 2015-08-07: qty 3

## 2015-08-07 MED ORDER — IOHEXOL 300 MG/ML  SOLN
100.0000 mL | Freq: Once | INTRAMUSCULAR | Status: AC | PRN
  Administered 2015-08-07: 100 mL via INTRAVENOUS
  Filled 2015-08-07: qty 100

## 2015-08-07 MED ORDER — ONDANSETRON HCL 4 MG/2ML IJ SOLN
4.0000 mg | Freq: Once | INTRAMUSCULAR | Status: AC
  Administered 2015-08-07: 4 mg via INTRAVENOUS

## 2015-08-07 MED ORDER — INFLUENZA VAC SPLIT QUAD 0.5 ML IM SUSY
0.5000 mL | PREFILLED_SYRINGE | INTRAMUSCULAR | Status: AC
  Administered 2015-08-08: 0.5 mL via INTRAMUSCULAR
  Filled 2015-08-07: qty 0.5

## 2015-08-07 MED ORDER — IOHEXOL 240 MG/ML SOLN
25.0000 mL | Freq: Once | INTRAMUSCULAR | Status: AC | PRN
  Administered 2015-08-07: 25 mL via ORAL
  Filled 2015-08-07: qty 25

## 2015-08-07 MED ORDER — MORPHINE SULFATE (PF) 4 MG/ML IV SOLN
4.0000 mg | Freq: Once | INTRAVENOUS | Status: AC
  Administered 2015-08-07: 4 mg via INTRAVENOUS
  Filled 2015-08-07: qty 1

## 2015-08-07 MED ORDER — MORPHINE SULFATE (PF) 4 MG/ML IV SOLN
INTRAVENOUS | Status: AC
  Administered 2015-08-07: 4 mg via INTRAVENOUS
  Filled 2015-08-07: qty 1

## 2015-08-07 MED ORDER — ONDANSETRON HCL 4 MG/2ML IJ SOLN
4.0000 mg | Freq: Four times a day (QID) | INTRAMUSCULAR | Status: DC | PRN
  Administered 2015-08-08 – 2015-08-09 (×4): 4 mg via INTRAVENOUS
  Filled 2015-08-07 (×4): qty 2

## 2015-08-07 MED ORDER — ONDANSETRON HCL 4 MG/2ML IJ SOLN
INTRAMUSCULAR | Status: AC
  Administered 2015-08-07: 4 mg via INTRAVENOUS
  Filled 2015-08-07: qty 2

## 2015-08-07 NOTE — ED Notes (Signed)
Pt assisted to the bathroom.

## 2015-08-07 NOTE — ED Notes (Signed)
Pt reports that she has been vomiting since yesterday but also off and on 3x per week for a while. Pt screaming and moaning during assessment, saying she can't lie down. Point so epigastric area as spot of pain. Pt alert & oriented.

## 2015-08-07 NOTE — H&P (Signed)
Cheryl Fitzgerald is an 20 y.o. female.    Chief Complaint: epi Gastric pain  HPI: This 20 year old female patient of [place] dissent who presents with 1 week of abdominal pain centered in the epigastrium radiating through to her back it became acutely worse yesterday and she's been vomiting for 24 hours with severe nausea is been unable to keep anything down. She denies fevers or chills denies jaundice or acholic stools has never had an episode like this before denies melena or hematochezia she is 3 months postpartum with a new baby girl.  She not mentioned to me but in reviewing her records and discussed with Dr.Shavitz then clarified with her and me by telephone the patient had exposure possibly to tuberculosis when she lived in [place] and was placed on rifampin which she has completed a course after having 2 negative chest x-rays so she is no longer on rifampin for tuberculosis  Past Medical History  Diagnosis Date  . Depression     no medication  . Adjustment disorder with mixed emotional features 03/23/2015    Past Surgical History  Procedure Laterality Date  . No past surgeries      No family history on file. Social History:  reports that she has quit smoking. She has never used smokeless tobacco. She reports that she uses illicit drugs (Marijuana). She reports that she does not drink alcohol.  Allergies: No Known Allergies   (Not in a hospital admission)   Review of Systems  Constitutional: Negative for fever and chills.  HENT: Negative.   Eyes: Negative.   Respiratory: Negative.   Cardiovascular: Negative.   Gastrointestinal: Positive for nausea, vomiting and abdominal pain. Negative for heartburn, diarrhea, constipation, blood in stool and melena.  Genitourinary: Negative.   Musculoskeletal: Negative.   Skin: Negative.   Neurological: Negative.   Endo/Heme/Allergies: Negative.   Psychiatric/Behavioral: Negative.      Physical Exam:  BP 118/73 mmHg   Pulse 86  Temp(Src) 98.4 F (36.9 C) (Oral)  Resp 16  SpO2 95%  LMP  (LMP Unknown)  Breastfeeding? No  Physical Exam  Constitutional: She is oriented to person, place, and time and well-developed, well-nourished, and in no distress. No distress.  Uncomfortable-appearing  HENT:  Head: Normocephalic and atraumatic.  Eyes: Pupils are equal, round, and reactive to light. Right eye exhibits no discharge. Left eye exhibits no discharge. No scleral icterus.  Neck: Normal range of motion. Neck supple.  Cardiovascular: Normal rate, regular rhythm and normal heart sounds.   Pulmonary/Chest: Effort normal and breath sounds normal. No respiratory distress. She has no wheezes. She has no rales.  Abdominal: Soft. Bowel sounds are normal. She exhibits no distension. There is tenderness. There is no rebound and no guarding.  Tender in the epigastrium and right upper quadrant with a negative Murphy sign no CVA tenderness  Musculoskeletal: Normal range of motion. She exhibits no edema or tenderness.  Lymphadenopathy:    She has no cervical adenopathy.  Neurological: She is alert and oriented to person, place, and time.  Skin: Skin is warm and dry. No rash noted. She is not diaphoretic. No erythema.  Psychiatric: Mood and affect normal.  Vitals reviewed.       Results for orders placed or performed during the hospital encounter of 08/07/15 (from the past 48 hour(s))  Lipase, blood     Status: None   Collection Time: 08/07/15  4:16 PM  Result Value Ref Range   Lipase 35 11 - 51 U/L  Comprehensive metabolic panel     Status: Abnormal   Collection Time: 08/07/15  4:16 PM  Result Value Ref Range   Sodium 138 135 - 145 mmol/L   Potassium 3.5 3.5 - 5.1 mmol/L   Chloride 103 101 - 111 mmol/L   CO2 29 22 - 32 mmol/L   Glucose, Bld 74 65 - 99 mg/dL   BUN 10 6 - 20 mg/dL   Creatinine, Ser 0.59 0.44 - 1.00 mg/dL   Calcium 9.7 8.9 - 10.3 mg/dL   Total Protein 7.9 6.5 - 8.1 g/dL   Albumin 4.5 3.5 -  5.0 g/dL   AST 76 (H) 15 - 41 U/L   ALT 145 (H) 14 - 54 U/L   Alkaline Phosphatase 92 38 - 126 U/L   Total Bilirubin 0.7 0.3 - 1.2 mg/dL   GFR calc non Af Amer >60 >60 mL/min   GFR calc Af Amer >60 >60 mL/min    Comment: (NOTE) The eGFR has been calculated using the CKD EPI equation. This calculation has not been validated in all clinical situations. eGFR's persistently <60 mL/min signify possible Chronic Kidney Disease.    Anion gap 6 5 - 15  CBC     Status: Abnormal   Collection Time: 08/07/15  4:16 PM  Result Value Ref Range   WBC 7.7 3.6 - 11.0 K/uL   RBC 5.40 (H) 3.80 - 5.20 MIL/uL   Hemoglobin 14.2 12.0 - 16.0 g/dL   HCT 42.2 35.0 - 47.0 %   MCV 78.3 (L) 80.0 - 100.0 fL   MCH 26.3 26.0 - 34.0 pg   MCHC 33.6 32.0 - 36.0 g/dL   RDW 13.8 11.5 - 14.5 %   Platelets 249 150 - 440 K/uL  Urinalysis complete, with microscopic ([facility] only)     Status: Abnormal   Collection Time: 08/07/15  4:16 PM  Result Value Ref Range   Color, Urine YELLOW (A) YELLOW   APPearance HAZY (A) CLEAR   Glucose, UA NEGATIVE NEGATIVE mg/dL   Bilirubin Urine NEGATIVE NEGATIVE   Ketones, ur NEGATIVE NEGATIVE mg/dL   Specific Gravity, Urine 1.023 1.005 - 1.030   Hgb urine dipstick NEGATIVE NEGATIVE   pH 7.0 5.0 - 8.0   Protein, ur NEGATIVE NEGATIVE mg/dL   Nitrite NEGATIVE NEGATIVE   Leukocytes, UA TRACE (A) NEGATIVE   RBC / HPF 0-5 0 - 5 RBC/hpf   WBC, UA 0-5 0 - 5 WBC/hpf   Bacteria, UA NONE SEEN NONE SEEN   Squamous Epithelial / LPF 0-5 (A) NONE SEEN   Mucous PRESENT   Pregnancy, urine POC     Status: None   Collection Time: 08/07/15  4:25 PM  Result Value Ref Range   Preg Test, Ur NEGATIVE NEGATIVE    Comment:        THE SENSITIVITY OF THIS METHODOLOGY IS >24 mIU/mL   Pregnancy, urine POC     Status: None   Collection Time: 08/07/15  4:29 PM  Result Value Ref Range   Preg Test, Ur NEGATIVE NEGATIVE    Comment:        THE SENSITIVITY OF THIS METHODOLOGY IS >24 mIU/mL    Ct Abdomen  Pelvis W Contrast  08/07/2015  CLINICAL DATA:  20 year old female with upper abdominal pain. History of gastroesophageal reflux. Negative urine pregnancy test. EXAM: CT ABDOMEN AND PELVIS WITH CONTRAST TECHNIQUE: Multidetector CT imaging of the abdomen and pelvis was performed using the standard protocol following bolus administration of intravenous contrast. CONTRAST:  177m OMNIPAQUE IOHEXOL 300 MG/ML  SOLN COMPARISON:  None. FINDINGS: Lower chest: No significant pulmonary nodules or acute consolidative airspace disease. Hepatobiliary: Normal liver with no liver mass. Distended gallbladder with multiple tiny layering calcified gallstones. No gallbladder wall thickening or pericholecystic fluid. Mild diffuse intrahepatic biliary ductal dilatation. Dilated common bile duct (9 mm diameter). Multiple faint densities in the lower third of the common bile duct to suggest choledocholithiasis. Pancreas: Normal, with no mass or duct dilation. Spleen: Normal size. No mass. Adrenals/Urinary Tract: Normal adrenals. Normal kidneys with no hydronephrosis and no renal mass. Normal bladder. Stomach/Bowel: Grossly normal stomach. Normal caliber small bowel with no small bowel wall thickening. Normal appendix. Normal large bowel with no diverticulosis, large bowel wall thickening or pericolonic fat stranding. Vascular/Lymphatic: Normal caliber abdominal aorta. Patent portal, splenic, hepatic and renal veins. Circumaortic left renal vein. No pathologically enlarged lymph nodes in the abdomen or pelvis. Reproductive: Grossly normal retroverted uterus.  No adnexal mass. Other: No pneumoperitoneum, ascites or focal fluid collection. Musculoskeletal: No aggressive appearing focal osseous lesions. IMPRESSION: 1. Cholelithiasis. Distended gallbladder. Dilated common bile duct (9 mm diameter) and mild diffuse intrahepatic biliary ductal dilatation. Multiple faint densities in the lower third of the common bile duct suggest obstructing  choledocholithiasis. Recommend correlation with MRCP and/or ERCP. 2. Otherwise normal CT of the abdomen and pelvis. Electronically Signed   By: JIlona SorrelM.D.   On: 08/07/2015 18:46     Assessment/Plan  This patient with choledocholithiasis. Her liver function tests are not severely elevated but she's been ill with nausea vomiting for at least a week and now worsening over the last 24 hours she warrants admission to the hospital with IV antibiotics IV hydration and control of her pain and nausea. Depending on the results of her liver function tests she can proceed then with laparoscopically cystectomy and cholangiography. This is described and discussed with her and her family the potential for an ERCP was discussed as well of her LFTs do not improve  RFlorene Glen MD, FACS

## 2015-08-07 NOTE — ED Provider Notes (Signed)
[facility] Emergency Department Provider Note  ____________________________________________  Time seen: Approximately 550 PM  I have reviewed the triage vital signs and the nursing notes.   HISTORY  Chief Complaint Possible Pregnancy and Emesis    HPI Cheryl Fitzgerald is a 20 y.o. female without any chronic medical conditions who is presenting today with 1 week of upper abdominal pain. She says that the pain is a 9 out of 10 and worsened with eating. She has had multiple episodes of vomiting. Says that the pain is stabbing. She has a history of gallstones and her family. No chest pain or shortness of breath. Says that the pain radiates to her back.   Past Medical History  Diagnosis Date  . Depression     no medication  . Adjustment disorder with mixed emotional features 03/23/2015    Patient Active Problem List   Diagnosis Date Noted  . Post-dates pregnancy 04/05/2015  . Adjustment disorder with mixed emotional features 03/23/2015  . Depression complicating pregnancy, antepartum 03/22/2015  . Supervision of high risk pregnancy due to social problems in third trimester 03/22/2015  . 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 Inadequate housing 03/22/2015  . Food hunger 03/22/2015  . Decreased fetal movement determined by examination 03/06/2015    Past Surgical History  Procedure Laterality Date  . No past surgeries      Current Outpatient Rx  Name  Route  Sig  Dispense  Refill  . acetaminophen (TYLENOL) 500 MG tablet   Oral   Take 500 mg by mouth every 6 (six) hours as needed for moderate pain or headache.         . guaiFENesin (ROBITUSSIN) 100 MG/5ML SOLN   Oral   Take 5 mLs (100 mg total) by mouth every 4 (four) hours as needed for cough or to loosen phlegm.   1200 mL   0   . oxyCODONE-acetaminophen (PERCOCET/ROXICET) 5-325 MG tablet   Oral   Take 1 tablet by mouth every 4 (four) hours as needed for severe pain.   10 tablet   0    . permethrin (ELIMITE) 1 % lotion      Shampoo, rinse and towel dry hair, saturate hair and scalp with permethrin. Rinse after 10 min; repeat in 1 week if needed   59 mL   0   . Prenatal Vit-Fe Fumarate-FA (MULTIVITAMIN-PRENATAL) 27-0.8 MG TABS tablet   Oral   Take 1 tablet by mouth daily at 12 noon.         . rifampin (RIFADIN) 300 MG capsule   Oral   Take 2 capsules (600 mg total) by mouth daily.   60 capsule   1     Allergies Review of patient's allergies indicates no known allergies.  No family history on file.  Social History Social History  Substance Use Topics  . Smoking status: Former Games developer  . Smokeless tobacco: Never Used  . Alcohol Use: No    Review of Systems Constitutional: No fever/chills Eyes: No visual changes. ENT: No sore throat. Cardiovascular: Denies chest pain. Respiratory: Denies shortness of breath. Gastrointestinal: No diarrhea.  No constipation. Genitourinary: Negative for dysuria. Musculoskeletal: Negative for back pain. Skin: Negative for rash. Neurological: Negative for headaches, focal weakness or numbness.  10-point ROS otherwise negative.  ____________________________________________   PHYSICAL EXAM:  VITAL SIGNS: ED Triage Vitals  Enc Vitals Group     BP 08/07/15 1614 106/76 mmHg     Pulse Rate 08/07/15 1614 81  Resp 08/07/15 1614 16     Temp 08/07/15 1614 99.1 F (37.3 C)     Temp Source 08/07/15 1614 Oral     SpO2 08/07/15 1614 98 %     Weight --      Height --      Head Cir --      Peak Flow --      Pain Score 08/07/15 1614 8     Pain Loc --      Pain Edu? --      Excl. in GC? --     Constitutional: Alert and oriented. Distressed. Says cannot lay down. Eyes: Conjunctivae are normal. PERRL. EOMI. Head: Atraumatic. Nose: No congestion/rhinnorhea. Mouth/Throat: Mucous membranes are moist.   Neck: No stridor.   Cardiovascular: Normal rate, regular rhythm. Grossly normal heart sounds.  Good peripheral  circulation. Respiratory: Normal respiratory effort.  No retractions. Lungs CTAB. Gastrointestinal: Soft with tenderness across the upper abdomen without a positive Murphy sign. No distention.  No CVA tenderness. Musculoskeletal: No lower extremity tenderness nor edema.  No joint effusions. Neurologic:  Normal speech and language. No gross focal neurologic deficits are appreciated.  Skin:  Skin is warm, dry and intact. No rash noted. Psychiatric: Mood and affect are normal. Speech and behavior are normal.  ____________________________________________   LABS (all labs ordered are listed, but only abnormal results are displayed)  Labs Reviewed  COMPREHENSIVE METABOLIC PANEL - Abnormal; Notable for the following:    AST 76 (*)    ALT 145 (*)    All other components within normal limits  CBC - Abnormal; Notable for the following:    RBC 5.40 (*)    MCV 78.3 (*)    All other components within normal limits  URINALYSIS COMPLETEWITH MICROSCOPIC ([facility] ONLY) - Abnormal; Notable for the following:    Color, Urine YELLOW (*)    APPearance HAZY (*)    Leukocytes, UA TRACE (*)    Squamous Epithelial / LPF 0-5 (*)    All other components within normal limits  LIPASE, BLOOD  POCT PREGNANCY, URINE  POCT PREGNANCY, URINE  POC URINE PREG, ED   ____________________________________________  EKG   ____________________________________________  RADIOLOGY  IMPRESSION: 1. Cholelithiasis. Distended gallbladder. Dilated common bile duct (9 mm diameter) and mild diffuse intrahepatic biliary ductal dilatation. Multiple faint densities in the lower third of the common bile duct suggest obstructing choledocholithiasis. Recommend correlation with MRCP and/or ERCP. 2. Otherwise normal CT of the abdomen and pelvis.   Electronically Signed By: Delbert Phenix M.D. On: 08/07/2015  18:46 ____________________________________________   PROCEDURES    ____________________________________________   INITIAL IMPRESSION / ASSESSMENT AND PLAN / ED COURSE  Pertinent labs & imaging results that were available during my care of the patient were reviewed by me and considered in my medical decision making (see chart for details).  ----------------------------------------- 7:08 PM on 08/07/2015 -----------------------------------------  Discussed case with Dr. Excell Seltzer of surgery who will be down to admit the patient. I also discussed the findings with the patient and she understands the need for addition to the hospital. She says that after 4 mg of morphine her pain is down from an 8-1/2 to a 9 to a 7. We will re-dose her morphine. ____________________________________________   FINAL CLINICAL IMPRESSION(S) / ED DIAGNOSES  Choledocholithiasis.    Myrna Blazer, MD 08/07/15 [phone]

## 2015-08-07 NOTE — ED Notes (Signed)
Pt reports that she feels that she may be pregnant.  Also reports eating something last night that made her vomit.  Has been vomiting since with some occassional abdominal pain.

## 2015-08-08 ENCOUNTER — Inpatient Hospital Stay: Payer: Self-pay

## 2015-08-08 ENCOUNTER — Encounter
Admission: EM | Disposition: A | Discharge status: Home / Self Care | Payer: Self-pay | Source: Home / Self Care | Attending: Surgery

## 2015-08-08 ENCOUNTER — Inpatient Hospital Stay: Payer: Self-pay | Admitting: Anesthesiology

## 2015-08-08 ENCOUNTER — Encounter: Payer: Self-pay | Admitting: *Deleted

## 2015-08-08 DIAGNOSIS — K8001 Calculus of gallbladder with acute cholecystitis with obstruction: Secondary | ICD-10-CM | POA: Diagnosis present

## 2015-08-08 HISTORY — PX: CHOLECYSTECTOMY: SHX55

## 2015-08-08 LAB — COMPREHENSIVE METABOLIC PANEL
ALBUMIN: 3.8 g/dL (ref 3.5–5.0)
ALK PHOS: 115 U/L (ref 38–126)
ALT: 469 U/L — AB (ref 14–54)
AST: 473 U/L — AB (ref 15–41)
Anion gap: 5 (ref 5–15)
BILIRUBIN TOTAL: 1 mg/dL (ref 0.3–1.2)
BUN: 7 mg/dL (ref 6–20)
CALCIUM: 8.6 mg/dL — AB (ref 8.9–10.3)
CO2: 27 mmol/L (ref 22–32)
CREATININE: 0.59 mg/dL (ref 0.44–1.00)
Chloride: 110 mmol/L (ref 101–111)
GFR calc Af Amer: >60 mL/min (ref >60)
GLUCOSE: 110 mg/dL — AB (ref 65–99)
POTASSIUM: 3.7 mmol/L (ref 3.5–5.1)
Sodium: 142 mmol/L (ref 135–145)
TOTAL PROTEIN: 6.2 g/dL — AB (ref 6.5–8.1)

## 2015-08-08 LAB — CBC
HEMATOCRIT: 37.7 % (ref 35.0–47.0)
Hemoglobin: 12.9 g/dL (ref 12.0–16.0)
MCH: 26.6 pg (ref 26.0–34.0)
MCHC: 34.2 g/dL (ref 32.0–36.0)
MCV: 77.6 fL — ABNORMAL LOW (ref 80.0–100.0)
PLATELETS: 205 10*3/uL (ref 150–440)
RBC: 4.86 MIL/uL (ref 3.80–5.20)
RDW: 13.6 % (ref 11.5–14.5)
WBC: 3.6 10*3/uL (ref 3.6–11.0)

## 2015-08-08 LAB — LIPASE, BLOOD: Lipase: 26 U/L (ref 11–51)

## 2015-08-08 LAB — URINE DRUG SCREEN, QUALITATIVE (ARMC ONLY)
AMPHETAMINES, UR SCREEN: NOT DETECTED
BENZODIAZEPINE, UR SCRN: NOT DETECTED
Barbiturates, Ur Screen: NOT DETECTED
COCAINE METABOLITE, UR ~~LOC~~: NOT DETECTED
Cannabinoid 50 Ng, Ur ~~LOC~~: NOT DETECTED
MDMA (ECSTASY) UR SCREEN: NOT DETECTED
METHADONE SCREEN, URINE: NOT DETECTED
OPIATE, UR SCREEN: NOT DETECTED
PHENCYCLIDINE (PCP) UR S: NOT DETECTED
Tricyclic, Ur Screen: NOT DETECTED

## 2015-08-08 SURGERY — LAPAROSCOPIC CHOLECYSTECTOMY WITH INTRAOPERATIVE CHOLANGIOGRAM
Anesthesia: General

## 2015-08-08 MED ORDER — GLUCAGON HCL RDNA (DIAGNOSTIC) 1 MG IJ SOLR
INTRAMUSCULAR | Status: AC
  Filled 2015-08-08: qty 1

## 2015-08-08 MED ORDER — GLUCAGON HCL RDNA (DIAGNOSTIC) 1 MG IJ SOLR
INTRAMUSCULAR | Status: DC | PRN
  Administered 2015-08-08: 1 mg via INTRAVENOUS

## 2015-08-08 MED ORDER — BUPIVACAINE-EPINEPHRINE (PF) 0.25% -1:200000 IJ SOLN
INTRAMUSCULAR | Status: AC
  Filled 2015-08-08: qty 30

## 2015-08-08 MED ORDER — NEOSTIGMINE METHYLSULFATE 10 MG/10ML IV SOLN
INTRAVENOUS | Status: DC | PRN
  Administered 2015-08-08: 4 mg via INTRAVENOUS

## 2015-08-08 MED ORDER — ROCURONIUM BROMIDE 100 MG/10ML IV SOLN
INTRAVENOUS | Status: DC | PRN
  Administered 2015-08-08: 10 mg via INTRAVENOUS
  Administered 2015-08-08: 40 mg via INTRAVENOUS

## 2015-08-08 MED ORDER — HYDROMORPHONE HCL 1 MG/ML IJ SOLN
INTRAMUSCULAR | Status: AC
Start: 2015-08-08 — End: 2015-08-08
  Administered 2015-08-08: 0.5 mg via INTRAVENOUS
  Filled 2015-08-08: qty 1

## 2015-08-08 MED ORDER — MIDAZOLAM HCL 2 MG/2ML IJ SOLN
INTRAMUSCULAR | Status: DC | PRN
  Administered 2015-08-08 (×2): 2 mg via INTRAVENOUS

## 2015-08-08 MED ORDER — BUPIVACAINE HCL (PF) 0.25 % IJ SOLN
INTRAMUSCULAR | Status: AC
  Filled 2015-08-08: qty 30

## 2015-08-08 MED ORDER — KETOROLAC TROMETHAMINE 30 MG/ML IJ SOLN
INTRAMUSCULAR | Status: DC | PRN
  Administered 2015-08-08: 30 mg via INTRAVENOUS

## 2015-08-08 MED ORDER — ONDANSETRON HCL 4 MG/2ML IJ SOLN
4.0000 mg | Freq: Once | INTRAMUSCULAR | Status: DC | PRN
Start: 2015-08-08 — End: 2015-08-08

## 2015-08-08 MED ORDER — LACTATED RINGERS IV SOLN
INTRAVENOUS | Status: DC
  Administered 2015-08-08 – 2015-08-10 (×3): via INTRAVENOUS

## 2015-08-08 MED ORDER — GLYCOPYRROLATE 0.2 MG/ML IJ SOLN
INTRAMUSCULAR | Status: DC | PRN
  Administered 2015-08-08: 0.6 mg via INTRAVENOUS

## 2015-08-08 MED ORDER — FENTANYL CITRATE (PF) 100 MCG/2ML IJ SOLN
25.0000 ug | INTRAMUSCULAR | Status: DC | PRN
  Administered 2015-08-08 (×4): 25 ug via INTRAVENOUS

## 2015-08-08 MED ORDER — ALPRAZOLAM 0.5 MG PO TABS: 0.5000 mg | ORAL_TABLET | Freq: Three times a day (TID) | ORAL | Status: DC | PRN

## 2015-08-08 MED ORDER — FENTANYL CITRATE (PF) 100 MCG/2ML IJ SOLN
INTRAMUSCULAR | Status: DC | PRN
  Administered 2015-08-08 (×2): 50 ug via INTRAVENOUS
  Administered 2015-08-08: 150 ug via INTRAVENOUS

## 2015-08-08 MED ORDER — PROPOFOL 10 MG/ML IV BOLUS
INTRAVENOUS | Status: DC | PRN
  Administered 2015-08-08: 120 mg via INTRAVENOUS

## 2015-08-08 MED ORDER — LIDOCAINE HCL (CARDIAC) 20 MG/ML IV SOLN
INTRAVENOUS | Status: DC | PRN
  Administered 2015-08-08: 10 mg via INTRAVENOUS

## 2015-08-08 MED ORDER — HYDROMORPHONE HCL 1 MG/ML IJ SOLN
0.2500 mg | INTRAMUSCULAR | Status: DC | PRN
  Administered 2015-08-08 (×2): 0.5 mg via INTRAVENOUS
  Administered 2015-08-08 (×2): 0.25 mg via INTRAVENOUS
  Administered 2015-08-08: 0.5 mg via INTRAVENOUS

## 2015-08-08 MED ORDER — GADOBENATE DIMEGLUMINE 529 MG/ML IV SOLN
20.0000 mL | Freq: Once | INTRAVENOUS | Status: AC | PRN
  Administered 2015-08-08: 17 mL via INTRAVENOUS

## 2015-08-08 MED ORDER — FENTANYL CITRATE (PF) 100 MCG/2ML IJ SOLN
INTRAMUSCULAR | Status: AC
  Administered 2015-08-08: 25 ug via INTRAVENOUS
  Filled 2015-08-08: qty 2

## 2015-08-08 MED ORDER — ONDANSETRON HCL 4 MG/2ML IJ SOLN
INTRAMUSCULAR | Status: DC | PRN
  Administered 2015-08-08: 4 mg via INTRAVENOUS

## 2015-08-08 MED ORDER — SODIUM CHLORIDE 0.9 % IR SOLN
Status: DC | PRN
  Administered 2015-08-08: 300 mL

## 2015-08-08 MED ORDER — HYDROMORPHONE HCL 1 MG/ML IJ SOLN
INTRAMUSCULAR | Status: AC
  Administered 2015-08-08: 0.5 mg via INTRAVENOUS
  Filled 2015-08-08: qty 1

## 2015-08-08 MED ORDER — DIPHENHYDRAMINE HCL 50 MG/ML IJ SOLN
50.0000 mg | Freq: Once | INTRAMUSCULAR | Status: AC
  Administered 2015-08-09: 50 mg via INTRAVENOUS
  Filled 2015-08-08: qty 1

## 2015-08-08 MED ORDER — SODIUM CHLORIDE 0.9 % IV SOLN
INTRAVENOUS | Status: DC | PRN
  Administered 2015-08-08: 50 mL

## 2015-08-08 MED ORDER — BUPIVACAINE-EPINEPHRINE 0.25% -1:200000 IJ SOLN
INTRAMUSCULAR | Status: DC | PRN
  Administered 2015-08-08: 30 mL

## 2015-08-08 MED ORDER — DEXAMETHASONE SODIUM PHOSPHATE 10 MG/ML IJ SOLN
INTRAMUSCULAR | Status: DC | PRN
  Administered 2015-08-08: 10 mg via INTRAVENOUS

## 2015-08-08 SURGICAL SUPPLY — 41 items
APPLIER CLIP 5 13 M/L LIGAMAX5 (MISCELLANEOUS) ×2
CANISTER SUCT 1200ML W/VALVE (MISCELLANEOUS) ×2 IMPLANT
CHLORAPREP W/TINT 26ML (MISCELLANEOUS) ×2 IMPLANT
CHOLANGIOGRAM CATH TAUT (CATHETERS) IMPLANT
CLEANER CAUTERY TIP 5X5 PAD (MISCELLANEOUS) ×1 IMPLANT
CLIP APPLIE 5 13 M/L LIGAMAX5 (MISCELLANEOUS) ×1 IMPLANT
DECANTER SPIKE VIAL GLASS SM (MISCELLANEOUS) ×4 IMPLANT
DEVICE TROCAR PUNCTURE CLOSURE (ENDOMECHANICALS) IMPLANT
DRAPE C-ARM XRAY 36X54 (DRAPES) ×2 IMPLANT
ELECT REM PT RETURN 9FT ADLT (ELECTROSURGICAL) ×2
ELECTRODE REM PT RTRN 9FT ADLT (ELECTROSURGICAL) ×1 IMPLANT
ENDOPOUCH RETRIEVER 10 (MISCELLANEOUS) ×2 IMPLANT
GLOVE BIO SURGEON STRL SZ 6.5 (GLOVE) ×4 IMPLANT
GLOVE BIO SURGEON STRL SZ7 (GLOVE) ×6 IMPLANT
GLOVE BIO SURGEON STRL SZ7.5 (GLOVE) ×4 IMPLANT
GLOVE BIOGEL PI IND STRL 7.0 (GLOVE) ×2 IMPLANT
GLOVE BIOGEL PI INDICATOR 7.0 (GLOVE) ×2
GOWN STRL REUS W/ TWL LRG LVL3 (GOWN DISPOSABLE) ×5 IMPLANT
GOWN STRL REUS W/TWL LRG LVL3 (GOWN DISPOSABLE) ×5
IRRIGATION STRYKERFLOW (MISCELLANEOUS) ×1 IMPLANT
IRRIGATOR STRYKERFLOW (MISCELLANEOUS) ×2
IV SOD CHL 0.9% 1000ML (IV SOLUTION) ×2 IMPLANT
L-HOOK LAP DISP 36CM (ELECTROSURGICAL) ×2
LHOOK LAP DISP 36CM (ELECTROSURGICAL) ×1 IMPLANT
LIQUID BAND (GAUZE/BANDAGES/DRESSINGS) ×2 IMPLANT
NEEDLE HYPO 22GX1.5 SAFETY (NEEDLE) ×2 IMPLANT
PACK LAP CHOLECYSTECTOMY (MISCELLANEOUS) ×2 IMPLANT
PAD CLEANER CAUTERY TIP 5X5 (MISCELLANEOUS) ×1
PENCIL ELECTRO HAND CTR (MISCELLANEOUS) ×2 IMPLANT
SCISSORS METZENBAUM CVD 33 (INSTRUMENTS) IMPLANT
SLEEVE ADV FIXATION 5X100MM (TROCAR) ×4 IMPLANT
STOPCOCK 3 WAY  REPLAC (MISCELLANEOUS) IMPLANT
SUT ETHIBOND 0 MO6 C/R (SUTURE) IMPLANT
SUT MNCRL AB 4-0 PS2 18 (SUTURE) ×2 IMPLANT
SUT VIC AB 0 CT1 36 (SUTURE) IMPLANT
SUT VICRYL 0 AB UR-6 (SUTURE) ×4 IMPLANT
SYR 20CC LL (SYRINGE) ×4 IMPLANT
TROCAR XCEL BLUNT TIP 100MML (ENDOMECHANICALS) ×2 IMPLANT
TROCAR Z-THREAD OPTICAL 5X100M (TROCAR) ×2 IMPLANT
TUBING INSUFFLATOR HI FLOW (MISCELLANEOUS) ×2 IMPLANT
WATER STERILE IRR 1000ML POUR (IV SOLUTION) ×2 IMPLANT

## 2015-08-08 NOTE — Progress Notes (Signed)
Pastoral Care and prayer provided.  °

## 2015-08-08 NOTE — OR Nursing (Signed)
Patient is extremely anxious and difficult to calm.  She began hyperventilating with discussion of surgery.  When asked about her drug use, she was not sure if she has done anything else besides smoke marijuana.  She has concerns that something could be laced.  Urine obtained for UDS. Current heplock in left a/c causes her great pain.  She is unable to tolerate even a tourniquet on her arm to restart another iv.  Chaplain here to offer prayer and relaxation therapy.

## 2015-08-08 NOTE — Progress Notes (Signed)
The risks, benefits, complications, treatment options, and expected outcomes were discussed with the patient. The possibilities of bleeding, recurrent infection, finding a normal gallbladder, perforation of viscus organs, damage to surrounding structures, bile leak, abscess formation, needing a drain placed, the need for additional procedures, reaction to medication, pulmonary aspiration,  failure to diagnose a condition, the possible need to convert to an open procedure, and creating a complication requiring transfusion or operation were discussed with the patient. The patient and/or family concurred with the proposed plan, giving informed consent.  Plan for lap chole w IOC, questions answered

## 2015-08-08 NOTE — Op Note (Addendum)
Laparoscopic Cholecystectomy  Pre-operative Diagnosis: Acute cholecystitis  Post-operative Diagnosis: Acute cholecystitis with choledocholithiasis  Procedure: Laparoscopic cholecystectomy with intraoperative cholangiogram  Surgeon: Sterling Big, MD FACS  Anesthesia: Gen. with endotracheal tube  Findings:  Acute Cholecystitis  Cholangiogram with evidence of common bile duct dilation around 8 mm with a distal filling defect on the common bile duct. No evidence of common bile duct injury and adequate retrograde opacification  Estimated Blood Loss: 10 cc         Drains: none         Specimens: Gallbladder           Complications: none   Procedure Details  The patient was seen again in the Holding Room. The benefits, complications, treatment options, and expected outcomes were discussed with the patient. The risks of bleeding, infection, recurrence of symptoms, failure to resolve symptoms, bile duct damage, bile duct leak, retained common bile duct stone, bowel injury, any of which could require further surgery and/or ERCP, stent, or papillotomy were reviewed with the patient. The likelihood of improving the patient's symptoms with return to their baseline status is good.  The patient and/or family concurred with the proposed plan, giving informed consent.  The patient was taken to Operating Room, identified as MILEAH HEMMER and the procedure verified as Laparoscopic Cholecystectomy.  A Time Out was held and the above information confirmed.  Prior to the induction of general anesthesia, antibiotic prophylaxis was administered. VTE prophylaxis was in place. General endotracheal anesthesia was then administered and tolerated well. After the induction, the abdomen was prepped with Chloraprep and draped in the sterile fashion. The patient was positioned in the supine position.  Local anesthetic  was injected into the skin near the umbilicus and an incision made. Cut down technique was used to  enter the abdominal cavity and a Hasson trochar was placed after two vicryl stitches were anchored to the fascia. Pneumoperitoneum was then created with CO2 and tolerated well without any adverse changes in the patient's vital signs.  Three 5-mm ports were placed in the right upper quadrant all under direct vision. All skin incisions  were infiltrated with a local anesthetic agent before making the incision and placing the trocars.   The patient was positioned  in reverse Trendelenburg, tilted slightly to the patient's left.  The gallbladder was identified, the fundus grasped and retracted cephalad. Adhesions were lysed bluntly. The infundibulum was grasped and retracted laterally, exposing the peritoneum overlying the triangle of Calot. This was then divided and exposed in a blunt fashion. An extended critical view of the cystic duct and cystic artery was obtained.  The cystic duct was clearly identified and bluntly dissected. cholangiogram was performed showing evidence of dilated common bile duct with a distal filling defect. We gave glucagon and flushed the common bile duct with more than 100 cc repeat cholangiogram showed persistence of filling defect. Cholangiogram catheter removed and  cystic   Artery and duct were double clipped and divided.  The gallbladder was taken from the gallbladder fossa in a retrograde fashion with the electrocautery. The gallbladder was removed and placed in an Endocatch bag. The liver bed was irrigated and inspected. Hemostasis was achieved with the electrocautery. Copious irrigation was utilized and was repeatedly aspirated until clear.  The gallbladder and Endocatch sac were then removed through the epigastric port site.    Inspection of the right upper quadrant was performed. No bleeding, bile duct injury or leak, or bowel injury was noted. Pneumoperitoneum was released.  The periumbilical port site was closed with figure-of-eight 0 Vicryl sutures. 4-0 subcuticular  Monocryl was used to close the skin. Dermabond was  applied.  The patient was then extubated and brought to the recovery room in stable condition. Sponge, lap, and needle counts were correct at closure and at the conclusion of the case.  Case d/w Dr. Servando Snare , he will arrange for ERCP in am.      Sterling Big, MD, FACS

## 2015-08-08 NOTE — Anesthesia Procedure Notes (Signed)
Procedure Name: Intubation Date/Time: 08/08/2015 4:38 PM Performed by: Omer Jack Pre-anesthesia Checklist: Patient identified, Patient being monitored, Timeout performed, Emergency Drugs available and Suction available Patient Re-evaluated:Patient Re-evaluated prior to inductionOxygen Delivery Method: Circle system utilized Preoxygenation: Pre-oxygenation with 100% oxygen Intubation Type: IV induction Ventilation: Mask ventilation without difficulty Laryngoscope Size: Miller and 2 Grade View: Grade I Tube type: Oral Tube size: 7.0 mm Number of attempts: 1 Placement Confirmation: ETT inserted through vocal cords under direct vision,  positive ETCO2 and breath sounds checked- equal and bilateral Secured at: 21 cm Tube secured with: Tape Dental Injury: Teeth and Oropharynx as per pre-operative assessment

## 2015-08-08 NOTE — Anesthesia Preprocedure Evaluation (Addendum)
Anesthesia Evaluation  Patient identified by MRN, date of birth, ID band Patient awake    Reviewed: Allergy & Precautions, H&P , NPO status , Patient's Chart, lab work & pertinent test results, reviewed documented beta blocker date and time   History of Anesthesia Complications Negative for: history of anesthetic complications  Airway Mallampati: II  TM Distance: >3 FB Neck ROM: full    Dental  (+) Teeth Intact   Pulmonary neg pulmonary ROS, former smoker,    Pulmonary exam normal        Cardiovascular negative cardio ROS Normal cardiovascular exam Rhythm:regular Rate:Normal     Neuro/Psych PSYCHIATRIC DISORDERS negative neurological ROS  negative psych ROS   GI/Hepatic negative GI ROS, Neg liver ROS,   Endo/Other  negative endocrine ROS  Renal/GU negative Renal ROS  negative genitourinary   Musculoskeletal   Abdominal   Peds  Hematology negative hematology ROS (+)   Anesthesia Other Findings Past Medical History:   Depression                                                     Comment:no medication   Adjustment disorder with mixed emotional featu* 03/23/2015 Past Surgical History:   NO PAST SURGERIES                                           BMI    Body Mass Index   30.34 kg/m 2     Reproductive/Obstetrics negative OB ROS                            Anesthesia Physical Anesthesia Plan  ASA: II  Anesthesia Plan: General ETT   Post-op Pain Management:    Induction:   Airway Management Planned:   Additional Equipment:   Intra-op Plan:   Post-operative Plan:   Informed Consent: I have reviewed the patients History and Physical, chart, labs and discussed the procedure including the risks, benefits and alternatives for the proposed anesthesia with the patient or authorized representative who has indicated his/her understanding and acceptance.   Dental Advisory Given  Plan  Discussed with: CRNA  Anesthesia Plan Comments:         Anesthesia Quick Evaluation

## 2015-08-08 NOTE — Transfer of Care (Signed)
Immediate Anesthesia Transfer of Care Note  Patient: Cheryl Fitzgerald  Procedure(s) Performed: Procedure(s): LAPAROSCOPIC CHOLECYSTECTOMY WITH INTRAOPERATIVE CHOLANGIOGRAM (N/A)  Patient Location: PACU  Anesthesia Type:General  Level of Consciousness: sedated and responds to stimulation  Airway & Oxygen Therapy: Patient Spontanous Breathing and Patient connected to nasal cannula oxygen  Post-op Assessment: Report given to RN and Post -op Vital signs reviewed and stable  Post vital signs: Reviewed and stable  Last Vitals:  Filed Vitals:   08/08/15 1518 08/08/15 1814  BP: 143/99 125/72  Pulse: 81 104  Temp: 36.5 C 36.8 C  Resp:  18    Complications: No apparent anesthesia complications

## 2015-08-08 NOTE — Anesthesia Postprocedure Evaluation (Signed)
Anesthesia Post Note  Patient: Cheryl Fitzgerald  Procedure(s) Performed: Procedure(s) (LRB): LAPAROSCOPIC CHOLECYSTECTOMY WITH INTRAOPERATIVE CHOLANGIOGRAM (N/A)  Patient location during evaluation: PACU Anesthesia Type: General Level of consciousness: awake and alert Pain management: pain level controlled Vital Signs Assessment: post-procedure vital signs reviewed and stable Respiratory status: spontaneous breathing and respiratory function stable Cardiovascular status: stable Anesthetic complications: no    Last Vitals:  Filed Vitals:   08/08/15 2001 08/08/15 2102  BP: 125/72 116/67  Pulse: 61 80  Temp: 36.2 C 36.9 C  Resp: 17 16    Last Pain:  Filed Vitals:   08/08/15 2102  PainSc: 10-Worst pain ever                 Tambi Thole K

## 2015-08-09 ENCOUNTER — Encounter
Admission: EM | Disposition: A | Discharge status: Home / Self Care | Payer: Self-pay | Source: Home / Self Care | Attending: Surgery

## 2015-08-09 ENCOUNTER — Inpatient Hospital Stay: Payer: Self-pay | Admitting: Anesthesiology

## 2015-08-09 ENCOUNTER — Encounter: Payer: Self-pay | Admitting: Surgery

## 2015-08-09 DIAGNOSIS — R932 Abnormal findings on diagnostic imaging of liver and biliary tract: Secondary | ICD-10-CM | POA: Insufficient documentation

## 2015-08-09 DIAGNOSIS — R17 Unspecified jaundice: Secondary | ICD-10-CM

## 2015-08-09 DIAGNOSIS — K8050 Calculus of bile duct without cholangitis or cholecystitis without obstruction: Secondary | ICD-10-CM | POA: Insufficient documentation

## 2015-08-09 HISTORY — PX: ENDOSCOPIC RETROGRADE CHOLANGIOPANCREATOGRAPHY (ERCP) WITH PROPOFOL: SHX5810

## 2015-08-09 LAB — COMPREHENSIVE METABOLIC PANEL
ALBUMIN: 3.9 g/dL (ref 3.5–5.0)
ALT: 1079 U/L — ABNORMAL HIGH (ref 14–54)
ANION GAP: 7 (ref 5–15)
AST: 1083 U/L — ABNORMAL HIGH (ref 15–41)
Alkaline Phosphatase: 194 U/L — ABNORMAL HIGH (ref 38–126)
BILIRUBIN TOTAL: 2.7 mg/dL — AB (ref 0.3–1.2)
BUN: 5 mg/dL — ABNORMAL LOW (ref 6–20)
CALCIUM: 9.2 mg/dL (ref 8.9–10.3)
CO2: 24 mmol/L (ref 22–32)
Chloride: 109 mmol/L (ref 101–111)
Creatinine, Ser: 0.49 mg/dL (ref 0.44–1.00)
GFR calc non Af Amer: >60 mL/min (ref >60)
GLUCOSE: 171 mg/dL — AB (ref 65–99)
POTASSIUM: 4.1 mmol/L (ref 3.5–5.1)
SODIUM: 140 mmol/L (ref 135–145)
TOTAL PROTEIN: 7.1 g/dL (ref 6.5–8.1)

## 2015-08-09 SURGERY — ENDOSCOPIC RETROGRADE CHOLANGIOPANCREATOGRAPHY (ERCP) WITH PROPOFOL
Anesthesia: General

## 2015-08-09 MED ORDER — FENTANYL CITRATE (PF) 100 MCG/2ML IJ SOLN
INTRAMUSCULAR | Status: AC
  Administered 2015-08-09: 25 ug
  Filled 2015-08-09: qty 2

## 2015-08-09 MED ORDER — PROPOFOL 500 MG/50ML IV EMUL
INTRAVENOUS | Status: DC | PRN
  Administered 2015-08-09: 140 ug/kg/min via INTRAVENOUS

## 2015-08-09 MED ORDER — INDOMETHACIN 50 MG RE SUPP
100.0000 mg | Freq: Once | RECTAL | Status: AC
  Administered 2015-08-09: 100 mg via RECTAL
  Filled 2015-08-09 (×2): qty 2

## 2015-08-09 MED ORDER — LIDOCAINE HCL (CARDIAC) 20 MG/ML IV SOLN
INTRAVENOUS | Status: DC | PRN
Start: 2015-08-09 — End: 2015-08-09
  Administered 2015-08-09: 40 mg via INTRAVENOUS

## 2015-08-09 MED ORDER — SODIUM CHLORIDE 0.9 % IV SOLN
INTRAVENOUS | Status: DC | PRN
  Administered 2015-08-09: 16:00:00 via INTRAVENOUS

## 2015-08-09 MED ORDER — MIDAZOLAM HCL 5 MG/5ML IJ SOLN
INTRAMUSCULAR | Status: DC | PRN
  Administered 2015-08-09: 1 mg via INTRAVENOUS
  Administered 2015-08-09: .5 mg via INTRAVENOUS

## 2015-08-09 MED ORDER — FENTANYL CITRATE (PF) 100 MCG/2ML IJ SOLN: 25.0000 ug | INTRAMUSCULAR | Status: DC | PRN

## 2015-08-09 MED ORDER — FENTANYL CITRATE (PF) 100 MCG/2ML IJ SOLN
INTRAMUSCULAR | Status: DC | PRN
  Administered 2015-08-09: 50 ug via INTRAVENOUS

## 2015-08-09 MED ORDER — ALPRAZOLAM 0.5 MG PO TABS
0.5000 mg | ORAL_TABLET | Freq: Three times a day (TID) | ORAL | Status: DC | PRN
  Administered 2015-08-09 – 2015-08-10 (×4): 0.5 mg via ORAL
  Filled 2015-08-09 (×5): qty 1

## 2015-08-09 NOTE — Anesthesia Postprocedure Evaluation (Signed)
Anesthesia Post Note  Patient: Cheryl Fitzgerald  Procedure(s) Performed: Procedure(s) (LRB): ENDOSCOPIC RETROGRADE CHOLANGIOPANCREATOGRAPHY (ERCP) WITH PROPOFOL (N/A)  Patient location during evaluation: PACU Anesthesia Type: General Level of consciousness: awake and alert and oriented Pain management: pain level controlled Vital Signs Assessment: post-procedure vital signs reviewed and stable Respiratory status: spontaneous breathing Cardiovascular status: blood pressure returned to baseline Anesthetic complications: no    Last Vitals:  Filed Vitals:   08/09/15 1740 08/09/15 1750  BP: 126/78 128/84  Pulse: 66 69  Temp:    Resp: 16 20    Last Pain:  Filed Vitals:   08/09/15 1811  PainSc: 10-Worst pain ever                 Akasia Ahmad

## 2015-08-09 NOTE — Progress Notes (Signed)
Okay to place order for clear liquids per Dr. Servando SnareWohl

## 2015-08-09 NOTE — Op Note (Signed)
[facility] Gastroenterology Patient Name: Cheryl MatteKarin Skalski Procedure Date: 08/09/2015 4:25 PM MRN: [id number] Account #: [id number] Date of Birth: Feb 04, 1996 Admit Type: Inpatient Age: 89 Room[facility] ENDO ROOM 4 Gender: Female Note Status: Finalized Procedure:            ERCP Indications:          Filling defect on intraoperative cholangiogram,                        Suspected bile duct stone(s), Jaundice Providers:            Midge Miniumarren Hyde Sires, MD Medicines:            Propofol per Anesthesia Complications:        No immediate complications. Procedure:            Pre-Anesthesia Assessment:                       - Prior to the procedure, a History and Physical was                        performed, and patient medications and allergies were                        reviewed. The patient's tolerance of previous                        anesthesia was also reviewed. The risks and benefits of                        the procedure and the sedation options and risks were                        discussed with the patient. All questions were                        answered, and informed consent was obtained. Prior                        Anticoagulants: The patient has taken no previous                        anticoagulant or antiplatelet agents. ASA Grade                        Assessment: II - A patient with mild systemic disease.                        After reviewing the risks and benefits, the patient was                        deemed in satisfactory condition to undergo the                        procedure.                       After obtaining informed consent, the scope was passed                        under direct vision. Throughout the procedure,  the                        patient's blood pressure, pulse, and oxygen saturations                        were monitored continuously. The ERCP was introduced                        through the mouth, and used to inject contrast into  and                        used to inject contrast into the bile duct. The ERCP                        was accomplished without difficulty. The patient                        tolerated the procedure well. Findings:      A scout film of the abdomen was obtained. Surgical clips were seen in       the area of the cystic duct. The esophagus was successfully intubated       under direct vision. The scope was advanced to a normal major papilla in       the descending duodenum without detailed examination of the pharynx,       larynx and associated structures, and upper GI tract. The upper GI tract       was grossly normal. The bile duct was deeply cannulated. Contrast was       injected. I personally interpreted the bile duct images. There was brisk       flow of contrast through the ducts. Image quality was excellent.       Contrast extended to the entire biliary tree. The lower third of the       main bile duct contained filling defect(s) thought to be sludge. A wire       was passed into the biliary tree. Biliary sphincterotomy was made with a       traction (standard) sphincterotome using ERBE electrocautery. There was       no post-sphincterotomy bleeding. The biliary tree was swept with a 15 mm       balloon starting at the bifurcation. Sludge was swept from the duct. Impression:           - The examination was suspicious for sludge.                       - A biliary sphincterotomy was performed.                       - The biliary tree was swept and sludge was found. Recommendation:       - Watch for pancreatitis, bleeding, perforation, and                        cholangitis. Procedure Code(s):    --- Professional ---                       [phone], Endoscopic retrograde cholangiopancreatography                        (ERCP); with  removal of calculi/debris from                        biliary/pancreatic duct(s)                       [phone], Endoscopic retrograde cholangiopancreatography                         (ERCP); with sphincterotomy/papillotomy                       [phone], Endoscopic catheterization of the biliary ductal                        system, radiological supervision and interpretation Diagnosis Code(s):    --- Professional ---                       R93.2, Abnormal findings on diagnostic imaging of liver                        and biliary tract                       R17, Unspecified jaundice CPT copyright 2016 [employer]. All rights reserved. The codes documented in this report are preliminary and upon coder review may  be revised to meet current compliance requirements. Midge Minium, MD 08/09/2015 5:20:34 PM This report has been signed electronically. Number of Addenda: 0 Note Initiated On: 08/09/2015 4:25 PM      [facility]

## 2015-08-09 NOTE — Progress Notes (Signed)
1 Day Post-Op  Subjective: As well as laparoscopic cholecystectomy with cholangiography demonstrating retained stones for ERCP today by Dr. Servando SnareWohl. Patient is having acute epigastric pain at this point and is quite tearful. Has been medicated  Objective: Vital signs in last 24 hours: Temp:  [97.1 F (36.2 C)-99 F (37.2 C)] 97.9 F (36.6 C) (03/03 0419) Pulse Rate:  [59-104] 68 (03/03 0419) Resp:  [11-29] 20 (03/03 0419) BP: (102-143)/(67-99) 102/67 mmHg (03/03 0419) SpO2:  [98 %-100 %] 100 % (03/03 0419) Weight:  [187 lb (84.823 kg)] 187 lb (84.823 kg) (03/02 1518) Last BM Date: 08/07/15  Intake/Output from previous day: 03/02 0701 - 03/03 0700 In: 1835 [P.O.:240; I.V.:1595] Out: 2160 [Urine:2150; Blood:10] Intake/Output this shift: Total I/O In: 835 [P.O.:240; I.V.:595] Out: 2150 [Urine:2150]  Physical exam:  No icterus no jaundice Vital signs are reviewed Wounds are clean abdominal exam shows minimal epigastric tenderness no peritoneal signs Abs are nontender  Lab Results: CBC   Recent Labs  08/07/15 1616 08/08/15 0528  WBC 7.7 3.6  HGB 14.2 12.9  HCT 42.2 37.7  PLT 249 205   BMET  Recent Labs  08/08/15 0528 08/09/15 0536  NA 142 140  K 3.7 4.1  CL 110 109  CO2 27 24  GLUCOSE 110* 171*  BUN 7 5*  CREATININE 0.59 0.49  CALCIUM 8.6* 9.2   PT/INR No results for input(s): LABPROT, INR in the last 72 hours. ABG No results for input(s): PHART, HCO3 in the last 72 hours.  Invalid input(s): PCO2, PO2  Studies/Results: Dg Cholangiogram Operative  08/08/2015  CLINICAL DATA:  Cholelithiasis EXAM: INTRAOPERATIVE CHOLANGIOGRAM TECHNIQUE: Cholangiographic images from the C-arm fluoroscopic device were submitted for interpretation post-operatively. Please see the procedural report for the amount of contrast and the fluoroscopy time utilized. COMPARISON:  None. FINDINGS: Initial images demonstrate opacification of the intrahepatic and extrahepatic biliary tree. A  filling defect is noted in the distal common bile duct with only minimal passage of contrast material into the duodenum. Subsequent images demonstrate significant further filling of the common bile duct. Termination image shows a meniscus filling defect without evidence of passage of contrast into the duodenum. IMPRESSION: Changes consistent with a retained stone in the distal common bile duct causing distal obstruction. ERCP may be helpful. Electronically Signed   By: Alcide CleverMark  Lukens M.D.   On: 08/08/2015 18:22   Ct Abdomen Pelvis W Contrast  08/07/2015  CLINICAL DATA:  20 year old female with upper abdominal pain. History of gastroesophageal reflux. Negative urine pregnancy test. EXAM: CT ABDOMEN AND PELVIS WITH CONTRAST TECHNIQUE: Multidetector CT imaging of the abdomen and pelvis was performed using the standard protocol following bolus administration of intravenous contrast. CONTRAST:  100mL OMNIPAQUE IOHEXOL 300 MG/ML  SOLN COMPARISON:  None. FINDINGS: Lower chest: No significant pulmonary nodules or acute consolidative airspace disease. Hepatobiliary: Normal liver with no liver mass. Distended gallbladder with multiple tiny layering calcified gallstones. No gallbladder wall thickening or pericholecystic fluid. Mild diffuse intrahepatic biliary ductal dilatation. Dilated common bile duct (9 mm diameter). Multiple faint densities in the lower third of the common bile duct to suggest choledocholithiasis. Pancreas: Normal, with no mass or duct dilation. Spleen: Normal size. No mass. Adrenals/Urinary Tract: Normal adrenals. Normal kidneys with no hydronephrosis and no renal mass. Normal bladder. Stomach/Bowel: Grossly normal stomach. Normal caliber small bowel with no small bowel wall thickening. Normal appendix. Normal large bowel with no diverticulosis, large bowel wall thickening or pericolonic fat stranding. Vascular/Lymphatic: Normal caliber abdominal aorta. Patent portal, splenic,  hepatic and renal veins.  Circumaortic left renal vein. No pathologically enlarged lymph nodes in the abdomen or pelvis. Reproductive: Grossly normal retroverted uterus.  No adnexal mass. Other: No pneumoperitoneum, ascites or focal fluid collection. Musculoskeletal: No aggressive appearing focal osseous lesions. IMPRESSION: 1. Cholelithiasis. Distended gallbladder. Dilated common bile duct (9 mm diameter) and mild diffuse intrahepatic biliary ductal dilatation. Multiple faint densities in the lower third of the common bile duct suggest obstructing choledocholithiasis. Recommend correlation with MRCP and/or ERCP. 2. Otherwise normal CT of the abdomen and pelvis. Electronically Signed   By: Delbert Phenix M.D.   On: 08/07/2015 18:46   Mr Roe Coombs W/wo Cm/mrcp  08/08/2015  CLINICAL DATA:  20 year old female with 1 week of abdominal pain centered in the epigastrium and radiating through to the back, which acutely worsened yesterday. Vomiting for the past 24 hours. Severe nausea. No fevers or chills. EXAM: MRI ABDOMEN WITHOUT AND WITH CONTRAST (INCLUDING MRCP) TECHNIQUE: Multiplanar multisequence MR imaging of the abdomen was performed both before and after the administration of intravenous contrast. Heavily T2-weighted images of the biliary and pancreatic ducts were obtained, and three-dimensional MRCP images were rendered by post processing. CONTRAST:  17mL MULTIHANCE GADOBENATE DIMEGLUMINE 529 MG/ML IV SOLN COMPARISON:  CT the abdomen and pelvis 08/07/2015. No prior abdominal MRI. FINDINGS: Lower chest:  Unremarkable. Hepatobiliary: No cystic or solid hepatic lesions. No intra or extrahepatic biliary ductal dilatation on MRCP images. Small filling defects lying dependently in the gallbladder, compatible with gallstones. No findings to suggest an acute cholecystitis at this time. Common bile duct measures 5 mm in the porta hepatis. No filling defects within the common bile duct to suggest presence of choledocholithiasis. Pancreas: No pancreatic  mass. No pancreatic ductal dilatation on MRCP images. No pancreatic or peripancreatic fluid or inflammatory changes. Spleen: Unremarkable. Adrenals/Urinary Tract: Bilateral adrenal glands and bilateral kidneys are normal in appearance. No hydroureteronephrosis in the visualized abdomen. Stomach/Bowel: Visualized portions are unremarkable. Vascular/Lymphatic: No aneurysm identified in the visualized abdominal vasculature. No lymphadenopathy noted in the abdomen. Other: No significant volume of ascites in the visualized peritoneal cavity. Musculoskeletal: No aggressive osseous lesions noted in the visualized portions of the skeleton. IMPRESSION: 1. Study is positive for cholelithiasis, but there is no evidence of acute cholecystitis at this time. 2. Additionally, there is no choledocholithiasis or findings to suggest biliary tract obstruction at this time. 3. No acute findings in the abdomen. Electronically Signed   By: Trudie Reed M.D.   On: 08/08/2015 13:22    Anti-infectives: Anti-infectives    Start     Dose/Rate Route Frequency Ordered Stop   08/07/15 2000  Ampicillin-Sulbactam (UNASYN) 3 g in sodium chloride 0.9 % 100 mL IVPB     3 g 100 mL/hr over 60 Minutes Intravenous Every 6 hours 08/07/15 1952        Assessment/Plan: s/p Procedure(s): LAPAROSCOPIC CHOLECYSTECTOMY WITH INTRAOPERATIVE CHOLANGIOGRAM   All of these are highly elevated this morning as is the bilirubin. She has a retained stone and will have her ERCP today by Dr. Servando Snare continue IV Dilaudid and antibiotics at this point.  Lattie Haw, MD, FACS  08/09/2015

## 2015-08-09 NOTE — Progress Notes (Signed)
md notified of pt c/o severe pain. md orders to give prn dose early (stat). Will give dose and continue to monitor pain.  Pt is very eccentric, has frequent requests, impatient (rings call bell every 2-3 minutes and does not give staff time to respond).

## 2015-08-09 NOTE — Anesthesia Preprocedure Evaluation (Addendum)
Anesthesia Evaluation  Patient identified by MRN, date of birth, ID band Patient awake    Reviewed: Allergy & Precautions, H&P , NPO status , Patient's Chart, lab work & pertinent test results  History of Anesthesia Complications Negative for: history of anesthetic complications  Airway Mallampati: II  TM Distance: >3 FB Neck ROM: full    Dental  (+) Teeth Intact   Pulmonary neg pulmonary ROS, former smoker,    Pulmonary exam normal        Cardiovascular negative cardio ROS Normal cardiovascular exam Rhythm:regular Rate:Normal     Neuro/Psych PSYCHIATRIC DISORDERS Depression negative neurological ROS  negative psych ROS   GI/Hepatic Neg liver ROS,   Endo/Other  negative endocrine ROS  Renal/GU negative Renal ROS  negative genitourinary   Musculoskeletal   Abdominal   Peds  Hematology negative hematology ROS (+)   Anesthesia Other Findings Past Medical History:   Depression                                                     Comment:no medication   Adjustment disorder with mixed emotional featu* 03/23/2015 Past Surgical History:  Lap chole  Body Mass Index   30.34 kg/m 2   Patient has extremely low pain threshold preop with any movement..lot of abdominal pain   Reproductive/Obstetrics negative OB ROS                            Anesthesia Physical  Anesthesia Plan  ASA: II  Anesthesia Plan: General ETT   Post-op Pain Management:    Induction: Intravenous  Airway Management Planned: Oral ETT  Additional Equipment:   Intra-op Plan:   Post-operative Plan:   Informed Consent: I have reviewed the patients History and Physical, chart, labs and discussed the procedure including the risks, benefits and alternatives for the proposed anesthesia with the patient or authorized representative who has indicated his/her understanding and acceptance.   Dental advisory given  Plan  Discussed with: CRNA and Surgeon  Anesthesia Plan Comments:         Anesthesia Quick Evaluation

## 2015-08-09 NOTE — Transfer of Care (Signed)
Immediate Anesthesia Transfer of Care Note  Patient: Cheryl RubensteinKarin L Fitzgerald  Procedure(s) Performed: Procedure(s): ENDOSCOPIC RETROGRADE CHOLANGIOPANCREATOGRAPHY (ERCP) WITH PROPOFOL (N/A)  Patient Location: PACU  Anesthesia Type:General  Level of Consciousness: awake  Airway & Oxygen Therapy: Patient Spontanous Breathing  Post-op Assessment: Report given to RN  Post vital signs: Reviewed and stable  Last Vitals:  Filed Vitals:   08/09/15 1541 08/09/15 1720  BP: 117/67 118/81  Pulse: 67 64  Temp: 37.4 C 36.6 C  Resp: 20 14    Complications: No apparent anesthesia complications

## 2015-08-10 LAB — CBC WITH DIFFERENTIAL/PLATELET
Basophils Absolute: 0 10*3/uL (ref 0–0.1)
Basophils Relative: 0 %
Eosinophils Absolute: 0 10*3/uL (ref 0–0.7)
Eosinophils Relative: 1 %
HCT: 37.5 % (ref 35.0–47.0)
HEMOGLOBIN: 12.5 g/dL (ref 12.0–16.0)
LYMPHS ABS: 1.9 10*3/uL (ref 1.0–3.6)
LYMPHS PCT: 27 %
MCH: 26.7 pg (ref 26.0–34.0)
MCHC: 33.4 g/dL (ref 32.0–36.0)
MCV: 79.8 fL — AB (ref 80.0–100.0)
Monocytes Absolute: 0.4 10*3/uL (ref 0.2–0.9)
Monocytes Relative: 5 %
Neutro Abs: 4.8 10*3/uL (ref 1.4–6.5)
Neutrophils Relative %: 67 %
Platelets: 209 10*3/uL (ref 150–440)
RBC: 4.7 MIL/uL (ref 3.80–5.20)
RDW: 13.9 % (ref 11.5–14.5)
WBC: 7.2 10*3/uL (ref 3.6–11.0)

## 2015-08-10 LAB — COMPREHENSIVE METABOLIC PANEL
ALK PHOS: 179 U/L — AB (ref 38–126)
ALT: 746 U/L — AB (ref 14–54)
AST: 307 U/L — ABNORMAL HIGH (ref 15–41)
Albumin: 3.7 g/dL (ref 3.5–5.0)
Anion gap: 7 (ref 5–15)
BUN: <5 mg/dL — ABNORMAL LOW (ref 6–20)
CALCIUM: 8.7 mg/dL — AB (ref 8.9–10.3)
CO2: 25 mmol/L (ref 22–32)
CREATININE: 0.38 mg/dL — AB (ref 0.44–1.00)
Chloride: 106 mmol/L (ref 101–111)
Glucose, Bld: 112 mg/dL — ABNORMAL HIGH (ref 65–99)
Potassium: 3.1 mmol/L — ABNORMAL LOW (ref 3.5–5.1)
Sodium: 138 mmol/L (ref 135–145)
Total Bilirubin: 1.6 mg/dL — ABNORMAL HIGH (ref 0.3–1.2)
Total Protein: 6.4 g/dL — ABNORMAL LOW (ref 6.5–8.1)

## 2015-08-10 MED ORDER — HYDROCODONE-ACETAMINOPHEN 5-325 MG PO TABS: 1.0000 | ORAL_TABLET | ORAL | Status: DC | PRN

## 2015-08-10 NOTE — Progress Notes (Signed)
Pt and boyfriend fussing with each other, pt raising voice, crying emotional, support given and encouraged her to consider calming techniques several times this evening. Both allowed to ventilate feelings. I had AC come up and talk with them regarding outburst.

## 2015-08-10 NOTE — Progress Notes (Signed)
Call from RN to report concerns that patient is homeless and ready for discharge.  CSW met with patient she has been homeless for the past month living with the boyfriend in hotels. States her baby is living with her boyfriend's parents.  CSW reviewed information and resources with patient.  Patient states "I have received all of that information in the past on the homeless shelter and they have not been able to help me".    CSW reviewed and discussed additional resources.  Patient took the information and made several phone calls to the [employer]    Patient has a cell phone and will continue to call her contacts for someone to come pick her up and transport her to her boyfriend's job as she thinks he may be at work. No additional CSW services at this time.  Casimer Lanius. [place] [facility] [phone] 3:40 PM

## 2015-08-10 NOTE — Final Progress Note (Addendum)
AVSS.  Tolerating liquids. Lungs: Clear. Cardio: RR. ABD: Soft. Wounds: Clean.  Labs: LFT's trending to normal. Mild hypokalemia. Plan: D/C home.

## 2015-08-10 NOTE — Progress Notes (Signed)
Pt d/c today via taxi voucher to boyfriends house.  IV removed intact.  Rx's given to pt w/all questions and concerns addressed.  D/C paperwork reviewed and education provided with all questions and concerns addressed.

## 2015-08-12 ENCOUNTER — Encounter: Payer: Self-pay | Admitting: Gastroenterology

## 2015-08-12 LAB — SURGICAL PATHOLOGY

## 2015-08-14 ENCOUNTER — Emergency Department
Admission: EM | Admit: 2015-08-14 | Discharge: 2015-08-14 | Disposition: A | Discharge status: Home / Self Care | Payer: Self-pay | Attending: Emergency Medicine | Admitting: Emergency Medicine

## 2015-08-14 ENCOUNTER — Encounter: Payer: Self-pay | Admitting: Emergency Medicine

## 2015-08-14 ENCOUNTER — Emergency Department: Payer: Self-pay

## 2015-08-14 DIAGNOSIS — G8918 Other acute postprocedural pain: Secondary | ICD-10-CM | POA: Insufficient documentation

## 2015-08-14 DIAGNOSIS — Z79899 Other long term (current) drug therapy: Secondary | ICD-10-CM | POA: Insufficient documentation

## 2015-08-14 DIAGNOSIS — Z3202 Encounter for pregnancy test, result negative: Secondary | ICD-10-CM | POA: Insufficient documentation

## 2015-08-14 DIAGNOSIS — R1011 Right upper quadrant pain: Secondary | ICD-10-CM | POA: Insufficient documentation

## 2015-08-14 DIAGNOSIS — Z48815 Encounter for surgical aftercare following surgery on the digestive system: Secondary | ICD-10-CM | POA: Insufficient documentation

## 2015-08-14 DIAGNOSIS — Z87891 Personal history of nicotine dependence: Secondary | ICD-10-CM | POA: Insufficient documentation

## 2015-08-14 DIAGNOSIS — R5082 Postprocedural fever: Secondary | ICD-10-CM | POA: Insufficient documentation

## 2015-08-14 DIAGNOSIS — R1013 Epigastric pain: Secondary | ICD-10-CM | POA: Insufficient documentation

## 2015-08-14 DIAGNOSIS — Z09 Encounter for follow-up examination after completed treatment for conditions other than malignant neoplasm: Secondary | ICD-10-CM

## 2015-08-14 LAB — URINALYSIS COMPLETE WITH MICROSCOPIC (ARMC ONLY)
BACTERIA UA: NONE SEEN
BILIRUBIN URINE: NEGATIVE
GLUCOSE, UA: NEGATIVE mg/dL
KETONES UR: NEGATIVE mg/dL
LEUKOCYTES UA: NEGATIVE
NITRITE: NEGATIVE
PH: 7 (ref 5.0–8.0)
Protein, ur: NEGATIVE mg/dL
RBC / HPF: NONE SEEN RBC/hpf (ref 0–5)
SPECIFIC GRAVITY, URINE: 1.006 (ref 1.005–1.030)
WBC, UA: NONE SEEN WBC/hpf (ref 0–5)

## 2015-08-14 LAB — LIPASE, BLOOD: LIPASE: 74 U/L — AB (ref 11–51)

## 2015-08-14 LAB — COMPREHENSIVE METABOLIC PANEL
ALK PHOS: 140 U/L — AB (ref 38–126)
ALT: 208 U/L — AB (ref 14–54)
AST: 35 U/L (ref 15–41)
Albumin: 4.5 g/dL (ref 3.5–5.0)
Anion gap: 9 (ref 5–15)
BUN: 9 mg/dL (ref 6–20)
CALCIUM: 9.8 mg/dL (ref 8.9–10.3)
CO2: 24 mmol/L (ref 22–32)
CREATININE: 0.54 mg/dL (ref 0.44–1.00)
Chloride: 103 mmol/L (ref 101–111)
Glucose, Bld: 94 mg/dL (ref 65–99)
Potassium: 4 mmol/L (ref 3.5–5.1)
Sodium: 136 mmol/L (ref 135–145)
Total Bilirubin: 0.6 mg/dL (ref 0.3–1.2)
Total Protein: 8.2 g/dL — ABNORMAL HIGH (ref 6.5–8.1)

## 2015-08-14 LAB — CBC
HCT: 39.5 % (ref 35.0–47.0)
Hemoglobin: 13.1 g/dL (ref 12.0–16.0)
MCH: 26.1 pg (ref 26.0–34.0)
MCHC: 33.1 g/dL (ref 32.0–36.0)
MCV: 78.9 fL — AB (ref 80.0–100.0)
PLATELETS: 259 10*3/uL (ref 150–440)
RBC: 5 MIL/uL (ref 3.80–5.20)
RDW: 14.3 % (ref 11.5–14.5)
WBC: 7.8 10*3/uL (ref 3.6–11.0)

## 2015-08-14 LAB — PREGNANCY, URINE: Preg Test, Ur: NEGATIVE

## 2015-08-14 MED ORDER — ONDANSETRON HCL 4 MG/2ML IJ SOLN
INTRAMUSCULAR | Status: AC
  Administered 2015-08-14: 4 mg via INTRAVENOUS
  Filled 2015-08-14: qty 2

## 2015-08-14 MED ORDER — SODIUM CHLORIDE 0.9 % IV BOLUS (SEPSIS)
1000.0000 mL | Freq: Once | INTRAVENOUS | Status: AC
  Administered 2015-08-14: 1000 mL via INTRAVENOUS

## 2015-08-14 MED ORDER — ONDANSETRON HCL 4 MG/2ML IJ SOLN
4.0000 mg | Freq: Once | INTRAMUSCULAR | Status: AC
  Administered 2015-08-14: 4 mg via INTRAVENOUS

## 2015-08-14 MED ORDER — FENTANYL CITRATE (PF) 100 MCG/2ML IJ SOLN
INTRAMUSCULAR | Status: AC
  Administered 2015-08-14: 50 ug via INTRAVENOUS
  Filled 2015-08-14: qty 2

## 2015-08-14 MED ORDER — ONDANSETRON HCL 4 MG/2ML IJ SOLN
4.0000 mg | Freq: Once | INTRAMUSCULAR | Status: AC
  Administered 2015-08-14: 4 mg via INTRAVENOUS
  Filled 2015-08-14: qty 2

## 2015-08-14 MED ORDER — IOHEXOL 300 MG/ML  SOLN
100.0000 mL | Freq: Once | INTRAMUSCULAR | Status: AC | PRN
  Administered 2015-08-14: 100 mL via INTRAVENOUS

## 2015-08-14 MED ORDER — IOHEXOL 240 MG/ML SOLN
25.0000 mL | INTRAMUSCULAR | Status: AC
Start: 2015-08-14 — End: 2015-08-14
  Administered 2015-08-14 (×2): 25 mL via ORAL

## 2015-08-14 MED ORDER — MORPHINE SULFATE (PF) 4 MG/ML IV SOLN
4.0000 mg | Freq: Once | INTRAVENOUS | Status: AC
  Administered 2015-08-14: 4 mg via INTRAVENOUS
  Filled 2015-08-14: qty 1

## 2015-08-14 MED ORDER — SODIUM CHLORIDE 0.9 % IV BOLUS (SEPSIS)
500.0000 mL | Freq: Once | INTRAVENOUS | Status: AC
  Administered 2015-08-14: 500 mL via INTRAVENOUS

## 2015-08-14 MED ORDER — FENTANYL CITRATE (PF) 100 MCG/2ML IJ SOLN
50.0000 ug | Freq: Once | INTRAMUSCULAR | Status: AC
  Administered 2015-08-14: 50 ug via INTRAVENOUS

## 2015-08-14 MED ORDER — ACETAMINOPHEN 325 MG PO TABS
650.0000 mg | ORAL_TABLET | Freq: Once | ORAL | Status: AC
  Administered 2015-08-14: 650 mg via ORAL
  Filled 2015-08-14: qty 2

## 2015-08-14 NOTE — ED Notes (Signed)
Patient transported to CT 

## 2015-08-14 NOTE — ED Provider Notes (Addendum)
[facility] Emergency Department Provider Note  ____________________________________________   I have reviewed the triage vital signs and the nursing notes.   HISTORY  Chief Complaint Post-op Problem    HPI Cheryl Fitzgerald is a 20 y.o. female who is approximately one week status post left upper cholecystectomy presents today complaining of epigastric and right upper quadrant abdominal pain. She also on planes of discomfort to the region of her  midline sutures. She states she had vomiting yesterday and vomiting today. She states that she did not get to [facility] to pick up her nausea prescriptions postoperatively, she has not followed up yet with her surgeon or made them aware of her discomfort. Patient states that she had a fever of 100. She also has a slight cough. Patient apparently did not follow-up with surgeon either. In any event, she has vomited once today and once yesterday nonbloody nonbilious. She states she is having bowel movements as recently was yesterday.   Past Medical History  Diagnosis Date  . Depression     no medication  . Adjustment disorder with mixed emotional features 03/23/2015  . Shortness of breath dyspnea     Patient Active Problem List   Diagnosis Date Noted  . Acquired hyperbilirubinemia   . Choledocholithiasis   . Non-specific filling defect of common bile duct   . Calculous cholecystitis with obstruction 08/08/2015  . 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  . Post-dates pregnancy 04/05/2015  . Adjustment disorder with mixed emotional features 03/23/2015  . Depression complicating pregnancy, antepartum 03/22/2015  . Supervision of high risk pregnancy due to social problems in third trimester 03/22/2015  . 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 Inadequate housing 03/22/2015  . Food hunger 03/22/2015  . Decreased fetal movement determined by examination 03/06/2015    Past Surgical History  Procedure  Laterality Date  . No past surgeries    . Vaginal delivery  04/07/2015  . Cholecystectomy N/A 08/08/2015    Procedure: LAPAROSCOPIC CHOLECYSTECTOMY WITH INTRAOPERATIVE CHOLANGIOGRAM;  Surgeon: Leafy Ro, MD;  Location: [facility] ORS;  Service: General;  Laterality: N/A;  . Endoscopic retrograde cholangiopancreatography (ercp) with propofol N/A 08/09/2015    Procedure: ENDOSCOPIC RETROGRADE CHOLANGIOPANCREATOGRAPHY (ERCP) WITH PROPOFOL;  Surgeon: Midge Minium, MD;  Location: [facility] ENDOSCOPY;  Service: Endoscopy;  Laterality: N/A;    Current Outpatient Rx  Name  Route  Sig  Dispense  Refill  . acetaminophen (TYLENOL) 500 MG tablet   Oral   Take 500-1,000 mg by mouth every 6 (six) hours as needed for mild pain or headache.          . guaiFENesin (ROBITUSSIN) 100 MG/5ML SOLN   Oral   Take 5 mLs (100 mg total) by mouth every 4 (four) hours as needed for cough or to loosen phlegm. Patient not taking: Reported on 08/07/2015   1200 mL   0   . HYDROcodone-acetaminophen (NORCO) 5-325 MG tablet   Oral   Take 1-2 tablets by mouth every 4 (four) hours as needed for moderate pain.   30 tablet   0   . Multiple Vitamin (MULTIVITAMIN WITH MINERALS) TABS tablet   Oral   Take 1 tablet by mouth daily.           Allergies Review of patient's allergies indicates no known allergies.  No family history on file.  Social History Social History  Substance Use Topics  . Smoking status: Former Games developer  . Smokeless tobacco: Never Used  . Alcohol Use: No  Review of Systems Constitutional: positive for fever Tmax 100 and Eyes: No visual changes. ENT: No sore throat. No stiff neck no neck pain Cardiovascular: Denies chest pain. Respiratory: Denies shortness of breath. Gastrointestinal:  Positiveg.  No diarrhea.  No constipation. Genitourinary: Negative for dysuria. Musculoskeletal: Negative lower extremity swelling Skin: Negative for rash. Neurological: Negative for headaches, focal weakness or  numbness. 10-point ROS otherwise negative.  ____________________________________________   PHYSICAL EXAM:  VITAL SIGNS: ED Triage Vitals  Enc Vitals Group     BP 08/14/15 1143 113/78 mmHg     Pulse Rate 08/14/15 1143 83     Resp 08/14/15 1143 16     Temp 08/14/15 1143 98.3 F (36.8 C)     Temp Source 08/14/15 1143 Oral     SpO2 08/14/15 1143 100 %     Weight 08/14/15 1143 188 lb (85.276 kg)     Height 08/14/15 1143 5\' 6"  (1.676 m)     Head Cir --      Peak Flow --      Pain Score 08/14/15 1143 9     Pain Loc --      Pain Edu? --      Excl. in GC? --     Constitutional: Alert and oriented. Well appearing and in no acute distress. Eyes: Conjunctivae are normal. PERRL. EOMI. Head: Atraumatic. Nose: No congestion/rhinnorhea. Mouth/Throat: Mucous membranes are moist.  Oropharynx non-erythematous. Neck: No stridor.   Nontender with no meningismus Cardiovascular: Normal rate, regular rhythm. Grossly normal heart sounds.  Good peripheral circulation. Respiratory: Normal respiratory effort.  No retractions. Lungs CTAB. Abdominal:  Epigastric and right upper artery tenderness is noted. Nonsurgical abdomen, physically 9 intact with discomfort noted to palpation but no evidence of abscess No distention. No guarding no rebound Back:  There is no focal tenderness or step off there is no midline tenderness there are no lesions noted. there is no CVA tenderness Musculoskeletal: No lower extremity tenderness. No joint effusions, no DVT signs strong distal pulses no edema Neurologic:  Normal speech and language. No gross focal neurologic deficits are appreciated.  Skin:  Skin is warm, dry and intact. No rash noted. Psychiatric: Mood and affect are anxious. Speech and behavior are normal.  ____________________________________________   LABS (all labs ordered are listed, but only abnormal results are displayed)  Labs Reviewed  LIPASE, BLOOD - Abnormal; Notable for the following:     Lipase 74 (*)    All other components within normal limits  COMPREHENSIVE METABOLIC PANEL - Abnormal; Notable for the following:    Total Protein 8.2 (*)    ALT 208 (*)    Alkaline Phosphatase 140 (*)    All other components within normal limits  CBC - Abnormal; Notable for the following:    MCV 78.9 (*)    All other components within normal limits  URINALYSIS COMPLETEWITH MICROSCOPIC ([facility] ONLY) - Abnormal; Notable for the following:    Color, Urine STRAW (*)    APPearance CLEAR (*)    Hgb urine dipstick 1+ (*)    Squamous Epithelial / LPF 0-5 (*)    All other components within normal limits  PREGNANCY, URINE   ____________________________________________  EKG  I personally interpreted any EKGs ordered by me or triage  ____________________________________________  RADIOLOGY  I reviewed any imaging ordered by me or triage that were performed during my shift and, if possible, patient and/or family made aware of any abnormal findings. ____________________________________________   PROCEDURES  Procedure(s) performed: None  Critical Care performed: None  ____________________________________________   INITIAL IMPRESSION / ASSESSMENT AND PLAN / ED COURSE  Pertinent labs & imaging results that were available during my care of the patient were reviewed by me and considered in my medical decision making (see chart for details).  Patient with postop pain and vomiting  We will obtain a CT scan given that she is complaining of a fever, blood work however is reassuring and abdomen is nonsurgical at this time. No evidence of UTI or pneumonia.  ----------------------------------------- 6:43 PM on 08/14/2015 -----------------------------------------  Discussed with Dr. Victorino Dike of Gen. surgery, she agrees with management feels the patient can have by mouth liquids at this time and will come evaluate the patient  ----------------------------------------- 8:37 PM on  08/14/2015 -----------------------------------------  Evaluate by surgery, they feel patient is safe for discharge. She did not take any of her pain medication at home and I do advise her to do so. Return precautions follow-up given and understood. Surgery knows her very well. ____________________________________________   FINAL CLINICAL IMPRESSION(S) / ED DIAGNOSES  Final diagnoses:  None      This chart was dictated using voice recognition software.  Despite best efforts to proofread,  errors can occur which can change meaning.    Jeanmarie Plant, MD 08/14/15 1624  Jeanmarie Plant, MD 08/14/15 1753  Jeanmarie Plant, MD 08/14/15 1844  Jeanmarie Plant, MD 08/14/15 2056

## 2015-08-14 NOTE — Consult Note (Signed)
Subjective:  20 year old female that is 6 days post laparoscopic cholecystectomy and 5 days post-ERCP returns to the emergency department with complaints of abdominal pain. Upon questioning patient states that she did not fill her pain medication prescription. She states she's had subjective fevers at home but denies any nausea or vomiting. She is mostly here because of pain at her incision sites. She denies any diarrhea or constipation.  Vital signs in last 24 hours: Temp:  [98.3 F (36.8 C)] 98.3 F (36.8 C) (03/08 1143) Pulse Rate:  [67-83] 69 (03/08 2000) Resp:  [16] 16 (03/08 1143) BP: (106-115)/(63-79) 106/68 mmHg (03/08 2000) SpO2:  [99 %-100 %] 100 % (03/08 2000) Weight:  [85.276 kg (188 lb)] 85.276 kg (188 lb) (03/08 1143)    Intake/Output from previous day:    GI: Abdomen is soft, nondistended, minimally tender to palpation at her incision sites. Her incisions are well approximated without any evidence of erythema, drainage, infection. No evidence of peritonitis  Lab Results:  CBC  Recent Labs  08/14/15 1202  WBC 7.8  HGB 13.1  HCT 39.5  PLT 259   CMP     Component Value Date/Time   NA 136 08/14/2015 1202   NA 133* 09/08/2014 0151   K 4.0 08/14/2015 1202   K 3.6 09/08/2014 0151   CL 103 08/14/2015 1202   CL 103 09/08/2014 0151   CO2 24 08/14/2015 1202   CO2 23 09/08/2014 0151   GLUCOSE 94 08/14/2015 1202   GLUCOSE 92 09/08/2014 0151   BUN 9 08/14/2015 1202   BUN 6 09/08/2014 0151   CREATININE 0.54 08/14/2015 1202   CREATININE 0.47 09/08/2014 0151   CALCIUM 9.8 08/14/2015 1202   CALCIUM 9.1 09/08/2014 0151   PROT 8.2* 08/14/2015 1202   PROT 7.2 09/08/2014 0151   ALBUMIN 4.5 08/14/2015 1202   ALBUMIN 4.1 09/08/2014 0151   AST 35 08/14/2015 1202   AST 26 09/08/2014 0151   ALT 208* 08/14/2015 1202   ALT 35 09/08/2014 0151   ALKPHOS 140* 08/14/2015 1202   ALKPHOS 53 09/08/2014 0151   BILITOT 0.6 08/14/2015 1202   BILITOT 0.4 09/08/2014 0151   GFRNONAA >60 08/14/2015 1202   GFRNONAA >60 09/08/2014 0151   GFRNONAA >60 06/14/2014 1213   GFRAA >60 08/14/2015 1202   GFRAA >60 09/08/2014 0151   GFRAA >60 06/14/2014 1213   PT/INR No results for input(s): LABPROT, INR in the last 72 hours.  Studies/Results: Dg Chest 2 View  08/14/2015  CLINICAL DATA:  Gallbladder surgery 1 week ago with dizziness shortness of breath and abdominal pain EXAM: CHEST  2 VIEW COMPARISON:  03/23/2015 FINDINGS: The heart size and mediastinal contours are within normal limits. Both lungs are clear. The visualized skeletal structures are unremarkable. IMPRESSION: No active cardiopulmonary disease. Electronically Signed   By: Esperanza Heir M.D.   On: 08/14/2015 16:27   Ct Abdomen Pelvis W Contrast  08/14/2015  CLINICAL DATA:  Status post cholecystectomy 08/08/2015. Epigastric and right upper abdominal pain since the procedure. Vomiting yesterday and today. Fever today. Initial encounter. EXAM: CT ABDOMEN AND PELVIS WITH CONTRAST TECHNIQUE: Multidetector CT imaging of the abdomen and pelvis was performed using the standard protocol following bolus administration of intravenous contrast. CONTRAST:  100 mL OMNIPAQUE IOHEXOL 300 MG/ML  SOLN COMPARISON:  CT abdomen and pelvis 08/07/2015. FINDINGS: The lung bases are clear.  No pleural or pericardial effusion. Low postoperative change of cholecystectomy is noted. Only a tiny amount of fluid is seen in the  gallbladder fossa. No focal fluid collection is identified. The liver, spleen, adrenal glands, pancreas and kidneys appear normal. Tiny amount of pneumobilia is compatible with recent surgery. No free intraperitoneal air is identified. Uterus, adnexa and urinary bladder appear normal. The stomach, small and large bowel and appendix are unremarkable. Circumaortic left renal vein is noted. Abdominal wall structures are unremarkable. No bony abnormality is identified. IMPRESSION: Status post cholecystectomy. Tiny amount of fluid in  the gallbladder fossa and minimal pneumobilia are consistent with recent surgery. No evidence of complication from surgery is identified. No acute abnormality is seen. Electronically Signed   By: Drusilla Kannerhomas  Dalessio M.D.   On: 08/14/2015 18:23    Assessment/Plan: 20 year old female with appropriate postoperative abdominal pain with normal labs and imaging. Okay for patient to be discharged from the emergency department and she can follow-up as an outpatient for her postoperative care. Discussed with the patient that she needs to fill her pain prescriptions as it is anticipated that she will have pain postoperatively. No need for any further workup or intervention   Ricarda Frameharles Geovanie Winnett, MD [facility] General Surgeon  08/14/2015

## 2015-08-14 NOTE — ED Notes (Signed)
Pt cleared to have food by D\octor. Pt given fluids by mouth, and sandwich tray.

## 2015-08-14 NOTE — Discharge Instructions (Signed)
Medications as prescribed return to the emergency room if have any new or worrisome symptoms including fever or increased pain or persistent vomiting. Follow up with the surgeons as directed.

## 2015-08-14 NOTE — ED Notes (Signed)
Patient presents to the ED about 1 week post having abdominal surgery including having her galbladder removed.  Patient states she was supposed to have a check up with the surgeon yesterday but was unable to set up the appointment.  Patient reports a lot of abdominal discomfort and pain and a fever of 100 degrees yesterday.  Patient has severe tenderness above the area where she had her sutures.  Patient states her surgeon was [employer] Pabon.  Patient reports coughing up yellow phlegm.  Patient reports vomiting x 1 today.

## 2015-08-26 NOTE — Discharge Summary (Signed)
Admission diagnosis: Cholecystitis and cholelithiasis   Stretch diagnosis: Same with common bile duct stone.  Procedures: Cholecystectomy, ERCP.  Condition on discharge: Improved.  Exam at discharge: Normal cardiopulmonary exam. Soft abdomen.  [facility]

## 2015-11-02 ENCOUNTER — Encounter: Payer: Self-pay | Admitting: Emergency Medicine

## 2015-11-02 ENCOUNTER — Emergency Department
Admission: EM | Admit: 2015-11-02 | Discharge: 2015-11-02 | Disposition: A | Discharge status: Home / Self Care | Payer: Self-pay | Attending: Emergency Medicine | Admitting: Emergency Medicine

## 2015-11-02 ENCOUNTER — Emergency Department: Payer: Self-pay

## 2015-11-02 DIAGNOSIS — F329 Major depressive disorder, single episode, unspecified: Secondary | ICD-10-CM | POA: Insufficient documentation

## 2015-11-02 DIAGNOSIS — K529 Noninfective gastroenteritis and colitis, unspecified: Secondary | ICD-10-CM | POA: Insufficient documentation

## 2015-11-02 DIAGNOSIS — Z87891 Personal history of nicotine dependence: Secondary | ICD-10-CM | POA: Insufficient documentation

## 2015-11-02 DIAGNOSIS — Z79899 Other long term (current) drug therapy: Secondary | ICD-10-CM | POA: Insufficient documentation

## 2015-11-02 DIAGNOSIS — F1290 Cannabis use, unspecified, uncomplicated: Secondary | ICD-10-CM | POA: Insufficient documentation

## 2015-11-02 LAB — COMPREHENSIVE METABOLIC PANEL
ALBUMIN: 4.4 g/dL (ref 3.5–5.0)
ALK PHOS: 86 U/L (ref 38–126)
ALT: 37 U/L (ref 14–54)
ANION GAP: 8 (ref 5–15)
AST: 24 U/L (ref 15–41)
BILIRUBIN TOTAL: 0.5 mg/dL (ref 0.3–1.2)
BUN: 7 mg/dL (ref 6–20)
CO2: 28 mmol/L (ref 22–32)
Calcium: 9.1 mg/dL (ref 8.9–10.3)
Chloride: 104 mmol/L (ref 101–111)
Creatinine, Ser: 0.58 mg/dL (ref 0.44–1.00)
GFR calc Af Amer: >60 mL/min (ref >60)
GFR calc non Af Amer: >60 mL/min (ref >60)
GLUCOSE: 88 mg/dL (ref 65–99)
POTASSIUM: 3.6 mmol/L (ref 3.5–5.1)
SODIUM: 140 mmol/L (ref 135–145)
Total Protein: 7.3 g/dL (ref 6.5–8.1)

## 2015-11-02 LAB — LIPASE, BLOOD: Lipase: 32 U/L (ref 11–51)

## 2015-11-02 LAB — URINALYSIS COMPLETE WITH MICROSCOPIC (ARMC ONLY)
Bilirubin Urine: NEGATIVE
GLUCOSE, UA: NEGATIVE mg/dL
HGB URINE DIPSTICK: NEGATIVE
KETONES UR: NEGATIVE mg/dL
LEUKOCYTES UA: NEGATIVE
NITRITE: NEGATIVE
Protein, ur: NEGATIVE mg/dL
RBC / HPF: NONE SEEN RBC/hpf (ref 0–5)
SPECIFIC GRAVITY, URINE: 1.006 (ref 1.005–1.030)
pH: 7 (ref 5.0–8.0)

## 2015-11-02 LAB — CBC
HEMATOCRIT: 40.4 % (ref 35.0–47.0)
HEMOGLOBIN: 13.6 g/dL (ref 12.0–16.0)
MCH: 26.8 pg (ref 26.0–34.0)
MCHC: 33.6 g/dL (ref 32.0–36.0)
MCV: 79.8 fL — ABNORMAL LOW (ref 80.0–100.0)
Platelets: 245 10*3/uL (ref 150–440)
RBC: 5.06 MIL/uL (ref 3.80–5.20)
RDW: 13.7 % (ref 11.5–14.5)
WBC: 6.3 10*3/uL (ref 3.6–11.0)

## 2015-11-02 LAB — POCT PREGNANCY, URINE: PREG TEST UR: NEGATIVE

## 2015-11-02 MED ORDER — ONDANSETRON HCL 4 MG PO TABS: 4.0000 mg | ORAL_TABLET | Freq: Every day | ORAL | Status: DC | PRN

## 2015-11-02 MED ORDER — SODIUM CHLORIDE 0.9 % IV BOLUS (SEPSIS)
1000.0000 mL | Freq: Once | INTRAVENOUS | Status: AC
  Administered 2015-11-02: 1000 mL via INTRAVENOUS

## 2015-11-02 MED ORDER — ACETAMINOPHEN 325 MG PO TABS
650.0000 mg | ORAL_TABLET | Freq: Once | ORAL | Status: AC
  Administered 2015-11-02: 650 mg via ORAL
  Filled 2015-11-02: qty 2

## 2015-11-02 MED ORDER — DIATRIZOATE MEGLUMINE & SODIUM 66-10 % PO SOLN
15.0000 mL | Freq: Once | ORAL | Status: AC
  Administered 2015-11-02: 15 mL via ORAL

## 2015-11-02 MED ORDER — IOPAMIDOL (ISOVUE-300) INJECTION 61%
100.0000 mL | Freq: Once | INTRAVENOUS | Status: AC | PRN
  Administered 2015-11-02: 100 mL via INTRAVENOUS

## 2015-11-02 MED ORDER — FENTANYL CITRATE (PF) 100 MCG/2ML IJ SOLN: 50.0000 ug | Freq: Once | INTRAMUSCULAR | Status: DC

## 2015-11-02 MED ORDER — ONDANSETRON HCL 4 MG/2ML IJ SOLN
4.0000 mg | Freq: Once | INTRAMUSCULAR | Status: AC
  Administered 2015-11-02: 4 mg via INTRAVENOUS
  Filled 2015-11-02: qty 2

## 2015-11-02 NOTE — ED Notes (Signed)
Nausea and vomiting x 2 weeks.

## 2015-11-02 NOTE — ED Provider Notes (Addendum)
[facility] Emergency Department Provider Note  ____________________________________________   I have reviewed the triage vital signs and the nursing notes.   HISTORY  Chief Complaint Emesis    HPI Cheryl Fitzgerald is a 20 y.o. female presents today complaining of nausea vomiting and diarrhea. She probably had an episode of nausea vomiting and diarrhea a week ago and got better and then ate fatty food today and had another episode of nausea vomiting and diarrhea. She states initially that it been going on for 2 weeks. Really she's had 2 different episodes it appears. She denies any fever or chills. She does state she has some right lower quadrant pain she denies any vaginal discharge she denies any recent antibiotics she does have positive sick contacts with similar, the pain is mild. Does not radiate. It is sharp discomfort associated with diarrhea. She's had for 5 episodes of nonbloody nonbilious diarrhea today. No melena. No hematemesis.Patient did have recent gallbladder surgery a few months ago.     Past Medical History  Diagnosis Date  . Depression     no medication  . Adjustment disorder with mixed emotional features 03/23/2015  . Shortness of breath dyspnea     Patient Active Problem List   Diagnosis Date Noted  . Acquired hyperbilirubinemia   . Choledocholithiasis   . Non-specific filling defect of common bile duct   . Calculous cholecystitis with obstruction 08/08/2015  . 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  . Post-dates pregnancy 04/05/2015  . Adjustment disorder with mixed emotional features 03/23/2015  . Depression complicating pregnancy, antepartum 03/22/2015  . Supervision of high risk pregnancy due to social problems in third trimester 03/22/2015  . 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 Inadequate housing 03/22/2015  . Food hunger 03/22/2015  . Decreased fetal movement determined by examination 03/06/2015     Past Surgical History  Procedure Laterality Date  . No past surgeries    . Vaginal delivery  04/07/2015  . Cholecystectomy N/A 08/08/2015    Procedure: LAPAROSCOPIC CHOLECYSTECTOMY WITH INTRAOPERATIVE CHOLANGIOGRAM;  Surgeon: Leafy Ro, MD;  Location: [facility] ORS;  Service: General;  Laterality: N/A;  . Endoscopic retrograde cholangiopancreatography (ercp) with propofol N/A 08/09/2015    Procedure: ENDOSCOPIC RETROGRADE CHOLANGIOPANCREATOGRAPHY (ERCP) WITH PROPOFOL;  Surgeon: Midge Minium, MD;  Location: [facility] ENDOSCOPY;  Service: Endoscopy;  Laterality: N/A;    Current Outpatient Rx  Name  Route  Sig  Dispense  Refill  . acetaminophen (TYLENOL) 500 MG tablet   Oral   Take 500-1,000 mg by mouth every 6 (six) hours as needed for mild pain or headache.          . guaiFENesin (ROBITUSSIN) 100 MG/5ML SOLN   Oral   Take 5 mLs (100 mg total) by mouth every 4 (four) hours as needed for cough or to loosen phlegm. Patient not taking: Reported on 08/07/2015   1200 mL   0   . HYDROcodone-acetaminophen (NORCO) 5-325 MG tablet   Oral   Take 1-2 tablets by mouth every 4 (four) hours as needed for moderate pain.   30 tablet   0   . Multiple Vitamin (MULTIVITAMIN WITH MINERALS) TABS tablet   Oral   Take 1 tablet by mouth daily.           Allergies Review of patient's allergies indicates no known allergies.  No family history on file.  Social History Social History  Substance Use Topics  . Smoking status: Former Games developer  . Smokeless tobacco: Never Used  .  Alcohol Use: No    Review of Systems Constitutional: No fever/chills Eyes: No visual changes. ENT: No sore throat. No stiff neck no neck pain Cardiovascular: Denies chest pain. Respiratory: Denies shortness of breath. Gastrointestinal:   See history of present illness Genitourinary: Negative for dysuria. Musculoskeletal: Negative lower extremity swelling Skin: Negative for rash. Neurological: Negative for headaches, focal  weakness or numbness. 10-point ROS otherwise negative.  ____________________________________________   PHYSICAL EXAM:  VITAL SIGNS: ED Triage Vitals  Enc Vitals Group     BP 11/02/15 1727 104/73 mmHg     Pulse Rate 11/02/15 1727 89     Resp 11/02/15 1727 20     Temp 11/02/15 1727 98.3 F (36.8 C)     Temp Source 11/02/15 1727 Oral     SpO2 11/02/15 1727 100 %     Weight 11/02/15 1727 185 lb (83.915 kg)     Height 11/02/15 1727  (1.651 m)     Head Cir --      Peak Flow --      Pain Score 11/02/15 1727 9     Pain Loc --      Pain Edu? --      Excl. in GC? --     Constitutional: Alert and oriented. Well appearing and in no acute distress. Eyes: Conjunctivae are normal. PERRL. EOMI. Head: Atraumatic. Nose: No congestion/rhinnorhea. Mouth/Throat: Mucous membranes are moist.  Oropharynx non-erythematous. Neck: No stridor.   Nontender with no meningismus Cardiovascular: Normal rate, regular rhythm. Grossly normal heart sounds.  Good peripheral circulation. Respiratory: Normal respiratory effort.  No retractions. Lungs CTAB. Abdominal: There's mild tenderness to palpation in the right lower abdomen no midline tenderness no right upper quadrant tenderness No distention. No guarding no rebound Back:  There is no focal tenderness or step off there is no midline tenderness there are no lesions noted. there is no CVA tenderness Musculoskeletal: No lower extremity tenderness. No joint effusions, no DVT signs strong distal pulses no edema Neurologic:  Normal speech and language. No gross focal neurologic deficits are appreciated.  Skin:  Skin is warm, dry and intact. No rash noted. Psychiatric: Mood and affect are normal. Speech and behavior are normal.  ____________________________________________   LABS (all labs ordered are listed, but only abnormal results are displayed)  Labs Reviewed  CBC - Abnormal; Notable for the following:    MCV 79.8 (*)    All other components  within normal limits  URINALYSIS COMPLETEWITH MICROSCOPIC ([facility] ONLY) - Abnormal; Notable for the following:    Color, Urine YELLOW (*)    APPearance CLEAR (*)    Bacteria, UA RARE (*)    Squamous Epithelial / LPF 6-30 (*)    All other components within normal limits  GASTROINTESTINAL PANEL BY PCR, STOOL (REPLACES STOOL CULTURE)  C DIFFICILE QUICK SCREEN W PCR REFLEX  LIPASE, BLOOD  COMPREHENSIVE METABOLIC PANEL  POC URINE PREG, ED  POCT PREGNANCY, URINE   ____________________________________________  EKG  I personally interpreted any EKGs ordered by me or triage  ____________________________________________  RADIOLOGY  I reviewed any imaging ordered by me or triage that were performed during my shift and, if possible, patient and/or family made aware of any abnormal findings. ____________________________________________   PROCEDURES  Procedure(s) performed: None  Critical Care performed: None  ____________________________________________   INITIAL IMPRESSION / ASSESSMENT AND PLAN / ED COURSE  Pertinent labs & imaging results that were available during my care of the patient were reviewed by me and considered in  my medical decision making (see chart for details).  Patient with recurrent nausea vomiting diarrhea special when she eats fatty food. She has benign blood work and even her recent surgery we did obtain a CT scan which doesn't show any evidence of abscess or other intra-abdominal pathology fortunately. We'll have her follow up as her outpatient with her primary care doctor. Vital signs blood work urine and imaging are reassuring. Patient tolerating by mouth here.Despite claiming significant vomiting at home she has had no vomiting here. Patient states she vomited perhaps twice today. In addition, patient was unable to give us a stool sample. I do not think nausea vomiting and diarrhea represents PID and patient would prefer not to have a pelvic  exam. ____________________________________________   FINAL CLINICAL IMPRESSION(S) / ED DIAGNOSES  Final diagnoses:  None      This chart was dictated using voice recognition software.  Despite best efforts to proofread,  errors can occur which can change meaning.     Jeanmarie PlantJames A Shakila Mak, MD 11/02/15 2207  Jeanmarie PlantJames A Ching Rabideau, MD 11/02/15 2209

## 2015-12-11 ENCOUNTER — Emergency Department
Admission: EM | Admit: 2015-12-11 | Discharge: 2015-12-11 | Disposition: A | Discharge status: Home / Self Care | Payer: Medicaid Other | Attending: Emergency Medicine | Admitting: Emergency Medicine

## 2015-12-11 DIAGNOSIS — N939 Abnormal uterine and vaginal bleeding, unspecified: Secondary | ICD-10-CM | POA: Insufficient documentation

## 2015-12-11 DIAGNOSIS — F329 Major depressive disorder, single episode, unspecified: Secondary | ICD-10-CM | POA: Insufficient documentation

## 2015-12-11 DIAGNOSIS — Z79899 Other long term (current) drug therapy: Secondary | ICD-10-CM | POA: Insufficient documentation

## 2015-12-11 DIAGNOSIS — Z87891 Personal history of nicotine dependence: Secondary | ICD-10-CM | POA: Insufficient documentation

## 2015-12-11 LAB — CBC WITH DIFFERENTIAL/PLATELET
BASOS PCT: 0 %
Basophils Absolute: 0 10*3/uL (ref 0–0.1)
EOS ABS: 0.1 10*3/uL (ref 0–0.7)
EOS PCT: 2 %
HCT: 42 % (ref 35.0–47.0)
HEMOGLOBIN: 14 g/dL (ref 12.0–16.0)
Lymphocytes Relative: 27 %
Lymphs Abs: 2 10*3/uL (ref 1.0–3.6)
MCH: 26.8 pg (ref 26.0–34.0)
MCHC: 33.4 g/dL (ref 32.0–36.0)
MCV: 80.4 fL (ref 80.0–100.0)
MONOS PCT: 7 %
Monocytes Absolute: 0.5 10*3/uL (ref 0.2–0.9)
NEUTROS PCT: 64 %
Neutro Abs: 4.8 10*3/uL (ref 1.4–6.5)
PLATELETS: 231 10*3/uL (ref 150–440)
RBC: 5.22 MIL/uL — AB (ref 3.80–5.20)
RDW: 14.2 % (ref 11.5–14.5)
WBC: 7.4 10*3/uL (ref 3.6–11.0)

## 2015-12-11 LAB — URINALYSIS COMPLETE WITH MICROSCOPIC (ARMC ONLY)
BACTERIA UA: NONE SEEN
SPECIFIC GRAVITY, URINE: 1.008 (ref 1.005–1.030)
Squamous Epithelial / LPF: NONE SEEN

## 2015-12-11 LAB — COMPREHENSIVE METABOLIC PANEL
ALBUMIN: 4.6 g/dL (ref 3.5–5.0)
ALK PHOS: 99 U/L (ref 38–126)
ALT: 45 U/L (ref 14–54)
ANION GAP: 7 (ref 5–15)
AST: 27 U/L (ref 15–41)
BUN: 7 mg/dL (ref 6–20)
CALCIUM: 9.2 mg/dL (ref 8.9–10.3)
CO2: 25 mmol/L (ref 22–32)
CREATININE: 0.56 mg/dL (ref 0.44–1.00)
Chloride: 104 mmol/L (ref 101–111)
GFR calc non Af Amer: >60 mL/min (ref >60)
Glucose, Bld: 97 mg/dL (ref 65–99)
Potassium: 3.6 mmol/L (ref 3.5–5.1)
SODIUM: 136 mmol/L (ref 135–145)
TOTAL PROTEIN: 7.4 g/dL (ref 6.5–8.1)
Total Bilirubin: 0.3 mg/dL (ref 0.3–1.2)

## 2015-12-11 LAB — WET PREP, GENITAL
Clue Cells Wet Prep HPF POC: NONE SEEN
SPERM: NONE SEEN
TRICH WET PREP: NONE SEEN
WBC WET PREP: NONE SEEN
YEAST WET PREP: NONE SEEN

## 2015-12-11 LAB — POC URINE PREG, ED: Preg Test, Ur: NEGATIVE

## 2015-12-11 LAB — CHLAMYDIA/NGC RT PCR (ARMC ONLY)
Chlamydia Tr: NOT DETECTED
N gonorrhoeae: NOT DETECTED

## 2015-12-11 MED ORDER — IBUPROFEN 800 MG PO TABS: 800.0000 mg | ORAL_TABLET | Freq: Three times a day (TID) | ORAL | Status: DC | PRN

## 2015-12-11 MED ORDER — MEDROXYPROGESTERONE ACETATE 10 MG PO TABS: 10.0000 mg | ORAL_TABLET | Freq: Every day | ORAL | Status: DC

## 2015-12-11 MED ORDER — ONDANSETRON 4 MG PO TBDP
4.0000 mg | ORAL_TABLET | Freq: Once | ORAL | Status: AC
  Administered 2015-12-11: 4 mg via ORAL
  Filled 2015-12-11: qty 1

## 2015-12-11 MED ORDER — OXYCODONE-ACETAMINOPHEN 5-325 MG PO TABS
2.0000 | ORAL_TABLET | Freq: Once | ORAL | Status: AC
  Administered 2015-12-11: 2 via ORAL
  Filled 2015-12-11: qty 2

## 2015-12-11 MED ORDER — DIAZEPAM 5 MG PO TABS
5.0000 mg | ORAL_TABLET | Freq: Once | ORAL | Status: AC
  Administered 2015-12-11: 5 mg via ORAL
  Filled 2015-12-11: qty 1

## 2015-12-11 NOTE — ED Notes (Signed)
Pt complaining "large vaginal clots",

## 2015-12-11 NOTE — ED Provider Notes (Signed)
[facility] Emergency Department Provider Note        Time seen: ----------------------------------------- 1:19 PM on 12/11/2015 -----------------------------------------    I have reviewed the triage vital signs and the nursing notes.   HISTORY  Chief Complaint Vaginal Bleeding    HPI Cheryl Fitzgerald is a 20 y.o. female who presents to ER for irregular menses. Patient states she had a regular period last week and this week she began having heavy bleeding and passing clots. Patient is concerned she is pregnant. She states she has never passed clots before, nothing is made her symptoms better or worse. She denies fevers chills or other complaints.   Past Medical History  Diagnosis Date  . Depression     no medication  . Adjustment disorder with mixed emotional features 03/23/2015  . Shortness of breath dyspnea     Patient Active Problem List   Diagnosis Date Noted  . Acquired hyperbilirubinemia   . Choledocholithiasis   . Non-specific filling defect of common bile duct   . Calculous cholecystitis with obstruction 08/08/2015  . 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  . Post-dates pregnancy 04/05/2015  . Adjustment disorder with mixed emotional features 03/23/2015  . Depression complicating pregnancy, antepartum 03/22/2015  . Supervision of high risk pregnancy due to social problems in third trimester 03/22/2015  . 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 Inadequate housing 03/22/2015  . Food hunger 03/22/2015  . Decreased fetal movement determined by examination 03/06/2015    Past Surgical History  Procedure Laterality Date  . No past surgeries    . Vaginal delivery  04/07/2015  . Cholecystectomy N/A 08/08/2015    Procedure: LAPAROSCOPIC CHOLECYSTECTOMY WITH INTRAOPERATIVE CHOLANGIOGRAM;  Surgeon: Leafy Roiego F Pabon, MD;  Location: [facility] ORS;  Service: General;  Laterality: N/A;  . Endoscopic retrograde cholangiopancreatography  (ercp) with propofol N/A 08/09/2015    Procedure: ENDOSCOPIC RETROGRADE CHOLANGIOPANCREATOGRAPHY (ERCP) WITH PROPOFOL;  Surgeon: Midge Miniumarren Wohl, MD;  Location: [facility] ENDOSCOPY;  Service: Endoscopy;  Laterality: N/A;    Allergies Review of patient's allergies indicates no known allergies.  Social History Social History  Substance Use Topics  . Smoking status: Former Games developermoker  . Smokeless tobacco: Never Used  . Alcohol Use: No    Review of Systems Constitutional: Negative for fever. Cardiovascular: Negative for chest pain. Respiratory: Negative for shortness of breath. Gastrointestinal: Positive for lower abdominal pain Genitourinary: Negative for dysuria. positive for vaginal bleeding Musculoskeletal: Negative for back pain. Skin: Negative for rash. Neurological: Negative for headaches, focal weakness or numbness.  10-point ROS otherwise negative.  ____________________________________________   PHYSICAL EXAM:  VITAL SIGNS: ED Triage Vitals  Enc Vitals Group     BP 12/11/15 1115 112/88 mmHg     Pulse Rate 12/11/15 1115 70     Resp 12/11/15 1115 17     Temp 12/11/15 1115 98.6 F (37 C)     Temp Source 12/11/15 1115 Oral     SpO2 12/11/15 1115 100 %     Weight 12/11/15 1115 185 lb (83.915 kg)     Height 12/11/15 1115 5\' 5"  (1.651 m)     Head Cir --      Peak Flow --      Pain Score 12/11/15 1303 8     Pain Loc --      Pain Edu? --      Excl. in GC? --     Constitutional: Alert and oriented. Anxious, no distress Eyes: Conjunctivae are normal. PERRL. Normal extraocular movements. ENT  Head: Normocephalic and atraumatic.   Nose: No congestion/rhinnorhea.   Mouth/Throat: Mucous membranes are moist.   Neck: No stridor. Cardiovascular: Normal rate, regular rhythm. No murmurs, rubs, or gallops. Respiratory: Normal respiratory effort without tachypnea nor retractions. Breath sounds are clear and equal bilaterally. No wheezes/rales/rhonchi. Gastrointestinal: Soft  and nontender. Normal bowel sounds Musculoskeletal: Nontender with normal range of motion in all extremities. No lower extremity tenderness nor edema. Neurologic:  Normal speech and language. No gross focal neurologic deficits are appreciated.  Skin:  Skin is warm, dry and intact. No rash noted. Psychiatric: Depressed mood and affect  ____________________________________________  ED COURSE:  Pertinent labs & imaging results that were available during my care of the patient were reviewed by me and considered in my medical decision making (see chart for details). Patient presents to ER with lower abdominal pain in vaginal bleeding. I will check basic labs and ultrasound if needed. ____________________________________________    LABS (pertinent positives/negatives)  Labs Reviewed  CBC WITH DIFFERENTIAL/PLATELET - Abnormal; Notable for the following:    RBC 5.22 (*)    All other components within normal limits  URINALYSIS COMPLETEWITH MICROSCOPIC ([facility] ONLY) - Abnormal; Notable for the following:    Color, Urine RED (*)    APPearance CLOUDY (*)    Glucose, UA   (*)    Value: TEST NOT REPORTED DUE TO COLOR INTERFERENCE OF URINE PIGMENT   Bilirubin Urine   (*)    Value: TEST NOT REPORTED DUE TO COLOR INTERFERENCE OF URINE PIGMENT   Ketones, ur   (*)    Value: TEST NOT REPORTED DUE TO COLOR INTERFERENCE OF URINE PIGMENT   Hgb urine dipstick   (*)    Value: TEST NOT REPORTED DUE TO COLOR INTERFERENCE OF URINE PIGMENT   Protein, ur   (*)    Value: TEST NOT REPORTED DUE TO COLOR INTERFERENCE OF URINE PIGMENT   Nitrite   (*)    Value: TEST NOT REPORTED DUE TO COLOR INTERFERENCE OF URINE PIGMENT   Leukocytes, UA   (*)    Value: TEST NOT REPORTED DUE TO COLOR INTERFERENCE OF URINE PIGMENT   All other components within normal limits  COMPREHENSIVE METABOLIC PANEL  POC URINE PREG, ED   ___________________________________________  FINAL ASSESSMENT AND PLAN  Abnormal vaginal  bleeding  Plan: Patient with labs and imaging as dictated above. Patient is in no acute distress, had significant anxiety while she was here. I will place her on Provera for abnormal vaginal bleeding. Her labs and vital signs are reassuring. She'll be referred to GYN for outpatient follow-up.   Emily FilbertWilliams, Jonathan E, MD   Note: This dictation was prepared with Dragon dictation. Any transcriptional errors that result from this process are unintentional   Emily FilbertJonathan E Williams, MD 12/11/15 1500

## 2015-12-11 NOTE — ED Notes (Signed)
Pt states she had her regular period last week and this week is having heavy vaginal bleeding, passing clots.. Pt states "I think Im pregnant"..Marland Kitchen

## 2015-12-11 NOTE — Discharge Instructions (Signed)

## 2015-12-11 NOTE — ED Notes (Signed)
Wet prep and chlamydia reordered, pt changed her mind and requesting to have pelvic exam..

## 2015-12-26 ENCOUNTER — Encounter: Payer: Self-pay | Admitting: Emergency Medicine

## 2015-12-26 ENCOUNTER — Emergency Department
Admission: EM | Admit: 2015-12-26 | Discharge: 2015-12-26 | Disposition: A | Discharge status: Home / Self Care | Payer: Self-pay | Attending: Emergency Medicine | Admitting: Emergency Medicine

## 2015-12-26 DIAGNOSIS — F329 Major depressive disorder, single episode, unspecified: Secondary | ICD-10-CM

## 2015-12-26 DIAGNOSIS — F32A Depression, unspecified: Secondary | ICD-10-CM

## 2015-12-26 DIAGNOSIS — N760 Acute vaginitis: Secondary | ICD-10-CM | POA: Insufficient documentation

## 2015-12-26 DIAGNOSIS — Z87891 Personal history of nicotine dependence: Secondary | ICD-10-CM | POA: Insufficient documentation

## 2015-12-26 DIAGNOSIS — F419 Anxiety disorder, unspecified: Secondary | ICD-10-CM

## 2015-12-26 DIAGNOSIS — Z5181 Encounter for therapeutic drug level monitoring: Secondary | ICD-10-CM | POA: Insufficient documentation

## 2015-12-26 DIAGNOSIS — F418 Other specified anxiety disorders: Secondary | ICD-10-CM | POA: Insufficient documentation

## 2015-12-26 LAB — CBC WITH DIFFERENTIAL/PLATELET
BASOS ABS: 0 10*3/uL (ref 0–0.1)
BASOS PCT: 0 %
EOS ABS: 0.1 10*3/uL (ref 0–0.7)
Eosinophils Relative: 2 %
HEMATOCRIT: 43.1 % (ref 35.0–47.0)
HEMOGLOBIN: 14.5 g/dL (ref 12.0–16.0)
Lymphocytes Relative: 35 %
Lymphs Abs: 2.4 10*3/uL (ref 1.0–3.6)
MCH: 26.9 pg (ref 26.0–34.0)
MCHC: 33.7 g/dL (ref 32.0–36.0)
MCV: 79.9 fL — ABNORMAL LOW (ref 80.0–100.0)
MONO ABS: 0.5 10*3/uL (ref 0.2–0.9)
MONOS PCT: 7 %
NEUTROS ABS: 3.8 10*3/uL (ref 1.4–6.5)
NEUTROS PCT: 56 %
Platelets: 264 10*3/uL (ref 150–440)
RBC: 5.4 MIL/uL — ABNORMAL HIGH (ref 3.80–5.20)
RDW: 14.3 % (ref 11.5–14.5)
WBC: 6.8 10*3/uL (ref 3.6–11.0)

## 2015-12-26 LAB — COMPREHENSIVE METABOLIC PANEL
ALBUMIN: 4.7 g/dL (ref 3.5–5.0)
ALT: 49 U/L (ref 14–54)
ANION GAP: 10 (ref 5–15)
AST: 53 U/L — AB (ref 15–41)
Alkaline Phosphatase: 108 U/L (ref 38–126)
BILIRUBIN TOTAL: 0.7 mg/dL (ref 0.3–1.2)
BUN: 7 mg/dL (ref 6–20)
CO2: 23 mmol/L (ref 22–32)
Calcium: 9.6 mg/dL (ref 8.9–10.3)
Chloride: 103 mmol/L (ref 101–111)
Creatinine, Ser: 0.51 mg/dL (ref 0.44–1.00)
GFR calc Af Amer: >60 mL/min (ref >60)
GFR calc non Af Amer: >60 mL/min (ref >60)
GLUCOSE: 98 mg/dL (ref 65–99)
POTASSIUM: 3.4 mmol/L — AB (ref 3.5–5.1)
SODIUM: 136 mmol/L (ref 135–145)
Total Protein: 8.4 g/dL — ABNORMAL HIGH (ref 6.5–8.1)

## 2015-12-26 LAB — POCT PREGNANCY, URINE: Preg Test, Ur: NEGATIVE

## 2015-12-26 LAB — ETHANOL

## 2015-12-26 LAB — URINE DRUG SCREEN, QUALITATIVE (ARMC ONLY)
AMPHETAMINES, UR SCREEN: NOT DETECTED
Barbiturates, Ur Screen: NOT DETECTED
Benzodiazepine, Ur Scrn: NOT DETECTED
CANNABINOID 50 NG, UR ~~LOC~~: NOT DETECTED
COCAINE METABOLITE, UR ~~LOC~~: NOT DETECTED
MDMA (ECSTASY) UR SCREEN: NOT DETECTED
Methadone Scn, Ur: NOT DETECTED
OPIATE, UR SCREEN: NOT DETECTED
PHENCYCLIDINE (PCP) UR S: NOT DETECTED
Tricyclic, Ur Screen: NOT DETECTED

## 2015-12-26 MED ORDER — FLUCONAZOLE 50 MG PO TABS
150.0000 mg | ORAL_TABLET | Freq: Once | ORAL | Status: AC
  Administered 2015-12-26: 150 mg via ORAL
  Filled 2015-12-26: qty 3

## 2015-12-26 MED ORDER — ACETAMINOPHEN 500 MG PO TABS
ORAL_TABLET | ORAL | Status: AC
  Filled 2015-12-26: qty 1

## 2015-12-26 MED ORDER — IBUPROFEN 800 MG PO TABS
800.0000 mg | ORAL_TABLET | Freq: Once | ORAL | Status: AC
  Administered 2015-12-26: 800 mg via ORAL

## 2015-12-26 MED ORDER — ACETAMINOPHEN 325 MG PO TABS
ORAL_TABLET | ORAL | Status: AC
  Filled 2015-12-26: qty 1

## 2015-12-26 MED ORDER — FLUCONAZOLE 150 MG PO TABS: 150.0000 mg | ORAL_TABLET | Freq: Once | ORAL | Status: DC

## 2015-12-26 MED ORDER — DIAZEPAM 5 MG PO TABS
10.0000 mg | ORAL_TABLET | Freq: Once | ORAL | Status: AC
  Administered 2015-12-26: 10 mg via ORAL

## 2015-12-26 MED ORDER — DIAZEPAM 5 MG PO TABS
ORAL_TABLET | ORAL | Status: AC
  Administered 2015-12-26: 10 mg via ORAL
  Filled 2015-12-26: qty 2

## 2015-12-26 NOTE — ED Provider Notes (Signed)
[facility] Emergency Department Provider Note        Time seen: ----------------------------------------- 5:47 PM on 12/26/2015 -----------------------------------------    I have reviewed the triage vital signs and the nursing notes.   HISTORY  Chief Complaint Dizziness and Vaginal Discharge    HPI Cheryl Fitzgerald is a 20 y.o. female who presents to the ER for abdominal pain, vaginal discharge, syncope, feelings of dehydration. Patient states she is homeless and walking the streets. She arrives tearful and anxious asking for something to be. She denies recent illness but states she has had vaginal discharge the makes it hard for her to walk.   Past Medical History  Diagnosis Date  . Depression     no medication  . Adjustment disorder with mixed emotional features 03/23/2015  . Shortness of breath dyspnea     Patient Active Problem List   Diagnosis Date Noted  . Acquired hyperbilirubinemia   . Choledocholithiasis   . Non-specific filling defect of common bile duct   . Calculous cholecystitis with obstruction 08/08/2015  . 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  . Post-dates pregnancy 04/05/2015  . Adjustment disorder with mixed emotional features 03/23/2015  . Depression complicating pregnancy, antepartum 03/22/2015  . Supervision of high risk pregnancy due to social problems in third trimester 03/22/2015  . 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 Inadequate housing 03/22/2015  . Food hunger 03/22/2015  . Decreased fetal movement determined by examination 03/06/2015    Past Surgical History  Procedure Laterality Date  . No past surgeries    . Vaginal delivery  04/07/2015  . Cholecystectomy N/A 08/08/2015    Procedure: LAPAROSCOPIC CHOLECYSTECTOMY WITH INTRAOPERATIVE CHOLANGIOGRAM;  Surgeon: Leafy Ro, MD;  Location: [facility] ORS;  Service: General;  Laterality: N/A;  . Endoscopic retrograde cholangiopancreatography  (ercp) with propofol N/A 08/09/2015    Procedure: ENDOSCOPIC RETROGRADE CHOLANGIOPANCREATOGRAPHY (ERCP) WITH PROPOFOL;  Surgeon: Midge Minium, MD;  Location: [facility] ENDOSCOPY;  Service: Endoscopy;  Laterality: N/A;    Allergies Review of patient's allergies indicates no known allergies.  Social History Social History  Substance Use Topics  . Smoking status: Former Games developer  . Smokeless tobacco: Never Used  . Alcohol Use: No    Review of Systems Constitutional: Negative for fever. Cardiovascular: Negative for chest pain. Respiratory: Negative for shortness of breath. Gastrointestinal: Positive for abdominal pain Genitourinary: Positive for vaginal discharge Musculoskeletal: Negative for back pain. Skin: Negative for rash. Neurological: Negative for headaches, focal weakness or numbness. Psychiatric: Positive for anxiety and depression  10-point ROS otherwise negative.  ____________________________________________   PHYSICAL EXAM:  VITAL SIGNS: ED Triage Vitals  Enc Vitals Group     BP 12/26/15 1728 108/67 mmHg     Pulse Rate 12/26/15 1728 70     Resp 12/26/15 1728 20     Temp 12/26/15 1728 98.1 F (36.7 C)     Temp Source 12/26/15 1728 Oral     SpO2 12/26/15 1728 100 %     Weight 12/26/15 1728 185 lb (83.915 kg)     Height 12/26/15 1728  (1.651 m)     Head Cir --      Peak Flow --      Pain Score --      Pain Loc --      Pain Edu? --      Excl. in GC? --     Constitutional: Alert and oriented. Anxious, mild distress Eyes: Conjunctivae are normal. PERRL. Normal extraocular movements. ENT   Head:  Normocephalic and atraumatic.   Nose: No congestion/rhinnorhea.   Mouth/Throat: Mucous membranes are moist.   Neck: No stridor. Cardiovascular: Normal rate, regular rhythm. No murmurs, rubs, or gallops. Respiratory: Normal respiratory effort without tachypnea nor retractions. Breath sounds are clear and equal bilaterally. No  wheezes/rales/rhonchi. Gastrointestinal: Soft and nontender. Normal bowel sounds Musculoskeletal: Nontender with normal range of motion in all extremities. No lower extremity tenderness nor edema. Neurologic:  Normal speech and language. No gross focal neurologic deficits are appreciated.  Skin:  Skin is warm, dry and intact. No rash noted. Psychiatric: Anxious, almost hysterical at times. ____________________________________________  ED COURSE:  Pertinent labs & imaging results that were available during my care of the patient were reviewed by me and considered in my medical decision making (see chart for details). Patient presents to the ER for multiple complaints. She will need psychiatric evaluation. ____________________________________________    LABS (pertinent positives/negatives)  Labs Reviewed  CBC WITH DIFFERENTIAL/PLATELET - Abnormal; Notable for the following:    RBC 5.40 (*)    MCV 79.9 (*)    All other components within normal limits  COMPREHENSIVE METABOLIC PANEL - Abnormal; Notable for the following:    Potassium 3.4 (*)    Total Protein 8.4 (*)    AST 53 (*)    All other components within normal limits  URINE DRUG SCREEN, QUALITATIVE ([facility] ONLY)  ETHANOL  POC URINE PREG, ED  POCT PREGNANCY, URINE   ____________________________________________  FINAL ASSESSMENT AND PLAN  Anxiety and depression  Plan: Patient with labs as dictated above. Patient is in no acute distress, has been evaluated by behavioral health intake. Patient does not want to stay and see his psychiatrist tomorrow. She is denies being a threat to herself or anyone else. She is currently being seen by [facility] for depression. She is leaving against my advice.   Emily FilbertWilliams, Jonathan E, MD   Note: This dictation was prepared with Dragon dictation. Any transcriptional errors that result from this process are unintentional   Emily FilbertJonathan E Williams, MD 12/26/15 2041
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
## 2015-12-26 NOTE — ED Notes (Signed)
Pt to ed via [facility] with abd pain and vaginal bleeding. Just seen here last week.

## 2015-12-26 NOTE — Discharge Instructions (Signed)
Panic Attacks °Panic attacks are sudden, short-lived surges of severe anxiety, fear, or discomfort. They may occur for no reason when you are relaxed, when you are anxious, or when you are sleeping. Panic attacks may occur for a number of reasons:  °· Healthy people occasionally have panic attacks in extreme, life-threatening situations, such as war or natural disasters. Normal anxiety is a protective mechanism of the body that helps us react to danger (fight or flight response). °· Panic attacks are often seen with anxiety disorders, such as panic disorder, social anxiety disorder, generalized anxiety disorder, and phobias. Anxiety disorders cause excessive or uncontrollable anxiety. They may interfere with your relationships or other life activities. °· Panic attacks are sometimes seen with other mental illnesses, such as depression and posttraumatic stress disorder. °· Certain medical conditions, prescription medicines, and drugs of abuse can cause panic attacks. °SYMPTOMS  °Panic attacks start suddenly, peak within 20 minutes, and are accompanied by four or more of the following symptoms: °· Pounding heart or fast heart rate (palpitations). °· Sweating. °· Trembling or shaking. °· Shortness of breath or feeling smothered. °· Feeling choked. °· Chest pain or discomfort. °· Nausea or strange feeling in your stomach. °· Dizziness, light-headedness, or feeling like you will faint. °· Chills or hot flushes. °· Numbness or tingling in your lips or hands and feet. °· Feeling that things are not real or feeling that you are not yourself. °· Fear of losing control or going crazy. °· Fear of dying. °Some of these symptoms can mimic serious medical conditions. For example, you may think you are having a heart attack. Although panic attacks can be very scary, they are not life threatening. °DIAGNOSIS  °Panic attacks are diagnosed through an assessment by your health care provider. Your health care provider will ask  questions about your symptoms, such as where and when they occurred. Your health care provider will also ask about your medical history and use of alcohol and drugs, including prescription medicines. Your health care provider may order blood tests or other studies to rule out a serious medical condition. Your health care provider may refer you to a mental health professional for further evaluation. °TREATMENT  °· Most healthy people who have one or two panic attacks in an extreme, life-threatening situation will not require treatment. °· The treatment for panic attacks associated with anxiety disorders or other mental illness typically involves counseling with a mental health professional, medicine, or a combination of both. Your health care provider will help determine what treatment is best for you. °· Panic attacks due to physical illness usually go away with treatment of the illness. If prescription medicine is causing panic attacks, talk with your health care provider about stopping the medicine, decreasing the dose, or substituting another medicine. °· Panic attacks due to alcohol or drug abuse go away with abstinence. Some adults need professional help in order to stop drinking or using drugs. °HOME CARE INSTRUCTIONS  °· Take all medicines as directed by your health care provider.   °· Schedule and attend follow-up visits as directed by your health care provider. It is important to keep all your appointments. °SEEK MEDICAL CARE IF: °· You are not able to take your medicines as prescribed. °· Your symptoms do not improve or get worse. °SEEK IMMEDIATE MEDICAL CARE IF:  °· You experience panic attack symptoms that are different than your usual symptoms. °· You have serious thoughts about hurting yourself or others. °· You are taking medicine for panic attacks and   have a serious side effect. MAKE SURE YOU:  Understand these instructions.  Will watch your condition.  Will get help right away if you are not  doing well or get worse.   This information is not intended to replace advice given to you by your health care provider. Make sure you discuss any questions you have with your health care provider.   Document Released: 05/25/2005 Document Revised: 05/30/2013 Document Reviewed: 01/06/2013 Elsevier Interactive Patient Education 2016 Elsevier Inc.  Major Depressive Disorder Major depressive disorder is a mental illness. It also may be called clinical depression or unipolar depression. Major depressive disorder usually causes feelings of sadness, hopelessness, or helplessness. Some people with this disorder do not feel particularly sad but lose interest in doing things they used to enjoy (anhedonia). Major depressive disorder also can cause physical symptoms. It can interfere with work, school, relationships, and other normal everyday activities. The disorder varies in severity but is longer lasting and more serious than the sadness we all feel from time to time in our lives. Major depressive disorder often is triggered by stressful life events or major life changes. Examples of these triggers include divorce, loss of your job or home, a move, and the death of a family member or close friend. Sometimes this disorder occurs for no obvious reason at all. People who have family members with major depressive disorder or bipolar disorder are at higher risk for developing this disorder, with or without life stressors. Major depressive disorder can occur at any age. It may occur just once in your life (single episode major depressive disorder). It may occur multiple times (recurrent major depressive disorder). SYMPTOMS People with major depressive disorder have either anhedonia or depressed mood on nearly a daily basis for at least 2 weeks or longer. Symptoms of depressed mood include:  Feelings of sadness (blue or down in the dumps) or emptiness.  Feelings of hopelessness or helplessness.  Tearfulness or  episodes of crying (may be observed by others).  Irritability (children and adolescents). In addition to depressed mood or anhedonia or both, people with this disorder have at least four of the following symptoms:  Difficulty sleeping or sleeping too much.   Significant change (increase or decrease) in appetite or weight.   Lack of energy or motivation.  Feelings of guilt and worthlessness.   Difficulty concentrating, remembering, or making decisions.  Unusually slow movement (psychomotor retardation) or restlessness (as observed by others).   Recurrent wishes for death, recurrent thoughts of self-harm (suicide), or a suicide attempt. People with major depressive disorder commonly have persistent negative thoughts about themselves, other people, and the world. People with severe major depressive disorder may experiencedistorted beliefs or perceptions about the world (psychotic delusions). They also may see or hear things that are not real (psychotic hallucinations). DIAGNOSIS Major depressive disorder is diagnosed through an assessment by your health care provider. Your health care provider will ask aboutaspects of your daily life, such as mood,sleep, and appetite, to see if you have the diagnostic symptoms of major depressive disorder. Your health care provider may ask about your medical history and use of alcohol or drugs, including prescription medicines. Your health care provider also may do a physical exam and blood work. This is because certain medical conditions and the use of certain substances can cause major depressive disorder-like symptoms (secondary depression). Your health care provider also may refer you to a mental health specialist for further evaluation and treatment. TREATMENT It is important to recognize the symptoms  of major depressive disorder and seek treatment. The following treatments can be prescribed for this disorder:   Medicine. Antidepressant medicines  usually are prescribed. Antidepressant medicines are thought to correct chemical imbalances in the brain that are commonly associated with major depressive disorder. Other types of medicine may be added if the symptoms do not respond to antidepressant medicines alone or if psychotic delusions or hallucinations occur.  Talk therapy. Talk therapy can be helpful in treating major depressive disorder by providing support, education, and guidance. Certain types of talk therapy also can help with negative thinking (cognitive behavioral therapy) and with relationship issues that trigger this disorder (interpersonal therapy). A mental health specialist can help determine which treatment is best for you. Most people with major depressive disorder do well with a combination of medicine and talk therapy. Treatments involving electrical stimulation of the brain can be used in situations with extremely severe symptoms or when medicine and talk therapy do not work over time. These treatments include electroconvulsive therapy, transcranial magnetic stimulation, and vagal nerve stimulation.   This information is not intended to replace advice given to you by your health care provider. Make sure you discuss any questions you have with your health care provider.   Document Released: 09/19/2012 Document Revised: 06/15/2014 Document Reviewed: 09/19/2012 Elsevier Interactive Patient Education 2016 [employer]  Vaginitis Vaginitis is an inflammation of the vagina. It is most often caused by a change in the normal balance of the bacteria and yeast that live in the vagina. This change in balance causes an overgrowth of certain bacteria or yeast, which causes the inflammation. There are different types of vaginitis, but the most common types are:  Bacterial vaginosis.  Yeast infection (candidiasis).  Trichomoniasis vaginitis. This is a sexually transmitted infection (STI).  Viral vaginitis.  Atrophic  vaginitis.  Allergic vaginitis. CAUSES  The cause depends on the type of vaginitis. Vaginitis can be caused by:  Bacteria (bacterial vaginosis).  Yeast (yeast infection).  A parasite (trichomoniasis vaginitis)  A virus (viral vaginitis).  Low hormone levels (atrophic vaginitis). Low hormone levels can occur during pregnancy, breastfeeding, or after menopause.  Irritants, such as bubble baths, scented tampons, and feminine sprays (allergic vaginitis). Other factors can change the normal balance of the yeast and bacteria that live in the vagina. These include:  Antibiotic medicines.  Poor hygiene.  Diaphragms, vaginal sponges, spermicides, birth control pills, and intrauterine devices (IUD).  Sexual intercourse.  Infection.  Uncontrolled diabetes.  A weakened immune system. SYMPTOMS  Symptoms can vary depending on the cause of the vaginitis. Common symptoms include:  Abnormal vaginal discharge.  The discharge is white, gray, or yellow with bacterial vaginosis.  The discharge is thick, white, and cheesy with a yeast infection.  The discharge is frothy and yellow or greenish with trichomoniasis.  A bad vaginal odor.  The odor is fishy with bacterial vaginosis.  Vaginal itching, pain, or swelling.  Painful intercourse.  Pain or burning when urinating. Sometimes, there are no symptoms. TREATMENT  Treatment will vary depending on the type of infection.   Bacterial vaginosis and trichomoniasis are often treated with antibiotic creams or pills.  Yeast infections are often treated with antifungal medicines, such as vaginal creams or suppositories.  Viral vaginitis has no cure, but symptoms can be treated with medicines that relieve discomfort. Your sexual partner should be treated as well.  Atrophic vaginitis may be treated with an estrogen cream, pill, suppository, or vaginal ring. If vaginal dryness occurs, lubricants and moisturizing creams  may help. You may be  told to avoid scented soaps, sprays, or douches.  Allergic vaginitis treatment involves quitting the use of the product that is causing the problem. Vaginal creams can be used to treat the symptoms. HOME CARE INSTRUCTIONS   Take all medicines as directed by your caregiver.  Keep your genital area clean and dry. Avoid soap and only rinse the area with water.  Avoid douching. It can remove the healthy bacteria in the vagina.  Do not use tampons or have sexual intercourse until your vaginitis has been treated. Use sanitary pads while you have vaginitis.  Wipe from front to back. This avoids the spread of bacteria from the rectum to the vagina.  Let air reach your genital area.  Wear cotton underwear to decrease moisture buildup.  Avoid wearing underwear while you sleep until your vaginitis is gone.  Avoid tight pants and underwear or nylons without a cotton panel.  Take off wet clothing (especially bathing suits) as soon as possible.  Use mild, non-scented products. Avoid using irritants, such as:  Scented feminine sprays.  Fabric softeners.  Scented detergents.  Scented tampons.  Scented soaps or bubble baths.  Practice safe sex and use condoms. Condoms may prevent the spread of trichomoniasis and viral vaginitis. SEEK MEDICAL CARE IF:   You have abdominal pain.  You have a fever or persistent symptoms for more than 2-3 days.  You have a fever and your symptoms suddenly get worse.   This information is not intended to replace advice given to you by your health care provider. Make sure you discuss any questions you have with your health care provider.   Document Released: 03/22/2007 Document Revised: 10/09/2014 Document Reviewed: 11/05/2011 Elsevier Interactive Patient Education [employer]

## 2015-12-26 NOTE — ED Notes (Signed)
Patient is tearful and anxious in triage.  Asking for something to eat.  Emotional support given with minimal effect.

## 2015-12-26 NOTE — ED Notes (Signed)
C/O yeast infection for a week and feeling dizzy x 1 day.  Patient states she is dehydrated.  Patient states she is homeless and is walking the streets.  Patient is AAOx3.  Skin warm and dry.  Tearful in triage.  Mucous membranes moist.

## 2015-12-28 ENCOUNTER — Emergency Department
Admission: EM | Admit: 2015-12-28 | Discharge: 2015-12-28 | Disposition: A | Discharge status: Home / Self Care | Payer: Self-pay | Attending: Emergency Medicine | Admitting: Emergency Medicine

## 2015-12-28 ENCOUNTER — Encounter: Payer: Self-pay | Admitting: Emergency Medicine

## 2015-12-28 DIAGNOSIS — F1290 Cannabis use, unspecified, uncomplicated: Secondary | ICD-10-CM | POA: Insufficient documentation

## 2015-12-28 DIAGNOSIS — Z87891 Personal history of nicotine dependence: Secondary | ICD-10-CM | POA: Insufficient documentation

## 2015-12-28 DIAGNOSIS — F4322 Adjustment disorder with anxiety: Secondary | ICD-10-CM | POA: Insufficient documentation

## 2015-12-28 LAB — URINALYSIS COMPLETE WITH MICROSCOPIC (ARMC ONLY)
BILIRUBIN URINE: NEGATIVE
Glucose, UA: NEGATIVE mg/dL
HGB URINE DIPSTICK: NEGATIVE
KETONES UR: NEGATIVE mg/dL
NITRITE: NEGATIVE
PH: 7 (ref 5.0–8.0)
Protein, ur: NEGATIVE mg/dL
Specific Gravity, Urine: 1.013 (ref 1.005–1.030)

## 2015-12-28 MED ORDER — ACETAMINOPHEN 325 MG PO TABS
650.0000 mg | ORAL_TABLET | Freq: Once | ORAL | Status: AC
  Administered 2015-12-28: 650 mg via ORAL
  Filled 2015-12-28: qty 2

## 2015-12-28 NOTE — Discharge Instructions (Signed)
You have been seen in the Emergency Department (ED)  today for anxiety after an emotional incident.  You have been evaluated by the behavioral medicine specialists and are being referred to:  [facility] [address] [place], [place] [address] Phone:  [phone] or [phone]  Open Access:   Walk-in ASSESSMENT hours, M-W-F, 8:00am - 3:00pm Advanced Acess CRISIS:  M-F, 8:00am - 8:00pm Outpatient Services Office Hours:  M-F, 8:00am - 5:00pm  Please return to the Emergency Department (ED)  immediately if you have ANY thoughts of hurting yourself or anyone else, so that we may help you.  Please avoid alcohol and drug use.  Follow up with your doctor and/or therapist as soon as possible regarding today's ED  visit.   Please follow up any other recommendations and clinic appointments provided by the psychiatry team that saw you in the Emergency Department.   Adjustment Disorder Adjustment disorder is an unusually severe reaction to a stressful life event, such as the loss of a job or physical illness. The event may be any stressful event other than the loss of a loved one. Adjustment disorder may affect your feelings, your thinking, how you act, or a combination of these. It may interfere with personal relationships or with the way you are at work, school, or home. People with this disorder are at risk for suicide and substance abuse. They may develop a more serious mental disorder, such as major depressive disorder or post-traumatic stress disorder. SIGNS AND SYMPTOMS  Symptoms may include:  Sadness, depressed mood, or crying spells.  Loss of enjoyment.  Change in appetite or weight.  Sense of loss or hopelessness.  Thoughts of suicide.  Anxiety, worry, or nervousness.  Trouble sleeping.  Avoiding family and friends.  Poor school performance.  Fighting or vandalism.  Reckless driving.  Skipping school.  Poor work  International aid/development worker.  Ignoring bills. Symptoms of adjustment disorder start within 3 months of the stressful life event. They do not last more than 6 months after the event has ended. DIAGNOSIS  To make a diagnosis, your health care provider will ask about what has happened in your life and how it has affected you. He or she may also ask about your medical history and use of medicines, alcohol, and other substances. Your health care provider may do a physical exam and order lab tests or other studies. You may be referred to a mental health specialist for evaluation. TREATMENT  Treatment options include:  Counseling or talk therapy. Talk therapy is usually provided by mental health specialists.  Medicine. Certain medicines may help with depression, anxiety, and sleep.  Support groups. Support groups offer emotional support, advice, and guidance. They are made up of people who have had similar experiences. HOME CARE INSTRUCTIONS  Keep all follow-up visits as directed by your health care provider. This is important.  Take medicines only as directed by your health care provider. SEEK MEDICAL CARE IF:  Your symptoms get worse.  SEEK IMMEDIATE MEDICAL CARE IF: You have serious thoughts about hurting yourself or someone else. MAKE SURE YOU:  Understand these instructions.  Will watch your condition.  Will get help right away if you are not doing well or get worse.   This information is not intended to replace advice given to you by your health care provider. Make sure you discuss any questions you have with your health care provider.   Document Released: 01/27/2006 Document Revised: 06/15/2014 Document Reviewed: 10/17/2013 Elsevier Interactive  Patient Education 2016 [employer].
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
## 2015-12-28 NOTE — ED Notes (Signed)
Pt refusing blood work at this time. Pt states she has not eaten and does not want her blood drawn. Pt states she was just here a few days ago and does not need her blood drawn.
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
## 2015-12-30 LAB — URINE CULTURE: Special Requests: NORMAL

## 2016-02-11 ENCOUNTER — Encounter: Payer: Self-pay | Admitting: Emergency Medicine

## 2016-02-11 ENCOUNTER — Emergency Department
Admission: EM | Admit: 2016-02-11 | Discharge: 2016-02-11 | Disposition: A | Discharge status: Home / Self Care | Payer: Self-pay | Attending: Student in an Organized Health Care Education/Training Program | Admitting: Student in an Organized Health Care Education/Training Program

## 2016-02-11 DIAGNOSIS — R509 Fever, unspecified: Secondary | ICD-10-CM | POA: Insufficient documentation

## 2016-02-11 DIAGNOSIS — R61 Generalized hyperhidrosis: Secondary | ICD-10-CM | POA: Insufficient documentation

## 2016-02-11 DIAGNOSIS — Z87891 Personal history of nicotine dependence: Secondary | ICD-10-CM | POA: Insufficient documentation

## 2016-02-11 DIAGNOSIS — R002 Palpitations: Secondary | ICD-10-CM | POA: Insufficient documentation

## 2016-02-11 DIAGNOSIS — K0889 Other specified disorders of teeth and supporting structures: Secondary | ICD-10-CM | POA: Insufficient documentation

## 2016-02-11 DIAGNOSIS — G43909 Migraine, unspecified, not intractable, without status migrainosus: Secondary | ICD-10-CM | POA: Insufficient documentation

## 2016-02-11 DIAGNOSIS — F329 Major depressive disorder, single episode, unspecified: Secondary | ICD-10-CM | POA: Insufficient documentation

## 2016-02-11 MED ORDER — TRAMADOL HCL 50 MG PO TABS: 50.0000 mg | ORAL_TABLET | Freq: Four times a day (QID) | ORAL | 0 refills | Status: DC | PRN

## 2016-02-11 MED ORDER — LIDOCAINE VISCOUS 2 % MT SOLN
15.0000 mL | Freq: Once | OROMUCOSAL | Status: AC
  Administered 2016-02-11: 15 mL via OROMUCOSAL
  Filled 2016-02-11: qty 15

## 2016-02-11 MED ORDER — AMOXICILLIN 500 MG PO CAPS: 500.0000 mg | ORAL_CAPSULE | Freq: Three times a day (TID) | ORAL | 0 refills | Status: DC

## 2016-02-11 MED ORDER — IBUPROFEN 600 MG PO TABS: 600.0000 mg | ORAL_TABLET | Freq: Three times a day (TID) | ORAL | 0 refills | Status: DC | PRN

## 2016-02-11 NOTE — Discharge Instructions (Signed)
Follow-up from the list of dental clinics below if you cannot make the walk-in appointment next week. OPTIONS FOR DENTAL FOLLOW UP CARE  [place] Department of Health and Human Services - Local Safety Net Dental Clinics [email or web address].ncdhhs.gov/dph/oralhealth/services/safetynetclinics.htm   [facility] [phone])  [facility] [phone])  [facility] [phone]  [facility] [phone])  [facility] [phone]) This clinic caters to the indigent population and is on a lottery system. Location: [employer] [facility] [address], [address], [place] [facility] Hours: Wednesdays from 6pm - 9pm, patients seen by a lottery system. For dates, call or go to [email or web address] Services: Cleanings, fillings and simple extractions. Payment Options: DENTAL WORK IS FREE OF CHARGE. Bring proof of income or support. Best way to get seen: Arrive at 5:15 pm - this is a lottery, NOT first come/first serve, so arriving earlier will not increase your chances of being seen.     [facility] [phone] Select option 1 for emergencies   Location: [facility], [address], [place] [facility] Hours: No walk-ins accepted - call the day before to schedule an appointment. Check in times are 9:30 am and 1:30 pm. Services: Simple extractions, temporary fillings, pulpectomy/pulp debridement, uncomplicated abscess drainage. Payment Options: PAYMENT IS DUE AT THE TIME OF SERVICE.  Fee is usually $100-200, additional surgical procedures (e.g. abscess drainage) may be extra. Cash, checks, Visa/MasterCard accepted.  Can file Medicaid if patient is covered for dental - patient should call case worker to check. No discount for [facility] patients. Best way to get seen: MUST call the day before and get onto the schedule. Can usually be seen  the next 1-2 days. No walk-ins accepted.     [facility] [phone]   Location: [facility], [address], [place] [facility] Hours: M, W, Th, F 8am or 1:30pm, Tues 9a or 1:30 - first come/first served. Services: Simple extractions, temporary fillings, uncomplicated abscess drainage.  You do not need to be [place] resident. Payment Options: PAYMENT IS DUE AT THE TIME OF SERVICE. Dental insurance, otherwise sliding scale - bring proof of income or support. Depending on income and treatment needed, cost is usually $50-200. Best way to get seen: Arrive early as it is first come/first served.     [facility] [phone]   Location: [address] Clinic Hours: Mon-Thu 8a-5p Services: Most basic dental services including extractions and fillings. Payment Options: PAYMENT IS DUE AT THE TIME OF SERVICE. Sliding scale, up to 50% off - bring proof if income or support. Medicaid with dental option accepted. Best way to get seen: Call to schedule an appointment, can usually be seen within 2 weeks OR they will try to see walk-ins - show up at 8a or 2p (you may have to wait).     [facility] [phone] [place] RESIDENTS ONLY   Location: [facility], [address], [place], [place] [id number] Clinic Hours: By appointment only. Monday - Thursday 8am-5pm, Friday 8am-12pm Services: Cleanings, fillings, extractions. Payment Options: PAYMENT IS DUE AT THE TIME OF SERVICE. Cash, Visa or MasterCard. Sliding scale - $30 minimum per service. Best way to get seen: Come in to office, complete packet and make an appointment - need proof of income or support monies for each household member and proof of [place] residence. Usually takes about a month to get  in.     [facility] [phone]   Location: [place].,  [place] [facility] Hours: Walk-in Urgent Care Dental Services are offered Monday-Friday mornings only. The numbers of emergencies accepted daily is limited to the number of providers available. Maximum 15 - Mondays, Wednesdays & Thursdays Maximum 10 - Tuesdays & Fridays Services: You do not need to be a [facility] resident to be seen for a dental emergency. Emergencies are defined as pain, swelling, abnormal bleeding, or dental trauma. Walkins will receive x-rays if needed. NOTE: Dental cleaning is not an emergency. Payment Options: PAYMENT IS DUE AT THE TIME OF SERVICE. Minimum co-pay is $40.00 for uninsured patients. Minimum co-pay is $3.00 for Medicaid with dental coverage. Dental Insurance is accepted and must be presented at time of visit. Medicare does not cover dental. Forms of payment: Cash, credit card, checks. Best way to get seen: If not previously registered with the clinic, walk-in dental registration begins at 7:15 am and is on a first come/first serve basis. If previously registered with the clinic, call to make an appointment.     [facility] [place] ONLY   Location: [address] [place], [place] Clinic Hours: Mon-Thu 10a-2p Services: Extractions only! Payment Options: FREE (donations accepted) - bring proof of income or support Best way to get seen: Call and schedule an appointment OR come at 8am on the 1st Monday of every month (except for holidays) when it is first come/first served.     [facility] [phone]   Location: [place], [place] Clinic Hours: Friday mornings Services, Payment Options, Best way to get seen: Call for info

## 2016-02-11 NOTE — ED Provider Notes (Signed)
[facility] Emergency Department Provider Note   ____________________________________________   None    (approximate)  I have reviewed the triage vital signs and the nursing notes.   HISTORY  Chief Complaint Dental Pain    HPI Cheryl RubensteinKarin L Fitzgerald is a 20 y.o. female  Presents with dental pain x1 year that has worsened significantly over the last month. Pain is in right mandible, constant, and exacerbated with opening mouth. Tooth is decayed and gums erythematous and eroded. Trouble with eating, talking, sleeping. Has been taking extra strength tylenol which helps a little with the pain. Says the pain is unbearable today. Patient claims she has experienced fevers, night sweats, migraines, palpitations. Denies any lesions on hands and feet, difficulty urinating.  Has not seen a dentist due to financial restrictions and patient is uninsured.   Past Medical History:  Diagnosis Date  . Adjustment disorder with mixed emotional features 03/23/2015  . Depression    no medication  . Shortness of breath dyspnea     Patient Active Problem List   Diagnosis Date Noted  . Acquired hyperbilirubinemia   . Choledocholithiasis   . Non-specific filling defect of common bile duct   . Calculous cholecystitis with obstruction 08/08/2015  . 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  . Post-dates pregnancy 04/05/2015  . Adjustment disorder with mixed emotional features 03/23/2015  . Depression complicating pregnancy, antepartum 03/22/2015  . Supervision of high risk pregnancy due to social problems in third trimester 03/22/2015  . 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 Inadequate housing 03/22/2015  . Food hunger 03/22/2015  . Decreased fetal movement determined by examination 03/06/2015    Past Surgical History:  Procedure Laterality Date  . CHOLECYSTECTOMY N/A 08/08/2015   Procedure: LAPAROSCOPIC CHOLECYSTECTOMY WITH INTRAOPERATIVE CHOLANGIOGRAM;   Surgeon: Leafy Roiego F Pabon, MD;  Location: [facility] ORS;  Service: General;  Laterality: N/A;  . ENDOSCOPIC RETROGRADE CHOLANGIOPANCREATOGRAPHY (ERCP) WITH PROPOFOL N/A 08/09/2015   Procedure: ENDOSCOPIC RETROGRADE CHOLANGIOPANCREATOGRAPHY (ERCP) WITH PROPOFOL;  Surgeon: Midge Miniumarren Wohl, MD;  Location: [facility] ENDOSCOPY;  Service: Endoscopy;  Laterality: N/A;  . NO PAST SURGERIES    . VAGINAL DELIVERY  04/07/2015    Prior to Admission medications   Medication Sig Start Date End Date Taking? Authorizing Provider  acetaminophen (TYLENOL) 500 MG tablet Take 500-1,000 mg by mouth every 6 (six) hours as needed for mild pain or headache.     Historical Provider, MD  amoxicillin (AMOXIL) 500 MG capsule Take 1 capsule (500 mg total) by mouth 3 (three) times daily. 02/11/16   Joni Reiningonald K Lesia Monica, PA-C  fluconazole (DIFLUCAN) 150 MG tablet Take 1 tablet (150 mg total) by mouth once. 12/26/15   Emily FilbertJonathan E Williams, MD  ibuprofen (ADVIL,MOTRIN) 600 MG tablet Take 1 tablet (600 mg total) by mouth every 8 (eight) hours as needed. 02/11/16   Joni Reiningonald K Berle Fitz, PA-C  ibuprofen (ADVIL,MOTRIN) 800 MG tablet Take 1 tablet (800 mg total) by mouth every 8 (eight) hours as needed. 12/11/15   Emily FilbertJonathan E Williams, MD  medroxyPROGESTERone (PROVERA) 10 MG tablet Take 1 tablet (10 mg total) by mouth daily. 12/11/15   Emily FilbertJonathan E Williams, MD  Multiple Vitamin (MULTIVITAMIN WITH MINERALS) TABS tablet Take 1 tablet by mouth daily.    Historical Provider, MD  ondansetron (ZOFRAN) 4 MG tablet Take 1 tablet (4 mg total) by mouth daily as needed for nausea or vomiting. 11/02/15   Jeanmarie PlantJames A McShane, MD  traMADol (ULTRAM) 50 MG tablet Take 1 tablet (50 mg total) by mouth every  6 (six) hours as needed. 02/11/16 02/10/17  Joni Reining, PA-C    Allergies Review of patient's allergies indicates no known allergies.  No family history on file.  Social History Social History  Substance Use Topics  . Smoking status: Former Games developer  . Smokeless tobacco: Never Used  .  Alcohol use No    Review of Systems Constitutional: fevers, night sweats Eyes: No visual changes. ENT: dental pain on the right side. Denies swelling in the neck. Cardiovascular: Denies chest pain. +palpitations Respiratory: Denies shortness of breath. Gastrointestinal: No abdominal pain.  No nausea, no vomiting.  No diarrhea.  No constipation. Genitourinary: Negative for dysuria. Musculoskeletal: Negative for back pain. Skin: Negative for rash. Negative for any lesions. Neurological: Negative for focal weakness or numbness. +Migraines Psychiatric:depression 10-point ROS otherwise negative.  ____________________________________________   PHYSICAL EXAM:  VITAL SIGNS: ED Triage Vitals  Enc Vitals Group     BP      Pulse      Resp      Temp      Temp src      SpO2      Weight      Height      Head Circumference      Peak Flow      Pain Score      Pain Loc      Pain Edu?      Excl. in GC?     Constitutional: Alert and oriented.  Eyes: Conjunctivae are normal.  Head: Atraumatic. Nose: No congestion/rhinnorhea. Mouth/Throat: Mucous membranes are moist.  Oropharynx non-erythematous. Right molar appears decayed with surrounding gums mildly erythematous. Dentition is poor overall. Neck: No stridor.  Hematological/Lymphatic/Immunilogical: No cervical lymphadenopathy. Cardiovascular: Normal rate, regular rhythm. Grossly normal heart sounds.  Good peripheral circulation. Respiratory: Normal respiratory effort.  No retractions. Lungs CTAB. Gastrointestinal: Soft and nontender. No distention. Musculoskeletal: No lower extremity tenderness nor edema.  No joint effusions. Neurologic:  Normal speech and language. No gross focal neurologic deficits are appreciated. No gait instability. Skin:  Skin is warm, dry and intact. No rash noted. No Osler nodes or Janeway lesions. Psychiatric: Mood is depressed. Patient is tearful multiple times during examinations. Emotionally labile.    ____________________________________________   LABS (all labs ordered are listed, but only abnormal results are displayed)  Labs Reviewed - No data to display ____________________________________________  EKG   ____________________________________________  RADIOLOGY   ____________________________________________   PROCEDURES  Procedure(s) performed: None  Procedures  Critical Care performed: No  ____________________________________________   INITIAL IMPRESSION / ASSESSMENT AND PLAN / ED COURSE  Pertinent labs & imaging results that were available during my care of the patient were reviewed by me and considered in my medical decision making (see chart for details).  Dental pain secondary to devitalize dentures. Patient given discharge care instructions. Patient referred to the walk-in dental clinic next week. Patient is also given a dental clinics in case she did not meet that appointment. Patient started on tramadol, ibuprofen, and amoxicillin.  Clinical Course     ____________________________________________   FINAL CLINICAL IMPRESSION(S) / ED DIAGNOSES  Final diagnoses:  Pain, dental      NEW MEDICATIONS STARTED DURING THIS VISIT:  New Prescriptions   AMOXICILLIN (AMOXIL) 500 MG CAPSULE    Take 1 capsule (500 mg total) by mouth 3 (three) times daily.   IBUPROFEN (ADVIL,MOTRIN) 600 MG TABLET    Take 1 tablet (600 mg total) by mouth every 8 (eight) hours as needed.   TRAMADOL (  ULTRAM) 50 MG TABLET    Take 1 tablet (50 mg total) by mouth every 6 (six) hours as needed.     Note:  This document was prepared using Dragon voice recognition software and may include unintentional dictation errors.    Joni Reining, PA-C 02/11/16 1853    Willy Eddy, MD 02/11/16 [id number]

## 2016-02-11 NOTE — ED Triage Notes (Signed)
Tooth pain for about 1 year  Pain increased over the past few days

## 2016-11-28 ENCOUNTER — Encounter: Payer: Self-pay | Admitting: Emergency Medicine

## 2016-11-28 ENCOUNTER — Emergency Department
Admission: EM | Admit: 2016-11-28 | Discharge: 2016-11-28 | Disposition: A | Discharge status: Home / Self Care | Payer: Self-pay | Attending: Emergency Medicine | Admitting: Emergency Medicine

## 2016-11-28 DIAGNOSIS — Z79899 Other long term (current) drug therapy: Secondary | ICD-10-CM | POA: Insufficient documentation

## 2016-11-28 DIAGNOSIS — L0291 Cutaneous abscess, unspecified: Secondary | ICD-10-CM

## 2016-11-28 DIAGNOSIS — L02412 Cutaneous abscess of left axilla: Secondary | ICD-10-CM | POA: Insufficient documentation

## 2016-11-28 DIAGNOSIS — Z87891 Personal history of nicotine dependence: Secondary | ICD-10-CM | POA: Insufficient documentation

## 2016-11-28 MED ORDER — OXYCODONE-ACETAMINOPHEN 5-325 MG PO TABS
ORAL_TABLET | ORAL | Status: AC
  Administered 2016-11-28: 1 via ORAL
  Filled 2016-11-28: qty 1

## 2016-11-28 MED ORDER — LIDOCAINE HCL (PF) 1 % IJ SOLN
5.0000 mL | Freq: Once | INTRAMUSCULAR | Status: AC
  Administered 2016-11-28: 5 mL
  Filled 2016-11-28: qty 5

## 2016-11-28 MED ORDER — SULFAMETHOXAZOLE-TRIMETHOPRIM 800-160 MG PO TABS: 1.0000 | ORAL_TABLET | Freq: Two times a day (BID) | ORAL | 0 refills | Status: DC

## 2016-11-28 MED ORDER — PENTAFLUOROPROP-TETRAFLUOROETH EX AERO
INHALATION_SPRAY | CUTANEOUS | Status: DC | PRN
  Filled 2016-11-28 (×2): qty 30

## 2016-11-28 MED ORDER — OXYCODONE-ACETAMINOPHEN 5-325 MG PO TABS
1.0000 | ORAL_TABLET | Freq: Once | ORAL | Status: AC
  Administered 2016-11-28: 1 via ORAL

## 2016-11-28 NOTE — ED Triage Notes (Signed)
Abscess L axilla x 1 week, not draining.

## 2016-11-28 NOTE — ED Provider Notes (Signed)
[facility] Department Provider Note   ____________________________________________   I have reviewed the triage vital signs and the nursing notes.   HISTORY  Chief Complaint Abscess    HPI Cheryl Fitzgerald is a 21 y.o. female patient presented with a wound area along the left axilla along with multiple pimple-like areas of likely folliculitis and bilateral axilla. Patient reported one week history of the main and wound with worsening and size, erythema, and pain. Patient denies history of similar symptoms in the past. Patient endorses intermittent fevers associated with wound area of the left axilla. Patient denies chills, headache, vision changes, chest pain, chest tightness, shortness of breath, abdominal pain, nausea and vomiting.  Past Medical History:  Diagnosis Date  . Adjustment disorder with mixed emotional features 03/23/2015  . Depression    no medication  . Shortness of breath dyspnea     Patient Active Problem List   Diagnosis Date Noted  . Acquired hyperbilirubinemia   . Choledocholithiasis   . Non-specific filling defect of common bile duct   . Calculous cholecystitis with obstruction 08/08/2015  . 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  . Post-dates pregnancy 04/05/2015  . Adjustment disorder with mixed emotional features 03/23/2015  . Depression complicating pregnancy, antepartum 03/22/2015  . Supervision of high risk pregnancy due to social problems in third trimester 03/22/2015  . 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 Inadequate housing 03/22/2015  . Food hunger 03/22/2015  . Decreased fetal movement determined by examination 03/06/2015    Past Surgical History:  Procedure Laterality Date  . CHOLECYSTECTOMY N/A 08/08/2015   Procedure: LAPAROSCOPIC CHOLECYSTECTOMY WITH INTRAOPERATIVE CHOLANGIOGRAM;  Surgeon: Leafy Ro, MD;  Location: [facility] ORS;  Service: General;  Laterality: N/A;  .  ENDOSCOPIC RETROGRADE CHOLANGIOPANCREATOGRAPHY (ERCP) WITH PROPOFOL N/A 08/09/2015   Procedure: ENDOSCOPIC RETROGRADE CHOLANGIOPANCREATOGRAPHY (ERCP) WITH PROPOFOL;  Surgeon: Midge Minium, MD;  Location: [facility] ENDOSCOPY;  Service: Endoscopy;  Laterality: N/A;  . NO PAST SURGERIES    . VAGINAL DELIVERY  04/07/2015    Prior to Admission medications   Medication Sig Start Date End Date Taking? Authorizing Provider  acetaminophen (TYLENOL) 500 MG tablet Take 500-1,000 mg by mouth every 6 (six) hours as needed for mild pain or headache.     [provider]  amoxicillin (AMOXIL) 500 MG capsule Take 1 capsule (500 mg total) by mouth 3 (three) times daily. 02/11/16   Joni Reining, PA-C  fluconazole (DIFLUCAN) 150 MG tablet Take 1 tablet (150 mg total) by mouth once. 12/26/15   Emily Filbert, MD  ibuprofen (ADVIL,MOTRIN) 600 MG tablet Take 1 tablet (600 mg total) by mouth every 8 (eight) hours as needed. 02/11/16   Joni Reining, PA-C  ibuprofen (ADVIL,MOTRIN) 800 MG tablet Take 1 tablet (800 mg total) by mouth every 8 (eight) hours as needed. 12/11/15   Emily Filbert, MD  medroxyPROGESTERone (PROVERA) 10 MG tablet Take 1 tablet (10 mg total) by mouth daily. 12/11/15   Emily Filbert, MD  Multiple Vitamin (MULTIVITAMIN WITH MINERALS) TABS tablet Take 1 tablet by mouth daily.    [provider]  ondansetron (ZOFRAN) 4 MG tablet Take 1 tablet (4 mg total) by mouth daily as needed for nausea or vomiting. 11/02/15   Jeanmarie Plant, MD  sulfamethoxazole-trimethoprim (BACTRIM DS,SEPTRA DS) 800-160 MG tablet Take 1 tablet by mouth 2 (two) times daily. 11/28/16   Jj Enyeart M, PA-C  traMADol (ULTRAM) 50 MG tablet Take 1 tablet (50 mg  total) by mouth every 6 (six) hours as needed. 02/11/16 02/10/17  Joni ReiningSmith, Ronald K, PA-C    Allergies Patient has no known allergies.  No family history on file.  Social History Social History  Substance Use Topics  . Smoking status: Former Games developermoker    . Smokeless tobacco: Never Used  . Alcohol use No    Review of Systems Constitutional: Positive for fever/chills Eyes: No visual changes. ENT:  Negative for sore throat and for difficulty swallowing Cardiovascular: Denies chest pain. Respiratory: Denies cough Denies shortness of breath. Gastrointestinal: No abdominal pain.  No nausea, vomiting, diarrhea. Musculoskeletal: Negative for back pain. Negative for generalized body aches. Skin: Negative for rash. Positive for bilateral foot cellulitis in the axilla. Left axilla abscess. Neurological: Negative for headaches.  ____________________________________________   PHYSICAL EXAM:  VITAL SIGNS: ED Triage Vitals  Enc Vitals Group     BP 11/28/16 1518 126/70     Pulse Rate 11/28/16 1518 90     Resp 11/28/16 1518 18     Temp 11/28/16 1518 98.1 F (36.7 C)     Temp Source 11/28/16 1518 Oral     SpO2 11/28/16 1518 100 %     Weight 11/28/16 1519 187 lb (84.8 kg)     Height 11/28/16 1519 5\' 5"  (1.651 m)     Head Circumference --      Peak Flow --      Pain Score 11/28/16 1518 10     Pain Loc --      Pain Edu? --      Excl. in GC? --     Constitutional: Alert and oriented. Well appearing and in no acute distress.  Head: Normocephalic and atraumatic. Eyes: Conjunctivae are normal. PERRL. Mouth/Throat: Mucous membranes are moist. Neck: Supple Hematological/Lymphatic/Immunological: No cervical lymphadenopathy. Cardiovascular: Normal rate, regular rhythm. Normal distal pulses. Respiratory: Normal respiratory effort.  Musculoskeletal: Nontender with normal range of motion in all extremities. Neurologic: Normal speech and language.  Skin:  Skin is warm, dry and intact. No rash noted. Folliculitis bilateral axilla. Left axilla abscess with induration approximately 2 x 2 cm, erythema 3 x 3 cm in area of fluctuance 0.5 x 0.5 cm.  Psychiatric: Mood and affect are normal.  ____________________________________________   LABS (all  labs ordered are listed, but only abnormal results are displayed)  Labs Reviewed - No data to display ____________________________________________  EKG None ____________________________________________  RADIOLOGY None ____________________________________________   PROCEDURES  Procedure(s) performed: INCISION AND DRAINAGE Performed by: Clois Comberraci M Jamyson Jirak Consent: Verbal consent obtained. Risks and benefits: risks, benefits and alternatives were discussed Type: abscess  Body area: Left axilla  Anesthesia: local infiltration  Incision was made with a scalpel.  Local anesthetic: lidocaine 1%   Anesthetic total: 5.0 ml  Complexity: complex Blunt dissection to break up loculations  Drainage: purulent  Drainage amount: 4.0 ml  Packing material: 1/4 in iodoform gauze  Patient tolerance: Patient tolerated the procedure well with no immediate complications.     Critical Care performed: no ____________________________________________   INITIAL IMPRESSION / ASSESSMENT AND PLAN / ED COURSE  Pertinent labs & imaging results that were available during my care of the patient were reviewed by me and considered in my medical decision making (see chart for details).  Patient presented with small abscess along the left axilla approximately 3 x 3 cm of erythema and 0.5 x 0.5 cm of fluctuance. Patient tolerated I&D of the abscess without complications. Patient will be prescribed Bactrim for interbody coverage and instructed  to keep the area clean and dry and covered with a nonocclusive dressing. Patient instructed to return in 2 days for a wound recheck either at her primary care or to the emergency department. Patient informed of clinical course, understand medical decision-making process, and agree with plan. Patient instructed to monitor the area for signs of worsening infection and to return to the emergency department if noted.      ____________________________________   FINAL CLINICAL IMPRESSION(S) / ED DIAGNOSES  Final diagnoses:  Abscess       NEW MEDICATIONS STARTED DURING THIS VISIT:  New Prescriptions   SULFAMETHOXAZOLE-TRIMETHOPRIM (BACTRIM DS,SEPTRA DS) 800-160 MG TABLET    Take 1 tablet by mouth 2 (two) times daily.     Note:  This document was prepared using Dragon voice recognition software and may include unintentional dictation errors.    Percell Boston 11/28/16 1725    Emily Filbert, MD 11/28/16 Rickey Primus

## 2016-11-29 ENCOUNTER — Inpatient Hospital Stay
Admission: EM | Admit: 2016-11-29 | Discharge: 2016-12-01 | DRG: 603 | Disposition: A | Discharge status: Home / Self Care | Payer: Self-pay | Attending: Internal Medicine | Admitting: Internal Medicine

## 2016-11-29 ENCOUNTER — Encounter: Payer: Self-pay | Admitting: Emergency Medicine

## 2016-11-29 DIAGNOSIS — L02412 Cutaneous abscess of left axilla: Secondary | ICD-10-CM | POA: Diagnosis present

## 2016-11-29 DIAGNOSIS — L03114 Cellulitis of left upper limb: Principal | ICD-10-CM

## 2016-11-29 DIAGNOSIS — E876 Hypokalemia: Secondary | ICD-10-CM | POA: Diagnosis present

## 2016-11-29 DIAGNOSIS — L03112 Cellulitis of left axilla: Secondary | ICD-10-CM | POA: Diagnosis present

## 2016-11-29 DIAGNOSIS — L732 Hidradenitis suppurativa: Secondary | ICD-10-CM | POA: Diagnosis present

## 2016-11-29 DIAGNOSIS — F419 Anxiety disorder, unspecified: Secondary | ICD-10-CM | POA: Diagnosis present

## 2016-11-29 DIAGNOSIS — Z87891 Personal history of nicotine dependence: Secondary | ICD-10-CM

## 2016-11-29 LAB — CBC WITH DIFFERENTIAL/PLATELET
BASOS ABS: 0 10*3/uL (ref 0–0.1)
BASOS PCT: 0 %
EOS ABS: 0.1 10*3/uL (ref 0–0.7)
EOS PCT: 0 %
HCT: 41.7 % (ref 35.0–47.0)
HEMOGLOBIN: 14.4 g/dL (ref 12.0–16.0)
LYMPHS ABS: 1.9 10*3/uL (ref 1.0–3.6)
Lymphocytes Relative: 15 %
MCH: 28.1 pg (ref 26.0–34.0)
MCHC: 34.6 g/dL (ref 32.0–36.0)
MCV: 81.3 fL (ref 80.0–100.0)
Monocytes Absolute: 1 10*3/uL — ABNORMAL HIGH (ref 0.2–0.9)
Monocytes Relative: 8 %
NEUTROS PCT: 77 %
Neutro Abs: 9.8 10*3/uL — ABNORMAL HIGH (ref 1.4–6.5)
PLATELETS: 227 10*3/uL (ref 150–440)
RBC: 5.13 MIL/uL (ref 3.80–5.20)
RDW: 12.8 % (ref 11.5–14.5)
WBC: 12.8 10*3/uL — AB (ref 3.6–11.0)

## 2016-11-29 LAB — BASIC METABOLIC PANEL
ANION GAP: 6 (ref 5–15)
BUN: 7 mg/dL (ref 6–20)
CHLORIDE: 108 mmol/L (ref 101–111)
CO2: 22 mmol/L (ref 22–32)
Calcium: 8 mg/dL — ABNORMAL LOW (ref 8.9–10.3)
Creatinine, Ser: 0.48 mg/dL (ref 0.44–1.00)
Glucose, Bld: 94 mg/dL (ref 65–99)
POTASSIUM: 2.9 mmol/L — AB (ref 3.5–5.1)
SODIUM: 136 mmol/L (ref 135–145)

## 2016-11-29 LAB — HCG, QUANTITATIVE, PREGNANCY

## 2016-11-29 MED ORDER — POTASSIUM CHLORIDE CRYS ER 20 MEQ PO TBCR
40.0000 meq | EXTENDED_RELEASE_TABLET | Freq: Once | ORAL | Status: AC
  Administered 2016-11-29: 40 meq via ORAL
  Filled 2016-11-29: qty 2

## 2016-11-29 MED ORDER — HYDROCODONE-ACETAMINOPHEN 5-325 MG PO TABS
1.0000 | ORAL_TABLET | ORAL | Status: DC | PRN
  Administered 2016-11-29 – 2016-11-30 (×4): 2 via ORAL
  Administered 2016-12-01: 1 via ORAL
  Administered 2016-12-01: 2 via ORAL
  Filled 2016-11-29 (×3): qty 2
  Filled 2016-11-29: qty 1
  Filled 2016-11-29 (×3): qty 2

## 2016-11-29 MED ORDER — VANCOMYCIN HCL IN DEXTROSE 1-5 GM/200ML-% IV SOLN
1000.0000 mg | Freq: Once | INTRAVENOUS | Status: AC
Start: 2016-11-29 — End: 2016-11-29
  Administered 2016-11-29: 1000 mg via INTRAVENOUS
  Filled 2016-11-29: qty 200

## 2016-11-29 MED ORDER — MORPHINE SULFATE (PF) 2 MG/ML IV SOLN
2.0000 mg | Freq: Once | INTRAVENOUS | Status: AC
  Administered 2016-11-29: 2 mg via INTRAVENOUS
  Filled 2016-11-29: qty 1

## 2016-11-29 MED ORDER — KETOROLAC TROMETHAMINE 15 MG/ML IJ SOLN
15.0000 mg | Freq: Four times a day (QID) | INTRAMUSCULAR | Status: DC | PRN
  Administered 2016-11-29 – 2016-11-30 (×2): 15 mg via INTRAVENOUS
  Filled 2016-11-29 (×3): qty 1

## 2016-11-29 MED ORDER — ONDANSETRON HCL 4 MG/2ML IJ SOLN
4.0000 mg | Freq: Four times a day (QID) | INTRAMUSCULAR | Status: DC | PRN
  Administered 2016-11-30 – 2016-12-01 (×2): 4 mg via INTRAVENOUS
  Filled 2016-11-29 (×2): qty 2

## 2016-11-29 MED ORDER — ENOXAPARIN SODIUM 40 MG/0.4ML ~~LOC~~ SOLN
40.0000 mg | SUBCUTANEOUS | Status: DC
  Administered 2016-11-29 – 2016-11-30 (×2): 40 mg via SUBCUTANEOUS
  Filled 2016-11-29 (×2): qty 0.4

## 2016-11-29 MED ORDER — ACETAMINOPHEN 650 MG RE SUPP: 650.0000 mg | Freq: Four times a day (QID) | RECTAL | Status: DC | PRN

## 2016-11-29 MED ORDER — MORPHINE SULFATE (PF) 4 MG/ML IV SOLN
4.0000 mg | Freq: Once | INTRAVENOUS | Status: AC
  Administered 2016-11-29: 4 mg via INTRAVENOUS
  Filled 2016-11-29: qty 1

## 2016-11-29 MED ORDER — ONDANSETRON HCL 4 MG/2ML IJ SOLN
4.0000 mg | Freq: Once | INTRAMUSCULAR | Status: AC
  Administered 2016-11-29: 4 mg via INTRAVENOUS
  Filled 2016-11-29: qty 2

## 2016-11-29 MED ORDER — IBUPROFEN 400 MG PO TABS
ORAL_TABLET | ORAL | Status: AC
  Filled 2016-11-29: qty 2

## 2016-11-29 MED ORDER — ONDANSETRON HCL 4 MG PO TABS: 4.0000 mg | ORAL_TABLET | Freq: Four times a day (QID) | ORAL | Status: DC | PRN

## 2016-11-29 MED ORDER — VANCOMYCIN HCL IN DEXTROSE 1-5 GM/200ML-% IV SOLN
1000.0000 mg | Freq: Three times a day (TID) | INTRAVENOUS | Status: DC
  Administered 2016-11-29 – 2016-11-30 (×4): 1000 mg via INTRAVENOUS
  Filled 2016-11-29 (×5): qty 200

## 2016-11-29 MED ORDER — ACETAMINOPHEN 325 MG PO TABS: 650.0000 mg | ORAL_TABLET | Freq: Four times a day (QID) | ORAL | Status: DC | PRN

## 2016-11-29 MED ORDER — IBUPROFEN 800 MG PO TABS
800.0000 mg | ORAL_TABLET | Freq: Once | ORAL | Status: AC
  Administered 2016-11-29: 800 mg via ORAL

## 2016-11-29 MED ORDER — SULFAMETHOXAZOLE-TRIMETHOPRIM 800-160 MG PO TABS
1.0000 | ORAL_TABLET | Freq: Once | ORAL | Status: AC
  Administered 2016-11-29: 1 via ORAL
  Filled 2016-11-29: qty 1

## 2016-11-29 MED ORDER — PRENATAL PLUS 27-1 MG PO TABS
1.0000 | ORAL_TABLET | Freq: Every day | ORAL | Status: DC
  Administered 2016-11-30 – 2016-12-01 (×2): 1 via ORAL
  Filled 2016-11-29 (×2): qty 1

## 2016-11-29 MED ORDER — ZOLPIDEM TARTRATE 5 MG PO TABS
5.0000 mg | ORAL_TABLET | Freq: Every evening | ORAL | Status: DC | PRN
  Administered 2016-11-29 – 2016-11-30 (×2): 5 mg via ORAL
  Filled 2016-11-29 (×2): qty 1

## 2016-11-29 MED ORDER — OXYCODONE-ACETAMINOPHEN 5-325 MG PO TABS: 2.0000 | ORAL_TABLET | Freq: Once | ORAL | Status: DC

## 2016-11-29 NOTE — ED Triage Notes (Signed)
Pt was at [facility] picking up her rx for abscess she was seen for yesterday and that her "arms were vibrating and she felt her eyes roll up in her head and thought she was going to pass out". Pt states the pain is too much and they only gave her a percocet for the pain yesterday. Pt hyperventilating on arrival.

## 2016-11-29 NOTE — ED Notes (Signed)
Wrapped IV site. 

## 2016-11-29 NOTE — Consult Note (Signed)
Pharmacy Antibiotic Note  Cheryl RubensteinKarin L Fitzgerald is a 21 y.o. female admitted on 11/29/2016 with wound infection.  Pharmacy has been consulted for vancomycin dosing.  Plan: Vancomycin 1 g was given in ED. Will give next dose in 6 hours for stacked dosing Vancomycin 1000mg  IV every 8 hours.  Goal trough 15-20 mcg/mL.  Trough prior to the 5th total dose  Height: 5\' 5"  (165.1 cm) Weight: 187 lb (84.8 kg) IBW/kg (Calculated) : 57  Temp (24hrs), Avg:98.5 F (36.9 C), Min:98.4 F (36.9 C), Max:98.6 F (37 C)   Recent Labs Lab 11/29/16 1456 11/29/16 1608  WBC 12.8*  --   CREATININE  --  0.48    Estimated Creatinine Clearance: 118.6 mL/min (by C-G formula based on SCr of 0.48 mg/dL).    No Known Allergies  Antimicrobials this admission: Bactrim 6/24 >> one dose vancomycin 6/24 >>   Dose adjustments this admission:   Microbiology results: 6/24 wound:  Thank you for allowing pharmacy to be a part of this patient's care.  Olene FlossMelissa D Sarahi Borland, Pharm.D, BCPS Clinical Pharmacist  11/29/2016 6:54 PM

## 2016-11-29 NOTE — ED Notes (Signed)
Gave patient a food tray 

## 2016-11-29 NOTE — H&P (Signed)
[facility] - [place] at [facility]   PATIENT NAME: Cheryl Fitzgerald    MR#:  [id number]  DATE OF BIRTH:  1996/01/06  DATE OF ADMISSION:  11/29/2016  PRIMARY CARE PHYSICIAN: Patient, No Pcp Per   REQUESTING/REFERRING PHYSICIAN: Dr Shaune Pollack  CHIEF COMPLAINT:  Left arm and axillary pain.  HISTORY OF PRESENT ILLNESS:  Cheryl Fitzgerald  is a 21 y.o. female with a known history of Depression who comes to the emergency room with pain and increasing redness over the left axilla going to the arm. Patient was seen in the emergency room yesterday and underwent I and D of left axillary abscess. Packing was done patient was prescribed Bactrim which she went to pick up at [facility] this morning. She had a near syncopal episode and brought to the emergency room. She received a dose of IV vancomycin. She is hemodynamically stable. Patient is being admitted for left axillary cellulitis/Suppurativa Hidradenitis  PAST MEDICAL HISTORY:   Past Medical History:  Diagnosis Date  . Adjustment disorder with mixed emotional features 03/23/2015  . Depression    no medication  . Shortness of breath dyspnea     PAST SURGICAL HISTOIRY:   Past Surgical History:  Procedure Laterality Date  . CHOLECYSTECTOMY N/A 08/08/2015   Procedure: LAPAROSCOPIC CHOLECYSTECTOMY WITH INTRAOPERATIVE CHOLANGIOGRAM;  Surgeon: Leafy Ro, MD;  Location: [facility] ORS;  Service: General;  Laterality: N/A;  . ENDOSCOPIC RETROGRADE CHOLANGIOPANCREATOGRAPHY (ERCP) WITH PROPOFOL N/A 08/09/2015   Procedure: ENDOSCOPIC RETROGRADE CHOLANGIOPANCREATOGRAPHY (ERCP) WITH PROPOFOL;  Surgeon: Midge Minium, MD;  Location: [facility] ENDOSCOPY;  Service: Endoscopy;  Laterality: N/A;  . NO PAST SURGERIES    . VAGINAL DELIVERY  04/07/2015    SOCIAL HISTORY:   Social History  Substance Use Topics  . Smoking status: Former Games developer  . Smokeless tobacco: Never Used  . Alcohol use No    FAMILY HISTORY:  History reviewed. No pertinent family  history.  DRUG ALLERGIES:  No Known Allergies  REVIEW OF SYSTEMS:  Review of Systems  Constitutional: Negative for chills, fever and weight loss.  HENT: Negative for ear discharge, ear pain and nosebleeds.   Eyes: Negative for blurred vision, pain and discharge.  Respiratory: Negative for sputum production, shortness of breath, wheezing and stridor.   Cardiovascular: Negative for chest pain, palpitations, orthopnea and PND.  Gastrointestinal: Negative for abdominal pain, diarrhea, nausea and vomiting.  Genitourinary: Negative for frequency and urgency.  Musculoskeletal: Negative for back pain and joint pain.  Skin:       Left axillary/upper arm  Neurological: Positive for weakness. Negative for sensory change, speech change and focal weakness.  Psychiatric/Behavioral: Negative for depression and hallucinations. The patient is not nervous/anxious.      MEDICATIONS AT HOME:   Prior to Admission medications   Medication Sig Start Date End Date Taking? Authorizing Provider  acetaminophen (TYLENOL) 500 MG tablet Take 500-1,000 mg by mouth every 6 (six) hours as needed for mild pain or headache.    Yes [provider]  prenatal vitamin w/FE, FA (PRENATAL 1 + 1) 27-1 MG TABS tablet Take 1 tablet by mouth daily at 12 noon.   Yes [provider]  medroxyPROGESTERone (PROVERA) 10 MG tablet Take 1 tablet (10 mg total) by mouth daily. Patient not taking: Reported on 11/29/2016 12/11/15   Emily Filbert, MD  sulfamethoxazole-trimethoprim (BACTRIM DS,SEPTRA DS) 800-160 MG tablet Take 1 tablet by mouth 2 (two) times daily. Patient not taking: Reported on 11/29/2016 11/28/16   Little, Jordan Likes,  PA-C      VITAL SIGNS:  Height 5\' 5"  (1.651 m), weight 84.8 kg (187 lb), last menstrual period 11/10/2016, not currently breastfeeding.  PHYSICAL EXAMINATION:  GENERAL:  21 y.o.-year-old patient lying in the bed with no acute distress.  EYES: Pupils equal, round, reactive to light and  accommodation. No scleral icterus. Extraocular muscles intact.  HEENT: Head atraumatic, normocephalic. Oropharynx and nasopharynx clear.  NECK:  Supple, no jugular venous distention. No thyroid enlargement, no tenderness.  LUNGS: Normal breath sounds bilaterally, no wheezing, rales,rhonchi or crepitation. No use of accessory muscles of respiration.  CARDIOVASCULAR: S1, S2 normal. No murmurs, rubs, or gallops.  ABDOMEN: Soft, nontender, nondistended. Bowel sounds present. No organomegaly or mass.  EXTREMITIES: No pedal edema, cyanosis, or clubbing. Patient has multiple papular folliculitis in both axillae, IND status post packing left axilla and erythema left upper extremity NEUROLOGIC: Cranial nerves II through XII are intact. Muscle strength 5/5 in all extremities. Sensation intact. Gait not checked.  PSYCHIATRIC: The patient is alert and oriented x 3.  SKIN: No obvious ulcer.   LABORATORY PANEL:   CBC  Recent Labs Lab 11/29/16 1456  WBC 12.8*  HGB 14.4  HCT 41.7  PLT 227   ------------------------------------------------------------------------------------------------------------------  Chemistries  No results for input(s): NA, K, CL, CO2, GLUCOSE, BUN, CREATININE, CALCIUM, MG, AST, ALT, ALKPHOS, BILITOT in the last 168 hours.  Invalid input(s): GFRCGP ------------------------------------------------------------------------------------------------------------------  Cardiac Enzymes No results for input(s): TROPONINI in the last 168 hours. ------------------------------------------------------------------------------------------------------------------  RADIOLOGY:  No results found.  EKG:    IMPRESSION AND PLAN:   Cheryl Fitzgerald  is a 21 y.o. female with a known history of Depression who comes to the emergency room with pain and increasing redness over the left axilla going to the arm. Patient was seen in the emergency room yesterday and underwent I and D of left axillary  abscess  1. Left axillary cellulitis/suppurativa Hidradenitis - Patient is status post IND of left axillary abscess on 11/28/2016 in the ER -Wound culture was not sent -IV vancomycin----changed to oral Bactrim at discharge (patient already has a prescription) -IV and oral pain meds -Surgical consultation. Case discussed with Dr. Excell Seltzerooper   2. Leukocytosis due to 1  3. Anxiety  4. DVT prophylaxis subcutaneous Lovenox  Patient was seen in the emergency room by Dr. Excell Seltzerooper. Packing was removed. She does not need any more packing. It looks superficial wound. Patient should be able to discharge tomorrow on oral Bactrim and oral pain meds. She is quite anxious in the emergency room and hence admitting her for overnight observation.  All the records are reviewed and case discussed with ED provider. Management plans discussed with the patient, family and they are in agreement.  CODE STATUS: Full  TOTAL TIME TAKING CARE OF THIS PATIENT: *40 minutes.    Cheryl Fitzgerald M.D on 11/29/2016 at 4:11 PM  Between 7am to 6pm - Pager - [phone]  After 6pm go to [email or web address] - password [email or web address] [facility]  [facility]  Office  [phone]  CC: Primary care physician; Patient, No Pcp Per

## 2016-11-29 NOTE — ED Notes (Signed)
Changed patient bandage on arm.

## 2016-11-29 NOTE — Consult Note (Signed)
Surgical Consultation  11/29/2016  Cheryl Fitzgerald is an 21 y.o. female.   CC: Left axillary pain  HPI: I was asked see this patient by Dr. Vonzell Schlatter from [facility] who asked me to see her for left axillary pain. She was in the ED yesterday and had drainage of a left axillary boil and packing was placed. She returns today with ongoing pain but no fevers or chills states her pain is diffuse in the axilla. She also has something similar in the right axilla. It is not infected.  Past Medical History:  Diagnosis Date  . Adjustment disorder with mixed emotional features 03/23/2015  . Depression    no medication  . Shortness of breath dyspnea     Past Surgical History:  Procedure Laterality Date  . CHOLECYSTECTOMY N/A 08/08/2015   Procedure: LAPAROSCOPIC CHOLECYSTECTOMY WITH INTRAOPERATIVE CHOLANGIOGRAM;  Surgeon: Leafy Ro, MD;  Location: [facility] ORS;  Service: General;  Laterality: N/A;  . ENDOSCOPIC RETROGRADE CHOLANGIOPANCREATOGRAPHY (ERCP) WITH PROPOFOL N/A 08/09/2015   Procedure: ENDOSCOPIC RETROGRADE CHOLANGIOPANCREATOGRAPHY (ERCP) WITH PROPOFOL;  Surgeon: Midge Minium, MD;  Location: [facility] ENDOSCOPY;  Service: Endoscopy;  Laterality: N/A;  . NO PAST SURGERIES    . VAGINAL DELIVERY  04/07/2015    History reviewed. No pertinent family history.  Social History:  reports that she has quit smoking. She has never used smokeless tobacco. She reports that she uses drugs, including Marijuana. She reports that she does not drink alcohol.  Allergies: No Known Allergies  Medications reviewed.   Review of Systems:   Review of Systems  Constitutional: Negative for chills and fever.  HENT: Negative.   Eyes: Negative.   Respiratory: Negative.   Cardiovascular: Negative.   Gastrointestinal: Negative.   Genitourinary: Negative.   Musculoskeletal: Negative.   Skin:       Boils  Neurological: Negative.   Endo/Heme/Allergies: Negative.   Psychiatric/Behavioral: Positive for depression. The  patient is nervous/anxious.      Physical Exam:  Ht 5\' 5"  (1.651 m)   Wt 187 lb (84.8 kg)   LMP 11/10/2016   BMI 31.12 kg/m   Physical Exam  Constitutional: She is oriented to person, place, and time and well-developed, well-nourished, and in no distress. No distress.  Patient appears super anxious. When I first walked up and said hello she jumped hysterically up from the bed.  Eyes: Right eye exhibits no discharge. Left eye exhibits no discharge. No scleral icterus.  Neck: Normal range of motion.  Pulmonary/Chest: Effort normal. No respiratory distress.  Musculoskeletal: Normal range of motion. She exhibits tenderness. She exhibits no edema.  Minimal left axillary erythema with no edema there is a approximately 8 mm wound in the left axilla. Packing is removed no purulence is noted the orifice of this wound is cultured. No packing is  replaced  Lymphadenopathy:    She has no cervical adenopathy.  Neurological: She is alert and oriented to person, place, and time.  Skin: Skin is warm. She is not diaphoretic. There is erythema.  Tattoos on her face  Vitals reviewed.     Results for orders placed or performed during the hospital encounter of 11/29/16 (from the past 48 hour(s))  CBC with Differential     Status: Abnormal   Collection Time: 11/29/16  2:56 PM  Result Value Ref Range   WBC 12.8 (H) 3.6 - 11.0 K/uL   RBC 5.13 3.80 - 5.20 MIL/uL   Hemoglobin 14.4 12.0 - 16.0 g/dL   HCT 40.9 81.1 -  47.0 %   MCV 81.3 80.0 - 100.0 fL   MCH 28.1 26.0 - 34.0 pg   MCHC 34.6 32.0 - 36.0 g/dL   RDW 16.112.8 09.611.5 - 04.514.5 %   Platelets 227 150 - 440 K/uL   Neutrophils Relative % 77 %   Neutro Abs 9.8 (H) 1.4 - 6.5 K/uL   Lymphocytes Relative 15 %   Lymphs Abs 1.9 1.0 - 3.6 K/uL   Monocytes Relative 8 %   Monocytes Absolute 1.0 (H) 0.2 - 0.9 K/uL   Eosinophils Relative 0 %   Eosinophils Absolute 0.1 0 - 0.7 K/uL   Basophils Relative 0 %   Basophils Absolute 0.0 0 - 0.1 K/uL   No results  found.  Assessment/Plan:  This patient had an I&D yesterday of a left axillary abscess her packing is removed and while I had not seen it yesterday it appears to be resolving. Cultures are obtained but there is no purulence. It is very shallow and I would not recommend replacing the packing. I discussed with Dr. Allena KatzPatel and I she saw her with Dr. Allena KatzPatel whereby we discussed admission for IV antibiotics with expected discharge tomorrow.  Lattie Hawichard E Yahmir Sokolov, MD, FACS

## 2016-11-29 NOTE — ED Notes (Signed)
This RN and 2nd RN attempted to obtain blood for ordered Lab Work. Unable to obtain at this time. This RN will attempt to obtain once pt has ordered meds and is able to relax.

## 2016-11-29 NOTE — ED Provider Notes (Signed)
[facility] Emergency Department Provider Note ____________________________________________   I have reviewed the triage vital signs and the triage nursing note.  HISTORY  Chief Complaint Abscess   Historian Patient  HPI Cheryl Fitzgerald is a 21 y.o. female presents for worsening severe and uncontrolled pain at the right axilla where she had an abscess incised and drained yesterday evening around 5:30 PM. States that she had Percocet tablet and was really up all night due to severe pain. The abscess was incised and also packed. There is been a small amount of drainage from the area. The amount of redness around the skin has increased. She has not picked up antibiotic yet. She was prescribed to start Bactrim. Reports nausea and feeling lightheaded. Denies fever.    Past Medical History:  Diagnosis Date  . Adjustment disorder with mixed emotional features 03/23/2015  . Depression    no medication  . Shortness of breath dyspnea     Patient Active Problem List   Diagnosis Date Noted  . Acquired hyperbilirubinemia   . Choledocholithiasis   . Non-specific filling defect of common bile duct   . Calculous cholecystitis with obstruction 08/08/2015  . 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  . Post-dates pregnancy 04/05/2015  . Adjustment disorder with mixed emotional features 03/23/2015  . Depression complicating pregnancy, antepartum 03/22/2015  . Supervision of high risk pregnancy due to social problems in third trimester 03/22/2015  . 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 Inadequate housing 03/22/2015  . Food hunger 03/22/2015  . Decreased fetal movement determined by examination 03/06/2015    Past Surgical History:  Procedure Laterality Date  . CHOLECYSTECTOMY N/A 08/08/2015   Procedure: LAPAROSCOPIC CHOLECYSTECTOMY WITH INTRAOPERATIVE CHOLANGIOGRAM;  Surgeon: Leafy Ro, MD;  Location: [facility] ORS;  Service: General;  Laterality:  N/A;  . ENDOSCOPIC RETROGRADE CHOLANGIOPANCREATOGRAPHY (ERCP) WITH PROPOFOL N/A 08/09/2015   Procedure: ENDOSCOPIC RETROGRADE CHOLANGIOPANCREATOGRAPHY (ERCP) WITH PROPOFOL;  Surgeon: Midge Minium, MD;  Location: [facility] ENDOSCOPY;  Service: Endoscopy;  Laterality: N/A;  . NO PAST SURGERIES    . VAGINAL DELIVERY  04/07/2015    Prior to Admission medications   Medication Sig Start Date End Date Taking? Authorizing Provider  acetaminophen (TYLENOL) 500 MG tablet Take 500-1,000 mg by mouth every 6 (six) hours as needed for mild pain or headache.    Yes [provider]  ibuprofen (ADVIL,MOTRIN) 600 MG tablet Take 1 tablet (600 mg total) by mouth every 8 (eight) hours as needed. Patient not taking: Reported on 11/29/2016 02/11/16   Joni Reining, PA-C  ibuprofen (ADVIL,MOTRIN) 800 MG tablet Take 1 tablet (800 mg total) by mouth every 8 (eight) hours as needed. Patient not taking: Reported on 11/29/2016 12/11/15   Emily Filbert, MD  medroxyPROGESTERone (PROVERA) 10 MG tablet Take 1 tablet (10 mg total) by mouth daily. 12/11/15   Emily Filbert, MD  Multiple Vitamin (MULTIVITAMIN WITH MINERALS) TABS tablet Take 1 tablet by mouth daily.    [provider]  ondansetron (ZOFRAN) 4 MG tablet Take 1 tablet (4 mg total) by mouth daily as needed for nausea or vomiting. Patient not taking: Reported on 11/29/2016 11/02/15   Jeanmarie Plant, MD  sulfamethoxazole-trimethoprim (BACTRIM DS,SEPTRA DS) 800-160 MG tablet Take 1 tablet by mouth 2 (two) times daily. 11/28/16   Little, Traci M, PA-C  traMADol (ULTRAM) 50 MG tablet Take 1 tablet (50 mg total) by mouth every 6 (six) hours as needed. Patient not taking: Reported on 11/29/2016 02/11/16 02/10/17  Nona Dell  K, PA-C    No Known Allergies  History reviewed. No pertinent family history.  Social History Social History  Substance Use Topics  . Smoking status: Former Games developermoker  . Smokeless tobacco: Never Used  . Alcohol use No    Review of  Systems  Constitutional: Negative for fever. Eyes: Negative for visual changes. ENT: Negative for sore throat. Cardiovascular: Negative for chest pain. Respiratory: Negative for shortness of breath. Gastrointestinal: Positive for nausea. Genitourinary: Negative for dysuria. Musculoskeletal: Negative for back pain. Skin: Redness and swelling along the underside of her left arm. Neurological: Negative for headache.  ____________________________________________   PHYSICAL EXAM:  VITAL SIGNS: ED Triage Vitals  Enc Vitals Group     BP --      Pulse --      Resp --      Temp --      Temp src --      SpO2 --      Weight 11/29/16 1154 187 lb (84.8 kg)     Height 11/29/16 1154 5\' 5"  (1.651 m)     Head Circumference --      Peak Flow --      Pain Score 11/29/16 1152 10     Pain Loc --      Pain Edu? --      Excl. in GC? --      Constitutional: Alert and oriented. Tearful and states she is in severe pain. HEENT   Head: Normocephalic and atraumatic.      Eyes: Conjunctivae are normal. Pupils equal and round.       Ears:         Nose: No congestion/rhinnorhea.   Mouth/Throat: Mucous membranes are moist.   Neck: No stridor. Cardiovascular/Chest: Normal rate, regular rhythm.  No murmurs, rubs, or gallops. Respiratory: Normal respiratory effort without tachypnea nor retractions. Breath sounds are clear and equal bilaterally. No wheezes/rales/rhonchi. Gastrointestinal: Soft. No distention, no guarding, no rebound. Nontender.    Genitourinary/rectal:Deferred Musculoskeletal: Axilla has an area that has been incised and has packing intact with a small amount of dried blood, and pus.  Palm sized area of cellulitis surrounding moving towards the elbow area. Neurologic:  Normal speech and language. No gross or focal neurologic deficits are appreciated. Skin:  Cellulitis rash left arm. Psychiatric: Mood and affect are normal. Speech and behavior are normal. Patient exhibits  appropriate insight and judgment.   ____________________________________________  LABS (pertinent positives/negatives)  Labs Reviewed  CBC WITH DIFFERENTIAL/PLATELET - Abnormal; Notable for the following:       Result Value   WBC 12.8 (*)    Neutro Abs 9.8 (*)    Monocytes Absolute 1.0 (*)    All other components within normal limits  CULTURE, BLOOD (ROUTINE X 2)  CULTURE, BLOOD (ROUTINE X 2)  BASIC METABOLIC PANEL    ____________________________________________    EKG I, Governor Rooksebecca Tighe Gitto, MD, the attending physician have personally viewed and interpreted all ECGs.  None ____________________________________________  RADIOLOGY All Xrays were viewed by me. Imaging interpreted by Radiologist.  None __________________________________________  PROCEDURES  Procedure(s) performed: None  Critical Care performed: None  ____________________________________________   ED COURSE / ASSESSMENT AND PLAN  Pertinent labs & imaging results that were available during my care of the patient were reviewed by me and considered in my medical decision making (see chart for details).   Ms. Elenor Legatoavone is in severe pain at the site where she had incision and drainage near the axilla, and there is a significant  amount of cellulitis at this point in time.  She is mostly concerned about the severe pain, and I'm going to go ahead and place an IV given the fact that she was feeling lightheaded and the cellulitis.  Patient continues to have significant pain at her arm in the area of redness and swelling concerning for cellulitis worsening despite Bactrim over the last 24 hours. I'm going to cover her with vancomycin and admitted to the hospital for significant limp cellulitis.  She does have an elevated white blood cell count. Patient was difficult IV stick, blood cultures were ordered, I'm not going to delay antibiotics and waiting for this however.   CONSULTATIONS:   Hospitalist for  admission.   ___________________________________________   FINAL CLINICAL IMPRESSION(S) / ED DIAGNOSES   Final diagnoses:  Cellulitis of left arm              Note: This dictation was prepared with Dragon dictation. Any transcriptional errors that result from this process are unintentional    Governor Rooks, MD 11/29/16 1530

## 2016-11-29 NOTE — ED Notes (Signed)
Gave patient chips.

## 2016-11-29 NOTE — ED Notes (Signed)
Gave patient water 

## 2016-11-30 LAB — BASIC METABOLIC PANEL
Anion gap: 5 (ref 5–15)
BUN: 8 mg/dL (ref 6–20)
CALCIUM: 9 mg/dL (ref 8.9–10.3)
CO2: 25 mmol/L (ref 22–32)
CREATININE: 0.45 mg/dL (ref 0.44–1.00)
Chloride: 104 mmol/L (ref 101–111)
GFR calc Af Amer: >60 mL/min (ref >60)
GFR calc non Af Amer: >60 mL/min (ref >60)
GLUCOSE: 99 mg/dL (ref 65–99)
Potassium: 4 mmol/L (ref 3.5–5.1)
Sodium: 134 mmol/L — ABNORMAL LOW (ref 135–145)

## 2016-11-30 LAB — MAGNESIUM: Magnesium: 1.9 mg/dL (ref 1.7–2.4)

## 2016-11-30 MED ORDER — POTASSIUM CHLORIDE 10 MEQ/100ML IV SOLN: 10.0000 meq | INTRAVENOUS | Status: DC

## 2016-11-30 MED ORDER — POTASSIUM CHLORIDE CRYS ER 20 MEQ PO TBCR: 40.0000 meq | EXTENDED_RELEASE_TABLET | Freq: Once | ORAL | Status: DC

## 2016-11-30 NOTE — Progress Notes (Signed)
Patient ID: Cheryl Fitzgerald, female   DOB: [birth year], 21 y.o.   MRN: [id number]  [facility] PROGRESS NOTE  [name]:[id number] DOB: [birth year] DOA: 11/29/2016 PCP: Patient, No Pcp Per  HPI/Subjective: Patient with a lot of throbbing in her left arm. Redness has extended outside the line that was drawn in the ER. Other than the arm pain she feels okay.  Objective: Vitals:   11/30/16 0605 11/30/16 1433  BP: (!) 93/53 112/62  Pulse: 80 81  Resp: 16 20  Temp: 98 F (36.7 C) 97.7 F (36.5 C)    Filed Weights   11/29/16 1154 11/29/16 1751  Weight: 84.8 kg (187 lb) 82 kg (180 lb 11.2 oz)    ROS: Review of Systems  Constitutional: Negative for chills and fever.  Eyes: Negative for blurred vision.  Respiratory: Negative for cough and shortness of breath.   Cardiovascular: Negative for chest pain.  Gastrointestinal: Negative for abdominal pain, constipation, diarrhea, nausea and vomiting.  Genitourinary: Negative for dysuria.  Musculoskeletal: Positive for joint pain and myalgias.  Neurological: Negative for dizziness and headaches.   Exam: Physical Exam  Constitutional: She is oriented to person, place, and time.  HENT:  Nose: No mucosal edema.  Mouth/Throat: No oropharyngeal exudate or posterior oropharyngeal edema.  Eyes: Conjunctivae, EOM and lids are normal. Pupils are equal, round, and reactive to light.  Neck: No JVD present. Carotid bruit is not present. No edema present. No thyroid mass and no thyromegaly present.  Cardiovascular: S1 normal and S2 normal.  Exam reveals no gallop.   No murmur heard. Pulses:      Dorsalis pedis pulses are 2+ on the right side, and 2+ on the left side.  Respiratory: No respiratory distress. She has no wheezes. She has no rhonchi. She has no rales.  GI: Soft. Bowel sounds are normal. There is no tenderness.  Musculoskeletal:       Right shoulder: She exhibits no swelling.  Lymphadenopathy:    She has no cervical  adenopathy.  Neurological: She is alert and oriented to person, place, and time. No cranial nerve deficit.  Skin: Skin is warm. Nails show no clubbing.  Erythema extending outside the outlying from yesterday. The area where it was lanced looks okay. And erythema has settled down there.  Psychiatric: She has a normal mood and affect.      Data Reviewed: Basic Metabolic Panel:  Recent Labs Lab 11/29/16 1608 11/30/16 0908  NA 136 134*  K 2.9* 4.0  CL 108 104  CO2 22 25  GLUCOSE 94 99  BUN 7 8  CREATININE 0.48 0.45  CALCIUM 8.0* 9.0  MG  --  1.9   CBC:  Recent Labs Lab 11/29/16 1456  WBC 12.8*  NEUTROABS 9.8*  HGB 14.4  HCT 41.7  MCV 81.3  PLT 227     Recent Results (from the past 240 hour(s))  Aerobic/Anaerobic Culture (surgical/deep wound)     Status: None (Preliminary result)   Collection Time: 11/29/16  4:08 PM  Result Value Ref Range Status   Specimen Description ABSCESS  Final   Special Requests LEFT AXILLA  Final   Gram Stain   Final    FEW WBC PRESENT, PREDOMINANTLY PMN FEW GRAM POSITIVE COCCI IN PAIRS    Culture   Final    FEW STAPHYLOCOCCUS AUREUS SUSCEPTIBILITIES TO FOLLOW Performed at [facility], [address], [place], [place] [address]    Report Status PENDING  Incomplete  Scheduled Meds: . enoxaparin (LOVENOX) injection  40 mg Subcutaneous Q24H  . prenatal vitamin w/FE, FA  1 tablet Oral Q1200   Continuous Infusions: . vancomycin Stopped (11/30/16 0837)    Assessment/Plan:  1. 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 Patient on IV vancomycin. Cultures growing out staph aureus. Potentially can go home on either doxycycline or Bactrim. 2. Hypokalemia this has been replaced  Code Status:     Code Status Orders        Start     Ordered   11/29/16 1806  Full code  Continuous     11/29/16 1805    Code Status History    Date Active Date Inactive Code Status Order ID Comments User Context    08/08/2015  7:48 PM 08/11/2015 12:47 AM Full Code [id number]  Leafy RoPabon, Diego F, MD Inpatient   08/07/2015  7:52 PM 08/08/2015  7:48 PM Full Code [id number]  Lattie Hawooper, Surah Pelley E, MD ED   04/07/2015 11:24 AM 04/09/2015  6:42 PM Full Code [id number]  Casey BurkittFitzgerald, Hillary Moen, MD Inpatient   04/06/2015  5:45 AM 04/07/2015 11:24 AM Full Code [id number]  Casey BurkittFitzgerald, Hillary Moen, MD Inpatient   04/05/2015  7:13 PM 04/06/2015  5:45 AM Full Code [id number]  [place] BingPickens, Charlie, MD Inpatient   03/22/2015  2:16 PM 03/22/2015  7:58 PM Full Code [id number]  Farrel ConnersGutierrez, Colleen, CNM Inpatient   03/06/2015  1:14 PM 03/06/2015  7:01 PM Full Code [id number]  Marta AntuBrothers, Tamara, CNM Inpatient   02/28/2015 10:33 PM 03/01/2015  2:16 AM Full Code [id number]  Berneta LevinsWagoner, Joy L, RN Inpatient   01/08/2015  5:05 PM 01/08/2015  8:19 PM Full Code [id number]  Nadara MustardHarris, Robert P, MD Inpatient   12/22/2014  2:12 PM 12/22/2014  7:41 PM Full Code [id number]  Conard NovakJackson, Stephen D, MD Inpatient   12/18/2014  2:23 AM 12/18/2014  5:33 AM Full Code [id number]  Marta AntuBrothers, Tamara, CNM Inpatient     Disposition Plan: Likely home tomorrow  Consultants:  General surgery  Antibiotics:  IV vancomycin  Time spent:  25 minutes  Alford HighlandWIETING, Jaaziah Schulke  [facility]

## 2016-11-30 NOTE — Progress Notes (Signed)
Called Dr. Renae GlossWieting to notify him that the potassium came back at 4 and magnesium came back at 1.9.  He gave me a verbal order to discontinue to IV and oral potassium orders.

## 2016-12-01 LAB — VANCOMYCIN, TROUGH: Vancomycin Tr: 10 ug/mL — ABNORMAL LOW (ref 15–20)

## 2016-12-01 LAB — POTASSIUM: Potassium: 3.7 mmol/L (ref 3.5–5.1)

## 2016-12-01 MED ORDER — POLYETHYLENE GLYCOL 3350 17 G PO PACK
17.0000 g | PACK | Freq: Every day | ORAL | Status: DC
  Administered 2016-12-01: 17 g via ORAL
  Filled 2016-12-01: qty 1

## 2016-12-01 MED ORDER — VANCOMYCIN HCL 10 G IV SOLR
1250.0000 mg | Freq: Three times a day (TID) | INTRAVENOUS | Status: DC
  Administered 2016-12-01: 1250 mg via INTRAVENOUS
  Filled 2016-12-01 (×4): qty 1250

## 2016-12-01 MED ORDER — HYDROCODONE-ACETAMINOPHEN 5-325 MG PO TABS: 1.0000 | ORAL_TABLET | Freq: Four times a day (QID) | ORAL | 0 refills | Status: DC | PRN

## 2016-12-01 NOTE — Consult Note (Signed)
Pharmacy Antibiotic Note  Cheryl Fitzgerald is a 21 y.o. female admitted on 11/29/2016 with wound infection.  Pharmacy has been consulted for vancomycin dosing.  Plan: Vancomycin 1 g was given in ED. Will give next dose in 6 hours for stacked dosing Vancomycin 1000mg  IV every 8 hours.  Goal trough 15-20 mcg/mL.  Trough prior to the 5th total dose  Height: 5\' 5"  (165.1 cm) Weight: 180 lb 11.2 oz (82 kg) IBW/kg (Calculated) : 57  Temp (24hrs), Avg:98.4 F (36.9 C), Min:97.7 F (36.5 C), Max:99.2 F (37.3 C)   Recent Labs Lab 11/29/16 1456 11/29/16 1608 11/30/16 0908 12/01/16 0630  WBC 12.8*  --   --   --   CREATININE  --  0.48 0.45  --   VANCOTROUGH  --   --   --  10*    Estimated Creatinine Clearance: 116.7 mL/min (by C-G formula based on SCr of 0.45 mg/dL).    No Known Allergies  Antimicrobials this admission: Bactrim 6/24 >> one dose vancomycin 6/24 >>   Dose adjustments this admission: 6/26 AM vanc level 10. Changed to 1250 units q 8 hours. Level before 4th new dose   Microbiology results: 6/24 wound:  Thank you for allowing pharmacy to be a part of this patient's care.  Olene FlossMelissa D Maccia, Pharm.D, BCPS Clinical Pharmacist  12/01/2016 7:05 AM

## 2016-12-01 NOTE — Discharge Summary (Signed)
[facility] - [place] at [facility]   PATIENT NAME: Cheryl Fitzgerald    MR#:  [id number]  DATE OF BIRTH:  07/22/1995  DATE OF ADMISSION:  11/29/2016 ADMITTING PHYSICIAN: Enedina FinnerSona Patel, MD  DATE OF DISCHARGE: 12/01/2016  PRIMARY CARE PHYSICIAN: Patient, No Pcp Per    ADMISSION DIAGNOSIS:  Cellulitis of left arm [L03.114]  DISCHARGE DIAGNOSIS:  Active Problems:   Axillary hidradenitis suppurativa   SECONDARY DIAGNOSIS:   Past Medical History:  Diagnosis Date  . Adjustment disorder with mixed emotional features 03/23/2015  . Depression    no medication  . Shortness of breath dyspnea     HOSPITAL COURSE:   1. Left axillary hidradenitis suppurativa with cellulitis of the left arm. Patient was started on IV vancomycin. MRSA growing out of the culture. It is sensitive to Bactrim that she was prescribed from the ER the other night. Can go back on this. Follow up with general surgery as outpatient. 2. Hypokalemia this was replaced during the hospital course  DISCHARGE CONDITIONS:   Satisfactory  CONSULTS OBTAINED:   general surgery  DRUG ALLERGIES:  No Known Allergies  DISCHARGE MEDICATIONS:   Current Discharge Medication List    START taking these medications   Details  HYDROcodone-acetaminophen (NORCO/VICODIN) 5-325 MG tablet Take 1 tablet by mouth every 6 (six) hours as needed for moderate pain. Qty: 10 tablet, Refills: 0      CONTINUE these medications which have NOT CHANGED   Details  acetaminophen (TYLENOL) 500 MG tablet Take 500-1,000 mg by mouth every 6 (six) hours as needed for mild pain or headache.     prenatal vitamin w/FE, FA (PRENATAL 1 + 1) 27-1 MG TABS tablet Take 1 tablet by mouth daily at 12 noon.    sulfamethoxazole-trimethoprim (BACTRIM DS,SEPTRA DS) 800-160 MG tablet Take 1 tablet by mouth 2 (two) times daily. Qty: 20 tablet, Refills: 0      STOP taking these medications     medroxyPROGESTERone (PROVERA) 10 MG tablet           DISCHARGE INSTRUCTIONS:   Follow-up with general surgery 1 week Patient will need to [place] and get a physician on her card to set up a primary care physician  If you experience worsening of your admission symptoms, develop shortness of breath, life threatening emergency, suicidal or homicidal thoughts you must seek medical attention immediately by calling 911 or calling your MD immediately  if symptoms less severe.  You Must read complete instructions/literature along with all the possible adverse reactions/side effects for all the Medicines you take and that have been prescribed to you. Take any new Medicines after you have completely understood and accept all the possible adverse reactions/side effects.   Please note  You were cared for by a hospitalist during your hospital stay. If you have any questions about your discharge medications or the care you received while you were in the hospital after you are discharged, you can call the unit and asked to speak with the hospitalist on call if the hospitalist that took care of you is not available. Once you are discharged, your primary care physician will handle any further medical issues. Please note that NO REFILLS for any discharge medications will be authorized once you are discharged, as it is imperative that you return to your primary care physician (or establish a relationship with a primary care physician if you do not have one) for your aftercare needs so that they can  reassess your need for medications and monitor your lab values.    Today   CHIEF COMPLAINT:   Chief Complaint  Patient presents with  . Abscess    HISTORY OF PRESENT ILLNESS:  Cheryl Fitzgerald  is a 21 y.o. female presented with abscess in her left axilla   VITAL SIGNS:  Blood pressure (!) 111/50, pulse 61, temperature 98.1 F (36.7 C), temperature source Oral, resp. rate 18, height 5\' 5"  (1.651 m), weight 82 kg (180 lb 11.2  oz), last menstrual period 11/10/2016, SpO2 100 %, not currently breastfeeding.   PHYSICAL EXAMINATION:  GENERAL:  21 y.o.-year-old patient lying in the bed with no acute distress.  EYES: Pupils equal, round, reactive to light and accommodation. No scleral icterus. Extraocular muscles intact.  HEENT: Head atraumatic, normocephalic. Oropharynx and nasopharynx clear.  NECK:  Supple, no jugular venous distention. No thyroid enlargement, no tenderness.  LUNGS: Normal breath sounds bilaterally, no wheezing, rales,rhonchi or crepitation. No use of accessory muscles of respiration.  CARDIOVASCULAR: S1, S2 normal. No murmurs, rubs, or gallops.  ABDOMEN: Soft, non-tender, non-distended. Bowel sounds present. No organomegaly or mass.  EXTREMITIES: No pedal edema, cyanosis, or clubbing.  NEUROLOGIC: Cranial nerves II through XII are intact. Muscle strength 5/5 in all extremities. Sensation intact. Gait not checked.  PSYCHIATRIC: The patient is alert and oriented x 3.  SKIN: No obvious rash, lesion, or ulcer.   DATA REVIEW:   CBC  Recent Labs Lab 11/29/16 1456  WBC 12.8*  HGB 14.4  HCT 41.7  PLT 227    Chemistries   Recent Labs Lab 11/30/16 0908 12/01/16 0630  NA 134*  --   K 4.0 3.7  CL 104  --   CO2 25  --   GLUCOSE 99  --   BUN 8  --   CREATININE 0.45  --   CALCIUM 9.0  --   MG 1.9  --      Microbiology Results  Results for orders placed or performed during the hospital encounter of 11/29/16  Aerobic/Anaerobic Culture (surgical/deep wound)     Status: None (Preliminary result)   Collection Time: 11/29/16  4:08 PM  Result Value Ref Range Status   Specimen Description ABSCESS  Final   Special Requests LEFT AXILLA  Final   Gram Stain   Final    FEW WBC PRESENT, PREDOMINANTLY PMN FEW GRAM POSITIVE COCCI IN PAIRS Performed at [facility], [address], [place], [place] [address]    Culture   Final    FEW METHICILLIN RESISTANT STAPHYLOCOCCUS AUREUS NO ANAEROBES  ISOLATED; CULTURE IN PROGRESS FOR 5 DAYS    Report Status PENDING  Incomplete   Organism ID, Bacteria METHICILLIN RESISTANT STAPHYLOCOCCUS AUREUS  Final      Susceptibility   Methicillin resistant staphylococcus aureus - MIC*    CIPROFLOXACIN >=8 RESISTANT Resistant     ERYTHROMYCIN >=8 RESISTANT Resistant     GENTAMICIN <=0.5 SENSITIVE Sensitive     OXACILLIN >=4 RESISTANT Resistant     TETRACYCLINE <=1 SENSITIVE Sensitive     VANCOMYCIN <=0.5 SENSITIVE Sensitive     TRIMETH/SULFA <=10 SENSITIVE Sensitive     CLINDAMYCIN <=0.25 SENSITIVE Sensitive     RIFAMPIN <=0.5 SENSITIVE Sensitive     Inducible Clindamycin NEGATIVE Sensitive     * FEW METHICILLIN RESISTANT STAPHYLOCOCCUS AUREUS       Management plans discussed with the patient, family and they are in agreement.  CODE STATUS:     Code Status Orders  Start     Ordered   11/29/16 1806  Full code  Continuous     11/29/16 1805    Code Status History    Date Active Date Inactive Code Status Order ID Comments User Context   08/08/2015  7:48 PM 08/11/2015 12:47 AM Full Code [id number]  Leafy Ro, MD Inpatient   08/07/2015  7:52 PM 08/08/2015  7:48 PM Full Code [id number]  Lattie Haw, MD ED   04/07/2015 11:24 AM 04/09/2015  6:42 PM Full Code [id number]  Casey Burkitt, MD Inpatient   04/06/2015  5:45 AM 04/07/2015 11:24 AM Full Code [id number]  Casey Burkitt, MD Inpatient   04/05/2015  7:13 PM 04/06/2015  5:45 AM Full Code [id number]  [place]er Bing, MD Inpatient   03/22/2015  2:16 PM 03/22/2015  7:58 PM Full Code [id number]  Farrel Conners, CNM Inpatient   03/06/2015  1:14 PM 03/06/2015  7:01 PM Full Code [id number]  Marta Antu, CNM Inpatient   02/28/2015 10:33 PM 03/01/2015  2:16 AM Full Code [id number]  Berneta Levins, RN Inpatient   01/08/2015  5:05 PM 01/08/2015  8:19 PM Full Code [id number]  Nadara Mustard, MD Inpatient   12/22/2014  2:12 PM 12/22/2014  7:41 PM Full Code [id number]  [name], MD Inpatient   12/18/2014  2:23 AM 12/18/2014  5:33 AM Full Code [id number]  Marta Antu, CNM Inpatient      TOTAL TIME TAKING CARE OF THIS PATIENT: 32 minutes.    Alford Highland M.D on 12/01/2016 at 3:55 PM  Between 7am to 6pm - Pager - [phone]  After 6pm go to [email or web address] - password [email or web address] [facility]  [facility] Office  [phone]  CC: Primary care physician; Patient, No Pcp Per

## 2016-12-01 NOTE — Progress Notes (Signed)
12/01/2016 16:45  Cheryl RubensteinKarin L Topping to be D/C'd Home per MD order.  Discussed prescriptions and follow up appointments with the patient. Prescriptions given to patient, medication list explained in detail. Pt verbalized understanding.    Vitals:   12/01/16 0512 12/01/16 1300  BP: (!) 101/46 (!) 111/50  Pulse: 61   Resp: 16 18  Temp: 98.3 F (36.8 C) 98.1 F (36.7 C)    Skin clean, dry and intact without evidence of skin break down, no evidence of skin tears noted. IV catheter discontinued intact. Site without signs and symptoms of complications. Dressing and pressure applied. Pt denies pain at this time. No complaints noted.  An After Visit Summary was printed and given to the patient. Patient escorted via WC, and D/C home via private auto.  Bradly Chrisougherty, Loralie Malta E

## 2016-12-01 NOTE — Care Management (Signed)
Patient admitted for cellulitis.  Patient self pay patient and does not have PCP.  [place] [facility] [place] [facility], however does not have Medicaid in [place].  Patient [facility] be [facility] with [facility] and [facility] prior to discharge.  Patient to discharge with rx for pain medication.  Patient to continue oral bactrim which she already has at home from ED discharge.  No further RNCM needs identified.

## 2016-12-04 LAB — AEROBIC/ANAEROBIC CULTURE (SURGICAL/DEEP WOUND)

## 2016-12-04 LAB — AEROBIC/ANAEROBIC CULTURE W GRAM STAIN (SURGICAL/DEEP WOUND)

## 2016-12-10 ENCOUNTER — Encounter: Payer: Self-pay | Admitting: General Surgery

## 2016-12-10 ENCOUNTER — Ambulatory Visit (INDEPENDENT_AMBULATORY_CARE_PROVIDER_SITE_OTHER): Payer: Self-pay | Admitting: General Surgery

## 2016-12-10 DIAGNOSIS — Z4889 Encounter for other specified surgical aftercare: Secondary | ICD-10-CM

## 2016-12-10 NOTE — Progress Notes (Signed)
Outpatient Surgical Follow Up  12/10/2016  Cheryl RubensteinKarin L Fitzgerald is an 21 y.o. female.   Chief Complaint  Patient presents with  . Follow-up    I&D left axillary abscess Dr Excell Seltzerooper 11/28/16    HPI: 21 year old female returns to clinic for follow-up after an I&D of the left axillary abscess. Patient reports she still has antibiotics to take but that the area has improved dramatically. It is still sore with motion and tender to the touch but the redness has completely gone away. She denies any fevers, chills, nausea, vomiting, chest pain, shortness of breath, diarrhea, constipation.  Past Medical History:  Diagnosis Date  . Adjustment disorder with mixed emotional features 03/23/2015  . Depression    no medication  . Shortness of breath dyspnea     Past Surgical History:  Procedure Laterality Date  . CHOLECYSTECTOMY N/A 08/08/2015   Procedure: LAPAROSCOPIC CHOLECYSTECTOMY WITH INTRAOPERATIVE CHOLANGIOGRAM;  Surgeon: Leafy Roiego F Pabon, MD;  Location: [facility] ORS;  Service: General;  Laterality: N/A;  . ENDOSCOPIC RETROGRADE CHOLANGIOPANCREATOGRAPHY (ERCP) WITH PROPOFOL N/A 08/09/2015   Procedure: ENDOSCOPIC RETROGRADE CHOLANGIOPANCREATOGRAPHY (ERCP) WITH PROPOFOL;  Surgeon: Midge Miniumarren Wohl, MD;  Location: [facility] ENDOSCOPY;  Service: Endoscopy;  Laterality: N/A;  . NO PAST SURGERIES    . VAGINAL DELIVERY  04/07/2015    Family History  Problem Relation Age of Onset  . Diabetes Mother   . Hyperlipidemia Mother   . Hypertension Mother   . Arthritis Father   . Depression Father   . Breast cancer Maternal Grandmother   . Diabetes Maternal Grandmother     Social History:  reports that she has quit smoking. She has never used smokeless tobacco. She reports that she uses drugs, including Marijuana. She reports that she does not drink alcohol.  Allergies: No Known Allergies  Medications reviewed.    ROS A multipoint review of systems was completed, all pertinent positives and negatives are documented  within the history of present illness and remainder are negative   LMP 12/10/2016   Physical Exam Gen.: No acute distress Chest: Clear to auscultation Heart: Regular rate and rhythm Abdomen: Soft nontender Skin: Left axillary abscess area with skin completely healed. No evidence of erythema, drainage, fluctuance. Tender to palpation at the site of prior I&D but no current drainage.    No results found for this or any previous visit (from the past 48 hour(s)). No results found.  Assessment/Plan:  1. Aftercare following surgery 21 year old female status post incision and drainage of left axillary abscess. Doing very well. Discussed that should the area continued to be painful and hard by Tuesday of next week she is to call to make a follow-up appointment for next week. However given the exam today is reassuring that it should be completely healed. She is to complete her antibiotics.     Cheryl Frameharles Esly Selvage, MD [facility] General Surgeon  12/10/2016,2:27 PM

## 2016-12-10 NOTE — Patient Instructions (Signed)
Please give us a call on Tuesday if you continue to have pain under your left axilla.  Please finish all of your antibiotics.  Continue doing what you are doing. Try to apply a warm compression to help with the inflammation.

## 2016-12-14 ENCOUNTER — Inpatient Hospital Stay: Payer: Self-pay | Admitting: Surgery

## 2016-12-22 IMAGING — CR DG CHEST 1V
1 series · 1 of 1 positions shown · non-contrast
Comparison: None.

CLINICAL DATA: Pregnant patient with positive PPD test.

EXAM:
CHEST  1 VIEW

[chest pa]
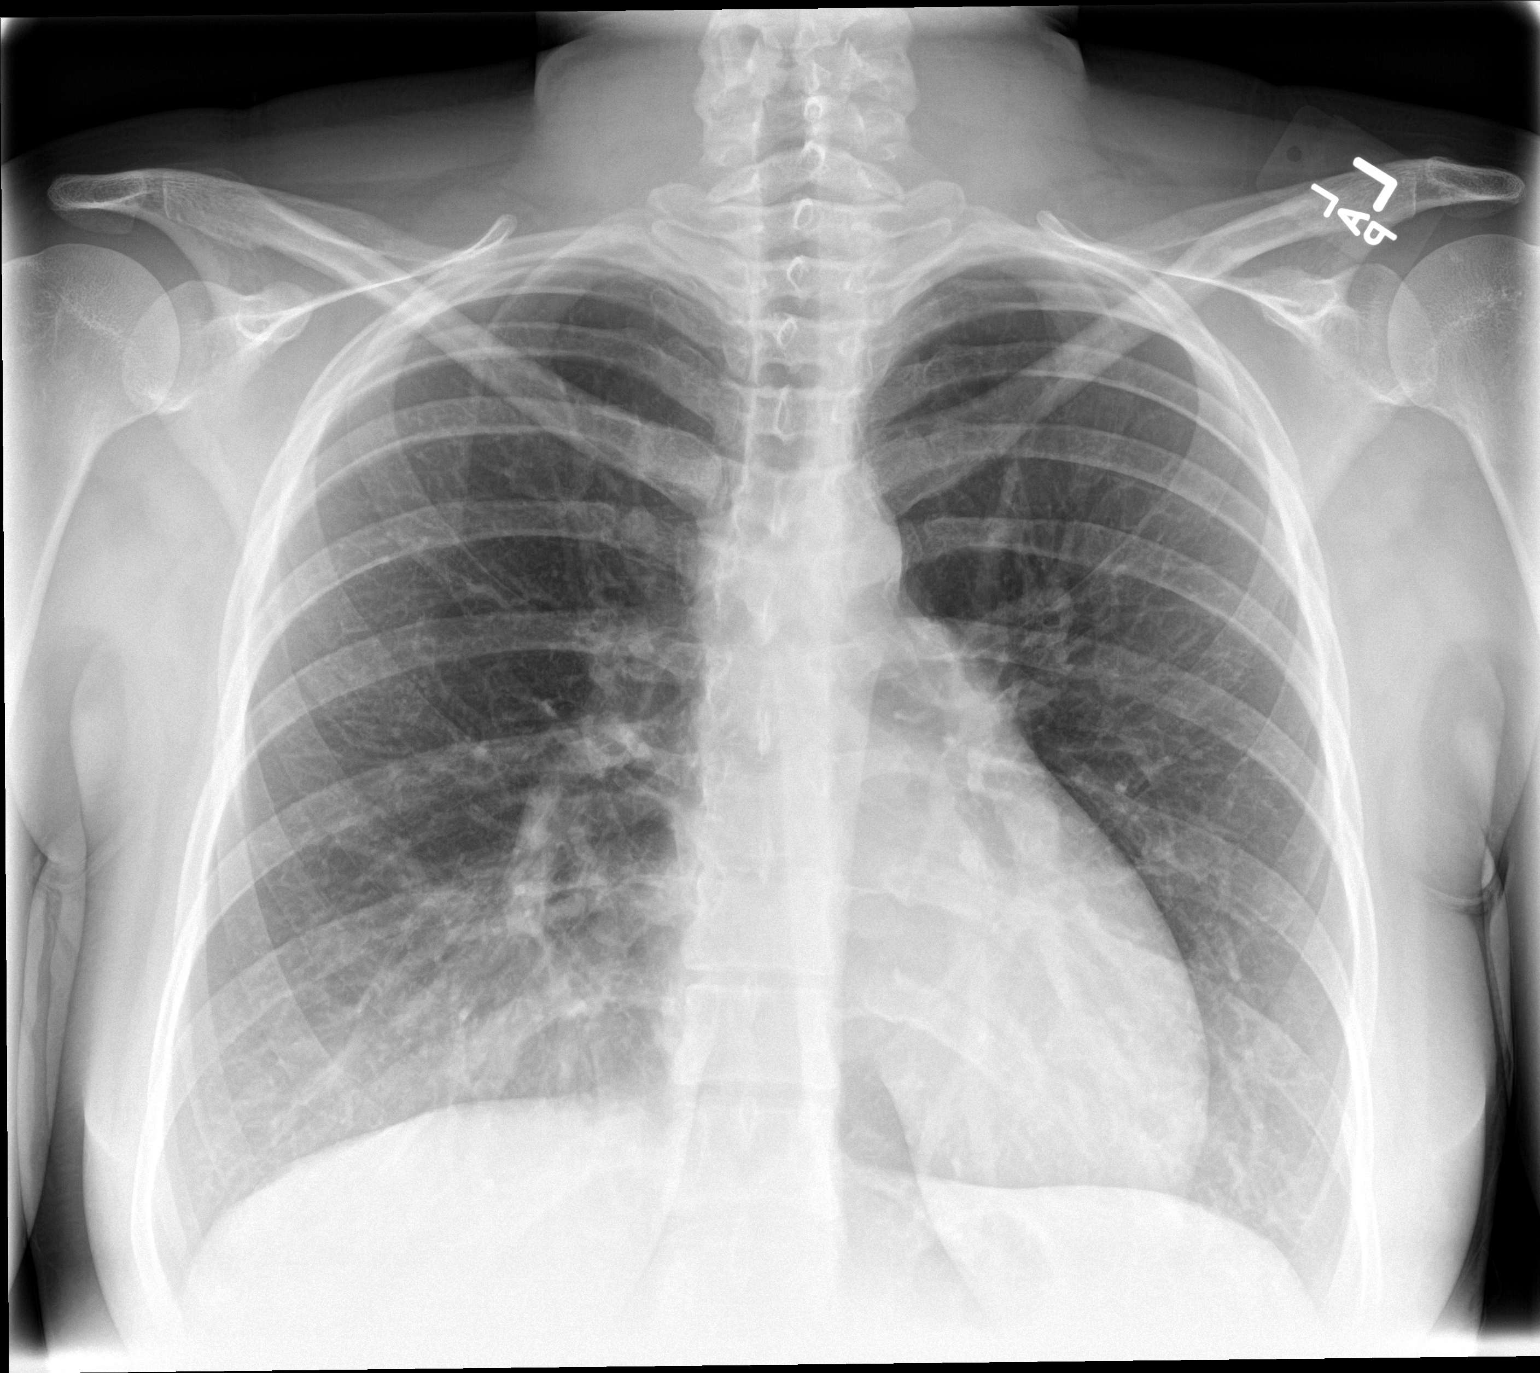

[1 of 1 positions shown; findings below may reference images not displayed]

FINDINGS: The heart size and mediastinal contours are within normal limits.
Both lungs are clear. The visualized skeletal structures are
unremarkable.
IMPRESSION: No active disease.

## 2017-03-31 IMAGING — CR DG CHEST 1V
1 series · 1 of 1 positions shown · non-contrast
Comparison: None.

CLINICAL DATA: Cough with sputum.  Patient is currently pregnant.

EXAM:
CHEST 1 VIEW

[ap]
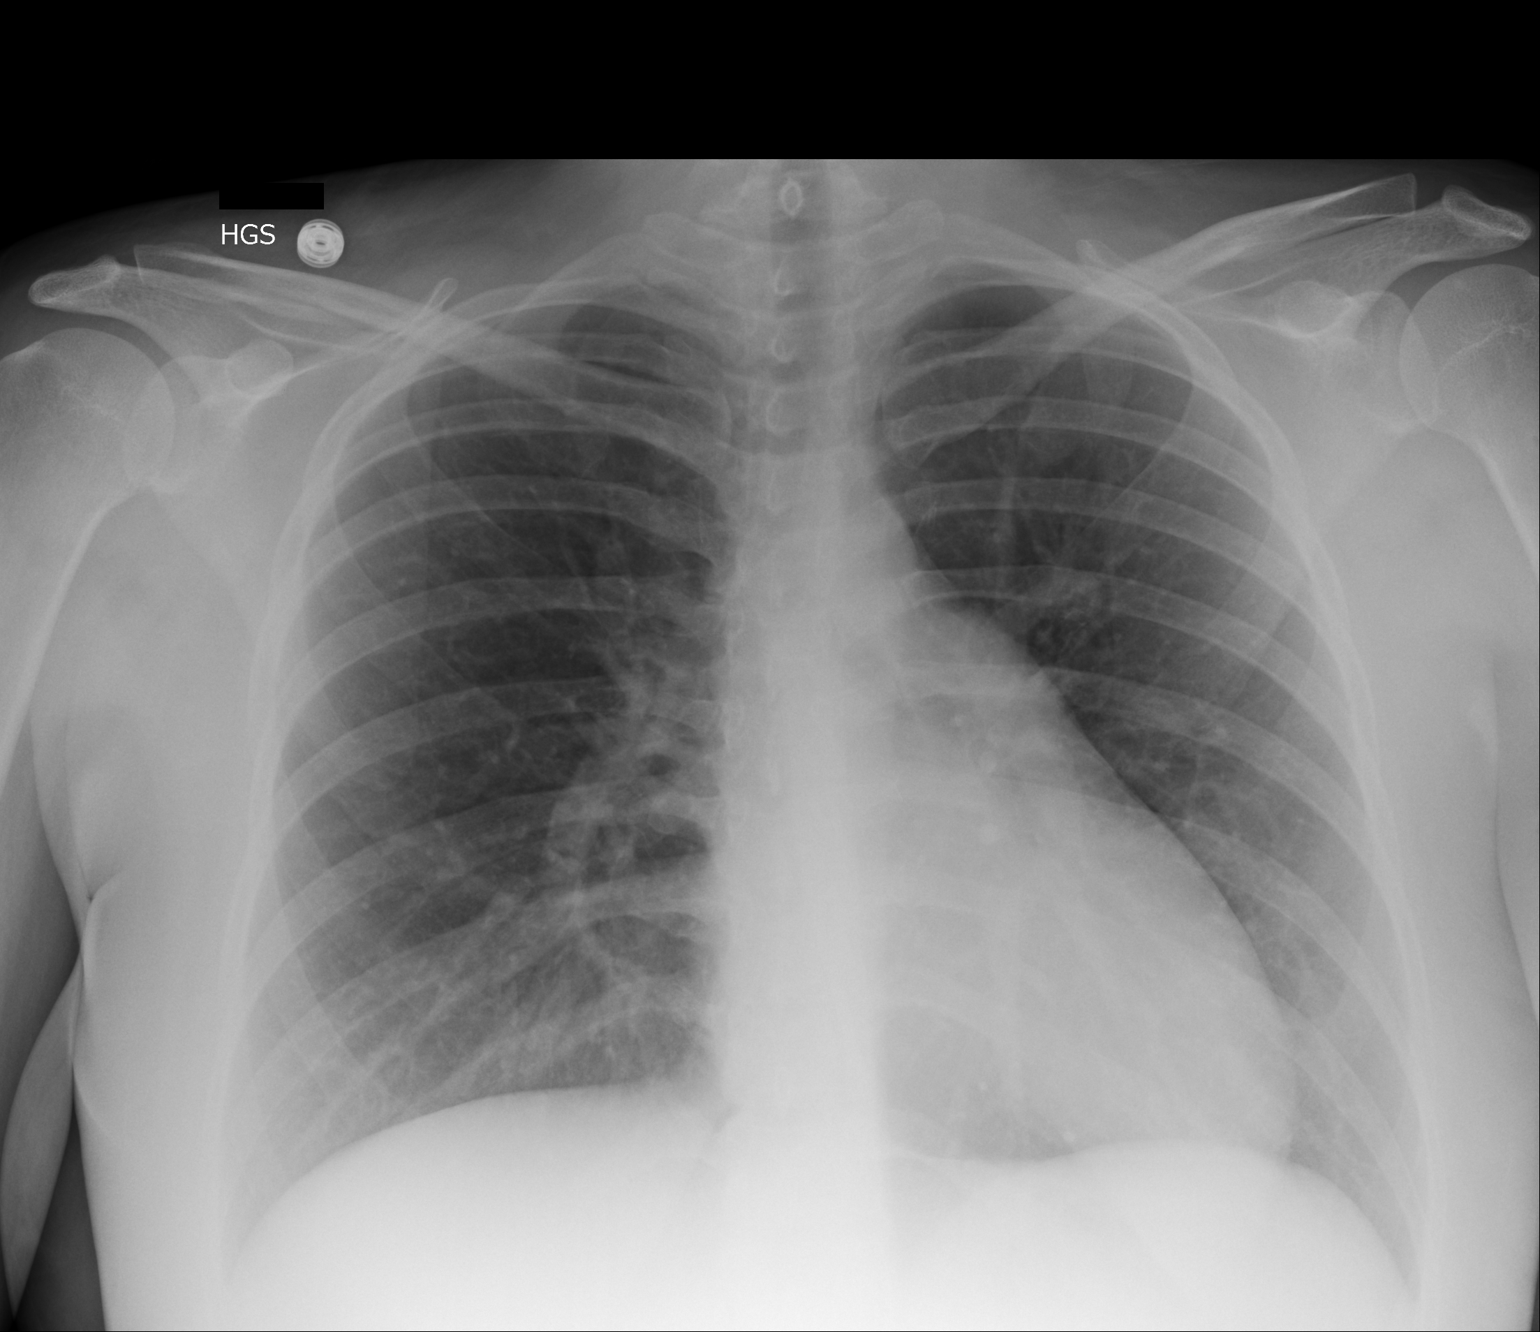

[1 of 1 positions shown; findings below may reference images not displayed]

FINDINGS: Borderline enlarged cardiac silhouette and mediastinal contours,
potentially accentuated due to obliquity. No focal airspace
opacities. No pleural effusion or pneumothorax. No evidence of
edema. No acute osseus abnormalities.
IMPRESSION: No acute cardiopulmonary disease. Specifically, no evidence of
pneumonia.

## 2017-07-12 ENCOUNTER — Emergency Department
Admission: EM | Admit: 2017-07-12 | Discharge: 2017-07-12 | Disposition: A | Discharge status: Home / Self Care | Payer: Self-pay | Attending: Emergency Medicine | Admitting: Emergency Medicine

## 2017-07-12 ENCOUNTER — Encounter: Payer: Self-pay | Admitting: Emergency Medicine

## 2017-07-12 ENCOUNTER — Other Ambulatory Visit: Payer: Self-pay

## 2017-07-12 DIAGNOSIS — Z79899 Other long term (current) drug therapy: Secondary | ICD-10-CM | POA: Insufficient documentation

## 2017-07-12 DIAGNOSIS — R1030 Lower abdominal pain, unspecified: Secondary | ICD-10-CM | POA: Insufficient documentation

## 2017-07-12 DIAGNOSIS — F17210 Nicotine dependence, cigarettes, uncomplicated: Secondary | ICD-10-CM | POA: Insufficient documentation

## 2017-07-12 LAB — COMPREHENSIVE METABOLIC PANEL
ALBUMIN: 4.5 g/dL (ref 3.5–5.0)
ALT: 37 U/L (ref 14–54)
AST: 26 U/L (ref 15–41)
Alkaline Phosphatase: 87 U/L (ref 38–126)
Anion gap: 5 (ref 5–15)
BILIRUBIN TOTAL: 0.3 mg/dL (ref 0.3–1.2)
BUN: 8 mg/dL (ref 6–20)
CHLORIDE: 102 mmol/L (ref 101–111)
CO2: 28 mmol/L (ref 22–32)
CREATININE: 0.63 mg/dL (ref 0.44–1.00)
Calcium: 9.3 mg/dL (ref 8.9–10.3)
GFR calc Af Amer: >60 mL/min (ref >60)
GFR calc non Af Amer: >60 mL/min (ref >60)
GLUCOSE: 94 mg/dL (ref 65–99)
POTASSIUM: 3.7 mmol/L (ref 3.5–5.1)
Sodium: 135 mmol/L (ref 135–145)
TOTAL PROTEIN: 7.9 g/dL (ref 6.5–8.1)

## 2017-07-12 LAB — WET PREP, GENITAL
CLUE CELLS WET PREP: NONE SEEN
Sperm: NONE SEEN
TRICH WET PREP: NONE SEEN
WBC WET PREP: NONE SEEN
YEAST WET PREP: NONE SEEN

## 2017-07-12 LAB — URINALYSIS, COMPLETE (UACMP) WITH MICROSCOPIC
BACTERIA UA: NONE SEEN
Bilirubin Urine: NEGATIVE
Glucose, UA: NEGATIVE mg/dL
Hgb urine dipstick: NEGATIVE
Ketones, ur: NEGATIVE mg/dL
Leukocytes, UA: NEGATIVE
Nitrite: NEGATIVE
Protein, ur: NEGATIVE mg/dL
SPECIFIC GRAVITY, URINE: 1.019 (ref 1.005–1.030)
pH: 8 (ref 5.0–8.0)

## 2017-07-12 LAB — POCT PREGNANCY, URINE: PREG TEST UR: NEGATIVE

## 2017-07-12 LAB — CHLAMYDIA/NGC RT PCR (ARMC ONLY)
Chlamydia Tr: NOT DETECTED
N GONORRHOEAE: NOT DETECTED

## 2017-07-12 LAB — CBC
HEMATOCRIT: 41.8 % (ref 35.0–47.0)
Hemoglobin: 14.1 g/dL (ref 12.0–16.0)
MCH: 28.3 pg (ref 26.0–34.0)
MCHC: 33.8 g/dL (ref 32.0–36.0)
MCV: 83.6 fL (ref 80.0–100.0)
PLATELETS: 294 10*3/uL (ref 150–440)
RBC: 5 MIL/uL (ref 3.80–5.20)
RDW: 12.8 % (ref 11.5–14.5)
WBC: 9.5 10*3/uL (ref 3.6–11.0)

## 2017-07-12 LAB — LIPASE, BLOOD: Lipase: 25 U/L (ref 11–51)

## 2017-07-12 MED ORDER — ONDANSETRON 4 MG PO TBDP
4.0000 mg | ORAL_TABLET | Freq: Once | ORAL | Status: AC
  Administered 2017-07-12: 4 mg via ORAL
  Filled 2017-07-12: qty 1

## 2017-07-12 MED ORDER — ONDANSETRON 4 MG PO TBDP: 4.0000 mg | ORAL_TABLET | Freq: Three times a day (TID) | ORAL | 0 refills | Status: DC | PRN

## 2017-07-12 NOTE — ED Notes (Signed)
Pt c/o of lower abd pain and N&V x 1 week. States fever at home. Has only taken nyquil. No gallbladder. Alert, oriented, no distress noted.

## 2017-07-12 NOTE — ED Notes (Signed)
Pt at nurses desk using phone, talking very loudly in hallway.

## 2017-07-12 NOTE — ED Provider Notes (Signed)
[facility] Emergency Department Provider Note  Time seen: 6:33 PM  I have reviewed the triage vital signs and the nursing notes.   HISTORY  Chief Complaint Abdominal Pain    HPI Cheryl Fitzgerald is a 22 y.o. female with a past medical history of depression, presents to the emergency department for lower abdominal discomfort.  According to the patient for the past 10 days she has been experiencing nasal congestion and cough, states she is not here for that she is here because over the past 1 week she has been experiencing lower abdominal pain and pain when she urinates.  Denies any cloudy urine or blood in her urine.  States her last period was approximately 2 months ago.  She does state mild to moderate clear vaginal discharge which is atypical per patient.  Patient states a fever approximately 1-1/2 weeks ago when she first started with cough and congestion but that has gone away.   Past Medical History:  Diagnosis Date  . Adjustment disorder with mixed emotional features 03/23/2015  . Depression    no medication  . Shortness of breath dyspnea     Patient Active Problem List   Diagnosis Date Noted  . 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 Cellulitis of left arm   . Acquired hyperbilirubinemia   . Choledocholithiasis   . Non-specific filling defect of common bile duct   . Calculous cholecystitis with obstruction 08/08/2015  . 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  . Post-dates pregnancy 04/05/2015  . Adjustment disorder with mixed emotional features 03/23/2015  . Depression complicating pregnancy, antepartum 03/22/2015  . Supervision of high risk pregnancy due to social problems in third trimester 03/22/2015  . 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 Inadequate housing 03/22/2015  . Food hunger 03/22/2015  . Decreased fetal movement determined by examination 03/06/2015    Past Surgical History:  Procedure Laterality  Date  . CHOLECYSTECTOMY N/A 08/08/2015   Procedure: LAPAROSCOPIC CHOLECYSTECTOMY WITH INTRAOPERATIVE CHOLANGIOGRAM;  Surgeon: Leafy Ro, MD;  Location: [facility] ORS;  Service: General;  Laterality: N/A;  . ENDOSCOPIC RETROGRADE CHOLANGIOPANCREATOGRAPHY (ERCP) WITH PROPOFOL N/A 08/09/2015   Procedure: ENDOSCOPIC RETROGRADE CHOLANGIOPANCREATOGRAPHY (ERCP) WITH PROPOFOL;  Surgeon: Midge Minium, MD;  Location: [facility] ENDOSCOPY;  Service: Endoscopy;  Laterality: N/A;  . NO PAST SURGERIES    . VAGINAL DELIVERY  04/07/2015    Prior to Admission medications   Medication Sig Start Date End Date Taking? Authorizing Provider  acetaminophen (TYLENOL) 500 MG tablet Take 500-1,000 mg by mouth every 6 (six) hours as needed for mild pain or headache.     [provider]  prenatal vitamin w/FE, FA (PRENATAL 1 + 1) 27-1 MG TABS tablet Take 1 tablet by mouth daily at 12 noon.    [provider]  sulfamethoxazole-trimethoprim (BACTRIM DS,SEPTRA DS) 800-160 MG tablet Take 1 tablet by mouth 2 (two) times daily. 11/28/16   Little, Traci M, PA-C    No Known Allergies  Family History  Problem Relation Age of Onset  . Diabetes Mother   . Hyperlipidemia Mother   . Hypertension Mother   . Arthritis Father   . Depression Father   . Breast cancer Maternal Grandmother   . Diabetes Maternal Grandmother     Social History Social History   Tobacco Use  . Smoking status: Current Every Day Smoker    Packs/day: 0.10    Types: Cigarettes  . Smokeless tobacco: Never Used  Substance Use Topics  . Alcohol use: No  .  Drug use: Yes    Types: Marijuana    Comment: patient states that she is unsure if her marijuana has been "laced". recent use 3 days ago    Review of Systems Constitutional: Fever 1-1/2 weeks ago with cough and congestion but no longer. Eyes: Negative for visual complaints ENT: Mild congestion. Cardiovascular: Negative for chest pain. Respiratory: Negative for shortness of  breath. Gastrointestinal: Patient states moderate lower abdominal pain, denies any nausea vomiting or diarrhea Genitourinary: States mild dysuria, last period was approximately 2 months ago.  States moderate clear vaginal discharge. Musculoskeletal: Negative for musculoskeletal complaints Skin: Negative for skin complaints  Neurological: Negative for headache All other ROS negative  ____________________________________________   PHYSICAL EXAM:  VITAL SIGNS: ED Triage Vitals  Enc Vitals Group     BP 07/12/17 1614 (!) 100/43     Pulse Rate 07/12/17 1614 95     Resp 07/12/17 1614 20     Temp 07/12/17 1614 98.5 F (36.9 C)     Temp Source 07/12/17 1614 Oral     SpO2 07/12/17 1614 99 %     Weight 07/12/17 1613 185 lb (83.9 kg)     Height 07/12/17 1613 5\' 5"  (1.651 m)     Head Circumference --      Peak Flow --      Pain Score --      Pain Loc --      Pain Edu? --      Excl. in GC? --    Constitutional: Alert and oriented. Well appearing and in no distress. Eyes: Normal exam ENT   Head: Normocephalic and atraumatic.   Nose: Slight rhinorrhea.   Mouth/Throat: Mucous membranes are moist.  No pharyngeal erythema or exudate. Cardiovascular: Normal rate, regular rhythm. No murmur Respiratory: Normal respiratory effort without tachypnea nor retractions. Breath sounds are clear  Gastrointestinal: Soft, nontender exam.  No rebound or guarding.  No distention.  When palpating abdomen patient does not react to any palpation however when asked where it hurts she points to the suprapubic region. Musculoskeletal: Nontender with normal range of motion in all extremities.  Neurologic:  Normal speech and language. No gross focal neurologic deficits  Skin:  Skin is warm, dry and intact.  Psychiatric: Mood and affect are normal.   ____________________________________________   INITIAL IMPRESSION / ASSESSMENT AND PLAN / ED COURSE  Pertinent labs & imaging results that were  available during my care of the patient were reviewed by me and considered in my medical decision making (see chart for details).  Patient presents emergency department complaints of lower abdominal pain times 1 week and pain when she urinates.  Differential would include urinary tract infection, colitis or diverticulitis, pelvic infection, STD.  Overall patient appears very well, no distress.  Normal vitals.  Patient had a completely nontender exam with no reaction to abdominal palpation when specifically asked if she is hurting anywhere she points to the suprapubic region, but again no reaction to me palpating this area.  Patient does state approximately 1 week of moderate clear vaginal discharge.  Patient's labs have resulted largely normal including a normal white blood cell count.  Normal chemistry, normal urinalysis, not pregnant.  We will perform a pelvic examination, send swabs.  Patient is asking for something to eat and drink.  Patient's wet prep is negative.  We will discharge with PCP follow-up.  ____________________________________________   FINAL CLINICAL IMPRESSION(S) / ED DIAGNOSES  Lower abdominal pain    Minna AntisPaduchowski, Joud Pettinato, MD 07/12/17  2000  

## 2017-07-12 NOTE — ED Notes (Signed)
Pt relations Alfredia ClientMary Jo at bedside

## 2017-07-12 NOTE — ED Triage Notes (Signed)
Abdominal pain x 2 weeks. Pain with urination.

## 2017-07-12 NOTE — ED Notes (Signed)
Pt discharged to lobby at this time. VSS. NAD. Discharge instructions, RX and follow up reviewed with patient. All questions answered.

## 2018-11-13 ENCOUNTER — Encounter: Payer: Self-pay | Admitting: *Deleted

## 2018-11-13 ENCOUNTER — Observation Stay: Payer: Self-pay

## 2018-11-13 ENCOUNTER — Observation Stay
Admission: EM | Admit: 2018-11-13 | Discharge: 2018-11-13 | Disposition: A | Discharge status: Home / Self Care | Payer: Self-pay | Attending: Obstetrics & Gynecology | Admitting: Obstetrics & Gynecology

## 2018-11-13 ENCOUNTER — Other Ambulatory Visit: Payer: Self-pay

## 2018-11-13 DIAGNOSIS — O26899 Other specified pregnancy related conditions, unspecified trimester: Secondary | ICD-10-CM | POA: Diagnosis present

## 2018-11-13 DIAGNOSIS — Z87891 Personal history of nicotine dependence: Secondary | ICD-10-CM | POA: Insufficient documentation

## 2018-11-13 DIAGNOSIS — F329 Major depressive disorder, single episode, unspecified: Secondary | ICD-10-CM | POA: Insufficient documentation

## 2018-11-13 DIAGNOSIS — F4320 Adjustment disorder, unspecified: Secondary | ICD-10-CM | POA: Insufficient documentation

## 2018-11-13 DIAGNOSIS — Z3A22 22 weeks gestation of pregnancy: Secondary | ICD-10-CM

## 2018-11-13 DIAGNOSIS — F32A Depression, unspecified: Secondary | ICD-10-CM | POA: Diagnosis present

## 2018-11-13 DIAGNOSIS — O368120 Decreased fetal movements, second trimester, not applicable or unspecified: Principal | ICD-10-CM | POA: Insufficient documentation

## 2018-11-13 DIAGNOSIS — Z79899 Other long term (current) drug therapy: Secondary | ICD-10-CM | POA: Insufficient documentation

## 2018-11-13 DIAGNOSIS — O99340 Other mental disorders complicating pregnancy, unspecified trimester: Secondary | ICD-10-CM

## 2018-11-13 DIAGNOSIS — R109 Unspecified abdominal pain: Secondary | ICD-10-CM | POA: Insufficient documentation

## 2018-11-13 DIAGNOSIS — O99342 Other mental disorders complicating pregnancy, second trimester: Secondary | ICD-10-CM | POA: Insufficient documentation

## 2018-11-13 DIAGNOSIS — O26892 Other specified pregnancy related conditions, second trimester: Secondary | ICD-10-CM | POA: Insufficient documentation

## 2018-11-13 DIAGNOSIS — Z1159 Encounter for screening for other viral diseases: Secondary | ICD-10-CM | POA: Insufficient documentation

## 2018-11-13 DIAGNOSIS — N926 Irregular menstruation, unspecified: Secondary | ICD-10-CM

## 2018-11-13 DIAGNOSIS — R1030 Lower abdominal pain, unspecified: Secondary | ICD-10-CM | POA: Diagnosis present

## 2018-11-13 DIAGNOSIS — F209 Schizophrenia, unspecified: Secondary | ICD-10-CM | POA: Insufficient documentation

## 2018-11-13 LAB — TYPE AND SCREEN
ABO/RH(D): A POS
Antibody Screen: NEGATIVE

## 2018-11-13 LAB — CBC
HCT: 38.9 % (ref 36.0–46.0)
Hemoglobin: 13 g/dL (ref 12.0–15.0)
MCH: 29.7 pg (ref 26.0–34.0)
MCHC: 33.4 g/dL (ref 30.0–36.0)
MCV: 89 fL (ref 80.0–100.0)
Platelets: 231 10*3/uL (ref 150–400)
RBC: 4.37 MIL/uL (ref 3.87–5.11)
RDW: 14 % (ref 11.5–15.5)
WBC: 10 10*3/uL (ref 4.0–10.5)
nRBC: 0 % (ref 0.0–0.2)

## 2018-11-13 LAB — URINE DRUG SCREEN, QUALITATIVE (ARMC ONLY)
Amphetamines, Ur Screen: NOT DETECTED
Barbiturates, Ur Screen: NOT DETECTED
Benzodiazepine, Ur Scrn: NOT DETECTED
Cannabinoid 50 Ng, Ur ~~LOC~~: NOT DETECTED
Cocaine Metabolite,Ur ~~LOC~~: NOT DETECTED
MDMA (Ecstasy)Ur Screen: NOT DETECTED
Methadone Scn, Ur: NOT DETECTED
Opiate, Ur Screen: NOT DETECTED
Phencyclidine (PCP) Ur S: NOT DETECTED
Tricyclic, Ur Screen: NOT DETECTED

## 2018-11-13 LAB — BASIC METABOLIC PANEL
Anion gap: 10 (ref 5–15)
BUN: 6 mg/dL (ref 6–20)
CO2: 19 mmol/L — ABNORMAL LOW (ref 22–32)
Calcium: 9.1 mg/dL (ref 8.9–10.3)
Chloride: 108 mmol/L (ref 98–111)
Creatinine, Ser: 0.4 mg/dL — ABNORMAL LOW (ref 0.44–1.00)
GFR calc Af Amer: >60 mL/min (ref >60)
GFR calc non Af Amer: >60 mL/min (ref >60)
Glucose, Bld: 89 mg/dL (ref 70–99)
Potassium: 3.5 mmol/L (ref 3.5–5.1)
Sodium: 137 mmol/L (ref 135–145)

## 2018-11-13 LAB — URINALYSIS, ROUTINE W REFLEX MICROSCOPIC
Bilirubin Urine: NEGATIVE
Glucose, UA: NEGATIVE mg/dL
Hgb urine dipstick: NEGATIVE
Ketones, ur: NEGATIVE mg/dL
Leukocytes,Ua: NEGATIVE
Nitrite: NEGATIVE
Protein, ur: NEGATIVE mg/dL
Specific Gravity, Urine: 1.015 (ref 1.005–1.030)
pH: 6 (ref 5.0–8.0)

## 2018-11-13 LAB — SARS CORONAVIRUS 2 BY RT PCR (HOSPITAL ORDER, PERFORMED IN ~~LOC~~ HOSPITAL LAB): SARS Coronavirus 2: NEGATIVE

## 2018-11-13 MED ORDER — ONDANSETRON HCL 4 MG/2ML IJ SOLN: 4.0000 mg | Freq: Four times a day (QID) | INTRAMUSCULAR | Status: DC | PRN

## 2018-11-13 MED ORDER — OXYTOCIN BOLUS FROM INFUSION: 500.0000 mL | Freq: Once | INTRAVENOUS | Status: DC

## 2018-11-13 MED ORDER — OXYTOCIN 40 UNITS IN NORMAL SALINE INFUSION - SIMPLE MED: 2.5000 [IU]/h | INTRAVENOUS | Status: DC

## 2018-11-13 MED ORDER — LACTATED RINGERS IV SOLN: 500.0000 mL | INTRAVENOUS | Status: DC | PRN

## 2018-11-13 MED ORDER — LIDOCAINE HCL (PF) 1 % IJ SOLN: 30.0000 mL | INTRAMUSCULAR | Status: DC | PRN

## 2018-11-13 MED ORDER — ACETAMINOPHEN 325 MG PO TABS: 650.0000 mg | ORAL_TABLET | ORAL | Status: DC | PRN

## 2018-11-13 MED ORDER — LACTATED RINGERS IV SOLN: INTRAVENOUS | Status: DC
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
## 2018-11-13 NOTE — Discharge Instructions (Signed)
[employer]  [phone] [facility] in [place]  [phone]

## 2018-11-13 NOTE — Progress Notes (Signed)
   11/13/18 1715  Clinical Encounter Type  Visited With Patient  Visit Type Initial  Referral From Nurse  Consult/Referral To Chaplain  Spiritual Encounters  Spiritual Needs Prayer;Emotional;Grief support  Patient was alert and awake upon arrival. Sitting up on bed and was about to eat meal. CH introduced self and completed spiritual care screening. Provided pastoral care through validation of emotions and feelings. Active/reflective listening skills. Pt shared concerns with CH. CH was a hopeful presence. Prayed with patient upon request. Pastoral visits are welcomed.

## 2018-11-13 NOTE — Discharge Summary (Signed)
RN took patient down to be transported by [employer] taxi service  to her place of residency
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
## 2018-11-13 NOTE — Discharge Summary (Signed)
RN reviewed discharged instructions with patient. Gave patient opportunity for questions. All questions answered at this time. Pt verbalized understanding of instructions. Pt requested to fill her water cup before leaving so we did that. RN contacted [employer] taxi service to transport patient back to her place of residency.
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
## 2018-11-13 NOTE — Discharge Summary (Signed)
Physician Discharge Summary  Patient ID: KYREE FEDORKO MRN: [id number] DOB/AGE: February 07, 1996 23 y.o.  Admit date: 11/13/2018 Discharge date: 11/13/2018  Admission Diagnoses:Active Problems:   Depression complicating pregnancy, antepartum   Pregnancy with abdominal pain of lower quadrant, antepartum   Decreased fetal movement  Discharge Diagnoses:  Active Problems:   Depression complicating pregnancy, antepartum   Pregnancy with abdominal pain of lower quadrant, antepartum   Discharged Condition: good  Hospital Course: pt seen, examined, ultrasound, labs, social work, and psyciatric  consults  Consults: social work, psyciatric  Significant Diagnostic Studies: labs: see results, normal; and radiology: Ultrasound: confirmatory for viable pregnancy  Treatments: none  Discharge Exam: Blood pressure 110/64, pulse 94, height 5\' 5"  (1.651 m), weight 88.5 kg. General appearance: alert, cooperative and no distress  Disposition: Discharge disposition: 01-Home or Self Care       Discharge Instructions    Call MD for:   Complete by:  As directed    Worsening contractions or pain; leakage of fluid; bleeding.   Diet general   Complete by:  As directed    Increase activity slowly   Complete by:  As directed      Allergies as of 11/13/2018   No Known Allergies     Medication List    STOP taking these medications   sulfamethoxazole-trimethoprim 800-160 MG tablet Commonly known as:  BACTRIM DS     TAKE these medications   acetaminophen 500 MG tablet Commonly known as:  TYLENOL Take 500-1,000 mg by mouth every 6 (six) hours as needed for mild pain or headache.   calcium carbonate 500 MG chewable tablet Commonly known as:  TUMS - dosed in mg elemental calcium Chew 1 tablet by mouth daily.   ferrous sulfate 324 MG Tbec Take 324 mg by mouth.   omega-3 acid ethyl esters 1 g capsule Commonly known as:  LOVAZA Take 500 mg by mouth daily.   ondansetron 4 MG disintegrating  tablet Commonly known as:  Zofran ODT Take 1 tablet (4 mg total) by mouth every 8 (eight) hours as needed for nausea or vomiting.   prenatal vitamin w/FE, FA 27-1 MG Tabs tablet Take 1 tablet by mouth daily at 12 noon.   promethazine 25 MG tablet Commonly known as:  PHENERGAN Take 25 mg by mouth every 6 (six) hours as needed for nausea or vomiting.      Follow-up Information    [facility]. Schedule an appointment as soon as possible for a visit.   Why:  PRENATAL CARE APPOINTMENT Contact information: [address]          Signed: Hoyt Koch 11/13/2018, 7:18 PM

## 2018-11-13 NOTE — OB Triage Note (Signed)
Pt. Reports falling out of bed a week ago landing on her L side. Reporting decreased fetal movement since fall. Denies vaginal bleeding. Denies LOF. Reports she has [facility] in [place]. Cheryl Fitzgerald

## 2018-11-15 LAB — RPR: RPR Ser Ql: NONREACTIVE

## 2018-12-07 ENCOUNTER — Encounter (HOSPITAL_COMMUNITY): Payer: Self-pay | Admitting: *Deleted

## 2018-12-07 ENCOUNTER — Inpatient Hospital Stay (HOSPITAL_COMMUNITY)
Admission: AD | Admit: 2018-12-07 | Discharge: 2018-12-07 | Disposition: A | Discharge status: Home / Self Care | Payer: Self-pay | Attending: Obstetrics and Gynecology | Admitting: Obstetrics and Gynecology

## 2018-12-07 DIAGNOSIS — O26892 Other specified pregnancy related conditions, second trimester: Secondary | ICD-10-CM | POA: Insufficient documentation

## 2018-12-07 DIAGNOSIS — Z87891 Personal history of nicotine dependence: Secondary | ICD-10-CM | POA: Insufficient documentation

## 2018-12-07 DIAGNOSIS — R109 Unspecified abdominal pain: Secondary | ICD-10-CM | POA: Insufficient documentation

## 2018-12-07 DIAGNOSIS — Z3A25 25 weeks gestation of pregnancy: Secondary | ICD-10-CM | POA: Insufficient documentation

## 2018-12-07 DIAGNOSIS — M549 Dorsalgia, unspecified: Secondary | ICD-10-CM | POA: Insufficient documentation

## 2018-12-07 DIAGNOSIS — Z3689 Encounter for other specified antenatal screening: Secondary | ICD-10-CM

## 2018-12-07 LAB — URINALYSIS, ROUTINE W REFLEX MICROSCOPIC
Bilirubin Urine: NEGATIVE
Glucose, UA: NEGATIVE mg/dL
Hgb urine dipstick: NEGATIVE
Ketones, ur: NEGATIVE mg/dL
Leukocytes,Ua: NEGATIVE
Nitrite: NEGATIVE
Protein, ur: NEGATIVE mg/dL
Specific Gravity, Urine: 1.003 — ABNORMAL LOW (ref 1.005–1.030)
pH: 7 (ref 5.0–8.0)

## 2018-12-07 LAB — WET PREP, GENITAL
Clue Cells Wet Prep HPF POC: NONE SEEN
Sperm: NONE SEEN
Trich, Wet Prep: NONE SEEN
Yeast Wet Prep HPF POC: NONE SEEN

## 2018-12-07 MED ORDER — ACETAMINOPHEN 500 MG PO TABS
1000.0000 mg | ORAL_TABLET | Freq: Once | ORAL | Status: AC
  Administered 2018-12-07: 1000 mg via ORAL
  Filled 2018-12-07: qty 2
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
## 2018-12-07 NOTE — Discharge Instructions (Signed)
Abdominal Pain During Pregnancy ° °Belly (abdominal) pain is common during pregnancy. There are many possible causes. Most of the time, it is not a serious problem. Other times, it can be a sign that something is wrong with the pregnancy. Always tell your doctor if you have belly pain. °Follow these instructions at home: °· Do not have sex or put anything in your vagina until your pain goes away completely. °· Get plenty of rest until your pain gets better. °· Drink enough fluid to keep your pee (urine) pale yellow. °· Take over-the-counter and prescription medicines only as told by your doctor. °· Keep all follow-up visits as told by your doctor. This is important. °Contact a doctor if: °· Your pain continues or gets worse after resting. °· You have lower belly pain that: °? Comes and goes at regular times. °? Spreads to your back. °? Feels like menstrual cramps. °· You have pain or burning when you pee (urinate). °Get help right away if: °· You have a fever or chills. °· You have vaginal bleeding. °· You are leaking fluid from your vagina. °· You are passing tissue from your vagina. °· You throw up (vomit) for more than 24 hours. °· You have watery poop (diarrhea) for more than 24 hours. °· Your baby is moving less than usual. °· You feel very weak or faint. °· You have shortness of breath. °· You have very bad pain in your upper belly. °Summary °· Belly (abdominal) pain is common during pregnancy. There are many possible causes. °· If you have belly pain during pregnancy, tell your doctor right away. °· Keep all follow-up visits as told by your doctor. This is important. °This information is not intended to replace advice given to you by your health care provider. Make sure you discuss any questions you have with your health care provider. °Document Released: 05/13/2009 Document Revised: 09/12/2018 Document Reviewed: 08/27/2016 °Elsevier Patient Education © 2020 Elsevier Inc. ° °

## 2018-12-07 NOTE — MAU Note (Signed)
Social work called in regards to pt not having a ride home. States that he will make some calls and call back to the unit

## 2018-12-07 NOTE — MAU Note (Addendum)
Pt presents via EMS with complaints of lower abdominal pain that started earlier today and has gotten worse throughout the day. PT denies any vaginal bleeding. Reports an increase in white vaginal discharge
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
## 2018-12-08 LAB — GC/CHLAMYDIA PROBE AMP (~~LOC~~) NOT AT ARMC
Chlamydia: NEGATIVE
Neisseria Gonorrhea: NEGATIVE

## 2018-12-20 ENCOUNTER — Inpatient Hospital Stay (HOSPITAL_COMMUNITY)
Admission: AD | Admit: 2018-12-20 | Discharge: 2018-12-20 | Disposition: A | Discharge status: Home / Self Care | Payer: Self-pay | Attending: Obstetrics and Gynecology | Admitting: Obstetrics and Gynecology

## 2018-12-20 ENCOUNTER — Other Ambulatory Visit: Payer: Self-pay

## 2018-12-20 ENCOUNTER — Encounter (HOSPITAL_COMMUNITY): Payer: Self-pay

## 2018-12-20 DIAGNOSIS — O98812 Other maternal infectious and parasitic diseases complicating pregnancy, second trimester: Secondary | ICD-10-CM | POA: Insufficient documentation

## 2018-12-20 DIAGNOSIS — O26892 Other specified pregnancy related conditions, second trimester: Secondary | ICD-10-CM

## 2018-12-20 DIAGNOSIS — B379 Candidiasis, unspecified: Secondary | ICD-10-CM | POA: Insufficient documentation

## 2018-12-20 DIAGNOSIS — Z87891 Personal history of nicotine dependence: Secondary | ICD-10-CM | POA: Insufficient documentation

## 2018-12-20 DIAGNOSIS — Z3A27 27 weeks gestation of pregnancy: Secondary | ICD-10-CM | POA: Insufficient documentation

## 2018-12-20 DIAGNOSIS — R102 Pelvic and perineal pain: Secondary | ICD-10-CM

## 2018-12-20 LAB — URINALYSIS, ROUTINE W REFLEX MICROSCOPIC
Bilirubin Urine: NEGATIVE
Glucose, UA: NEGATIVE mg/dL
Hgb urine dipstick: NEGATIVE
Ketones, ur: NEGATIVE mg/dL
Nitrite: NEGATIVE
Protein, ur: NEGATIVE mg/dL
Specific Gravity, Urine: 1.006 (ref 1.005–1.030)
pH: 6 (ref 5.0–8.0)

## 2018-12-20 LAB — WET PREP, GENITAL
Clue Cells Wet Prep HPF POC: NONE SEEN
Sperm: NONE SEEN
Trich, Wet Prep: NONE SEEN
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
## 2018-12-20 NOTE — MAU Note (Signed)
.   Cheryl Fitzgerald is a 23 y.o. at [redacted]w[redacted]d here in MAU reporting: lower abdominal pain and contractions that started a couple of days ago. Reports a white vaginal discharge. +FM  Onset of complaint: 2 days ago Pain score: 8 Vitals:   12/20/18 1544  BP: 117/61  Pulse: 93  Resp: 18  Temp: 98.6 F (37 C)  SpO2: 100%     FHT 125 Lab orders placed from triage: UA
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
## 2018-12-20 NOTE — MAU Note (Signed)
Social work called for consult.

## 2018-12-20 NOTE — Discharge Instructions (Signed)
Establish prenatal care in the area where you are living. Apply for Medicaid.  Pelvic pain in pregnancy/Pubic Symphysis Pain  Some women develop pelvic pain in pregnancy. This is sometimes called pregnancy-related pelvic girdle pain (PGP) or symphysis pubis dysfunction (SPD). PGP is a collection of uncomfortable symptoms caused by a stiffness of your pelvic joints or the joints moving unevenly at either the back or front of your pelvis.  Symptoms of PGP PGP is not harmful to your baby, but it can be painful and make it hard to get around.  Women with PGP may feel pain:   over the pubic bone at the front in the center, roughly level with your hips  across 1 or both sides of your lower back   in the area between your vagina and anus (perineum)   spreading to your thighs Some women feel or hear a clicking or grinding in the pelvic area.  The pain can be worse when you're:   walking   going up or down stairs  standing on 1 leg (for example, when you're getting dressed)   turning over in bed   moving your legs apart (for example, when you get out of a car) Most women with PGP can have a vaginal birth.  Non-urgent advice: Call your midwife or doctor if you have pelvic pain and:  it's hard for you to move around  it hurts to get out of a car or turn over in bed  it's painful going up or down stairs These can be signs of pregnancy-related pelvic girdle pain.  Treatments for PGP Getting diagnosed as early as possible can help keep pain to a minimum and avoid long-term discomfort.  You can ask your midwife for a referral to a physiotherapist who specializes in obstetric pelvic joint problems.  Physiotherapy aims to relieve or ease pain, improve muscle function, and improve your pelvic joint position and stability.  This may include:   manual therapy to make sure the joints of your pelvis, hip and spine move normally   exercises to strengthen your pelvic floor, stomach, back  and hip muscles   exercises in water   advice and suggestions, including positions for labor and birth, looking after your baby and positions for sex   pain relief, such as TENS  equipment, if necessary, such as crutches or pelvic support belts These problems tend not to get completely better until the baby is born, but treatment from an experienced practitioner can improve the symptoms during pregnancy.  You can contact the Pelvic Partnership (NaturalStorm.nlwww.pelvicpartnership.org.uk) for information and support.  Coping with pelvic pain in pregnancy Your physiotherapist may recommend a pelvic support belt to help ease your pain, or crutches to help you get around.  It can help to plan your day so you avoid activities that cause you pain. For example, do not go up or down stairs more often than you have to.  The Pelvic, Obstetric & Gynaecological Physiotherapy (POGP) network also offers this advice:   be as active as possible within your pain limits, and avoid activities that make the pain worse   rest when you can   get help with household chores from your partner, family and friends   wear flat, supportive shoes   sit down to get dressed - for example, do not stand on 1 leg when putting on jeans   keep your knees together when getting in and out of the car - a plastic bag on the seat can  help you swivel   sleep in a comfortable position - for example, on your side with a pillow between your legs   try different ways of turning over in bed - for example, turning over with your knees together and squeezing your buttocks   take the stairs 1 at a time, or go upstairs backwards or on your bottom   if you're using crutches, have a small backpack to carry things in   if you want to have sex, consider different positions, such as kneeling on all fours  POGP suggests that you avoid:   standing on 1 leg   bending and twisting to lift, or carrying a baby on 1 hip   crossing your legs    sitting on the floor, or sitting twisted   sitting or standing for long periods   lifting heavy weights, such as shopping bags, wet washing or a toddler   vacuuming   pushing heavy objects, such as a supermarket trolley   carrying anything in only 1 hand (try using a small backpack)  The physiotherapist should be able to provide advice on coping with the emotional impact of living with chronic pain, such as using relaxation techniques. If your pain is causing you considerable distress, then you should let your doctor or midwife know. You may require additional treatment. Download the POGP leaflet Pregnancy-related pelvic girdle pain for mothers-to-be and new mothers (https://mcdowell-oliver.com/). You can get more information on managing everyday activities with PGP from the Pelvic Partnership (ExceptionalEmployees.gl)  Labor and birth with pelvic pain Many women with pelvic pain in pregnancy can have a normal vaginal birth.  Plan ahead and talk about your birth plan with your birth partner and midwife.  Write in your birth plan that you have PGP, so the people supporting you during labor and birth will be aware of your condition.  Think about birth positions that are the most comfortable for you, and write them in your birth plan.  Being in water can take the weight off your joints and allow you to move more easily, so you might want to think about having a water birth. You can discuss this with your midwife.   Your 'pain-free range of movement' If you have pain when you open your legs, find out your pain-free range of movement.  To do this, lie on your back or sit on the edge of a chair and open your legs as far as you can without pain. Your partner or midwife can measure the distance between your knees with a tape measure. This is your pain-free range.  To protect your joints, try not to open your legs wider than this during labor and birth.  This is particularly  important if you have an epidural for pain relief in labor, as you won't be feeling the pain that warns you that you're separating your legs too far.  If you have an epidural, make sure your midwife and birth partner are aware of your pain-free range of movement of your legs. When pushing in the second stage of labor, you may find it beneficial to lie on one side.  This prevents your legs from being separated too much. You can stay in this position for the birth of your baby, if you wish. Sometimes it might be necessary to open your legs wider than your pain-free range to deliver your baby safely, particularly if you have an assisted delivery (for example, with the vacuum or foreps). Even in this case, it's  possible to limit the separation of your legs. Make sure your midwife and doctor are aware that you have PGP.  If you go beyond your pain-free range, your physiotherapist should assess you after the birth.  Take extra care until they have assessed and advised you.  Who gets pelvic pain in pregnancy? It's estimated that PGP affects up to 1 in 5 pregnant women to some degree.  It's not known exactly why pelvic pain affects some women, but it's thought to be linked to a number of issues, including previous damage to the pelvis, pelvic joints moving unevenly, and the weight or position of the baby.  Factors that may make a woman more likely to develop PGP include:   a history of lower back or pelvic girdle pain   previous injury to the pelvis (for example, from a fall or accident)  having PGP in a previous pregnancy   a physically demanding job  being overweight  having a multiple birth pregnancy  healthtalk.org has interviews with women talking about their experiences of pelvic pain in pregnancy. Read more about coping with common pregnancy problems (www.nhs.uk/conditions/pregnancy-and-baby) including nausea, heartburn, tiredness and constipation.  Talk with your doctor or midwife for  more information.

## 2018-12-21 NOTE — Progress Notes (Signed)
CSW spoke with patient yesterday who was agreeable to having CSW reach out to [employer] for additional support and resources. CSW spoke to [place] with [employer] who stated she is familiar with patient and that her colleague has been working with her for a while. CSW provided Peter Congo with updated information who reported she would have her colleague follow up with [name]. CSW also reached out to Ms. [place] with [employer] [facility] to see if [name] qualifies for their program. CSW awaiting return call. Peter Congo also agreeable to reach out to Ms. Lynnae Sandhoff to get more information.  Elijio Miles, [place]  Women[facility] and [employer] [phone]

## 2019-07-11 ENCOUNTER — Encounter: Payer: Self-pay | Admitting: *Deleted

## 2019-07-11 NOTE — Congregational Nurse Program (Signed)
  Dept: [phone]   Congregational Nurse Program Note  Date of Encounter: 07/11/2019  Client reports feelings of postpartum depression and anxiety after childbirth.  She reports that she gave birth to female child at [facility] on 03/26/19 and had a placental abruption and required a blood transfusion.  Client has not seen care provider since April 07, 2019 and is in need of medical follow up from childbirth and desires birth control.  Client also desires pediatric care for for infant. Nursing goals:  refer client to Waynetta Sandy, behavioral health nurse at [facility], refer client to [facility] for gyn/birth control services, refer client to [facility] for pediatric services for infant.  Past Medical History: Past Medical History:  Diagnosis Date  . Adjustment disorder with mixed emotional features 03/23/2015  . Depression    no medication  . Shortness of breath dyspnea     Encounter Details: CNP Questionnaire - 07/11/19 1644      Questionnaire   Race  Other or two or more races    Location Patient Served At  [employer]  Not Applicable    Uninsured  Uninsured (NEW 1x/quarter)    Food  No food insecurities    Housing/Utilities  No permanent housing    Transportation  Yes, need transportation assistance    Interpersonal Safety  Yes, feel physically and emotionally safe where you currently live    Medication  Yes, have medication insecurities    Medical Provider  No    Referrals  Behavioral/Mental Health Provider;[facility];Primary Care Provider/Clinic;[facility]    ED Visit Averted  Not Applicable    Life-Saving Intervention Made  Not Applicable

## 2019-07-14 ENCOUNTER — Telehealth: Payer: Self-pay | Admitting: *Deleted

## 2019-07-14 ENCOUNTER — Encounter: Payer: Self-pay | Admitting: *Deleted

## 2019-07-14 NOTE — Congregational Nurse Program (Signed)
  Dept: [phone]   Congregational Nurse Program Note  Date of Encounter: 07/14/2019  Past Medical History: Past Medical History:  Diagnosis Date  . Adjustment disorder with mixed emotional features 03/23/2015  . Depression    no medication  . Shortness of breath dyspnea     Encounter Details: CNP Questionnaire - 07/14/19 1240      Questionnaire   Patient Status  Not Applicable    Race  Other or two or more races    Location Patient Served At  [employer]  Not Applicable    Uninsured  Uninsured (NEW 1x/quarter)    Food  No food insecurities    Housing/Utilities  No permanent housing    Transportation  Provided transportation assistance (bus pass, taxi voucher, etc.)    Interpersonal Safety  Yes, feel physically and emotionally safe where you currently live    Medication  Yes, have medication insecurities    Medical Provider  No    Referrals  Behavioral/Mental Health Provider    ED Visit Averted  Not Applicable    Life-Saving Intervention Made  Not Applicable      Pt seen at [facility] today along with her advocate Fredirick Lathe. Pt is requesting help with mental health services wanting to see a counselor and needing transportation assistance. Called [facility] [place] and pt is to go on Monday February 8th at 1:00 for walk in assessment. Pt will be picked up by lift at 12:00 for transportation assistance.

## 2019-07-14 NOTE — Telephone Encounter (Signed)
Called to get appointment information and to see if child could be brought with pt to appointment.

## 2019-07-17 ENCOUNTER — Telehealth: Payer: Self-pay | Admitting: *Deleted

## 2019-07-17 NOTE — Telephone Encounter (Signed)
Called pt about her appointment today with [facility] the [place]. Pt is to be picked up at 12:00 [facility] and gave pt number [phone] to call after appointment for ride back to [facility]. Transportation release form has been signed and sent to Present[employer], [employer].

## 2019-07-24 ENCOUNTER — Telehealth: Payer: Self-pay | Admitting: *Deleted

## 2019-07-24 NOTE — Telephone Encounter (Signed)
Pt's advocate emailed and called to request transportation services for client to [facility] for her infant. Per director Fransisco Beau ok for a one time transport to this facility in [place]. Client is in process of changing medicaid from [place] to [place] for infant.

## 2019-07-24 NOTE — Telephone Encounter (Signed)
Set up transportation for client. Called client with information and while on phone, she reported that she has an appointment on Feb 18th 1200 with [facility] [place] and needs transportation. Scheduled transportation per pt request.

## 2019-07-27 ENCOUNTER — Telehealth: Payer: Self-pay | Admitting: *Deleted

## 2019-07-27 NOTE — Telephone Encounter (Signed)
Client Architectural technologist to inform that the appointment scheduled for today with [facility] [place] had been canceled due to bad weather. The driver scheduled to take her to the appointment was waiting outside of [facility]. She also reported an upcoming appointment with the NP at [facility] on [phone].

## 2019-07-27 NOTE — Telephone Encounter (Signed)
Called [facility] to inform them that the scheduled pick up needed to be canceled due to appointment canceled for today with bad weather. Scheduled another pick up time for clients NP visit on February 23rd for 1130 pickup and 1200 appointment at [facility].

## 2019-07-31 ENCOUNTER — Telehealth: Payer: Self-pay | Admitting: *Deleted

## 2019-07-31 NOTE — Telephone Encounter (Signed)
Left message for client that appointment with [facility] is for March 4th at 1:00 and transportation had been arranged for pick up at 12:30. Also a reminder for tomorrow's app with NP.

## 2019-07-31 NOTE — Telephone Encounter (Signed)
Contacted [facility] [place] to verify next appointment information for pt. She has an NP appointment for February 23 that has already been arranged with transportation. She has a rescheduled appointment with therapist March 4th at 1:00.

## 2019-07-31 NOTE — Telephone Encounter (Signed)
Call to set up transportation from [facility] to [facility] on March 4th. PIck up scheduled for 12:30 and appointment is at 1:00.

## 2019-08-02 ENCOUNTER — Telehealth: Payer: Self-pay | Admitting: *Deleted

## 2019-08-02 ENCOUNTER — Other Ambulatory Visit: Payer: Self-pay

## 2019-08-02 DIAGNOSIS — Z20822 Contact with and (suspected) exposure to covid-19: Secondary | ICD-10-CM

## 2019-08-02 NOTE — Telephone Encounter (Signed)
Pt called again to inform that she had been able to get all of her medications except Melatonin. She is requesting help with this medication.

## 2019-08-02 NOTE — Telephone Encounter (Signed)
Returned pt's phone call and pt informed that she has another appointment with [facility] on March 23rd at 2:30. Pt said that she had medication prescribed from last visit and pt advocate at [facility] was getting the medication from [facility] for her. Pt requested help with transportation for the next appointment.

## 2019-08-02 NOTE — Telephone Encounter (Signed)
Contacted pt's NP to verify medication and dosage. Per NP she will check her [facility] location tomorrow to see if there are any samples. If she she does not have any available, writer will attempt to get through CNP.

## 2019-08-03 LAB — NOVEL CORONAVIRUS, NAA: SARS-CoV-2, NAA: NOT DETECTED

## 2019-08-04 ENCOUNTER — Telehealth: Payer: Self-pay | Admitting: *Deleted

## 2019-08-04 NOTE — Telephone Encounter (Signed)
Contacted pt to let her know writer was on the way to [facility] with medication and needed a signature for voucher. Gave medication to pt and emailed copy of voucher to pharmacy.

## 2019-08-04 NOTE — Telephone Encounter (Signed)
Called [facility] to inquire about medication Melatonin being filled through CNP. They requested prescription for medication. Called NP no answer and called pt for a paper copy. Pt reports she has no copy. Went to [employer] Dept for copy. They referred [facility] to call them. Left [facility] and Micah Flesher to [facility] for pick up. Completed [facility] Medication Voucher and emailed back to pharmacy after pt signed.

## 2019-08-08 ENCOUNTER — Encounter: Payer: Self-pay | Admitting: *Deleted

## 2019-08-08 NOTE — Congregational Nurse Program (Signed)
  Dept: [phone]   Congregational Nurse Program Note  Date of Encounter: 08/08/2019  Patient has been seen by a therapist at [facility]. Patient's family advocate at [facility], Fredirick Lathe reports that patient has shown improvement with impulse control and has shown fewer emotional outbursts since starting antidepressant medication prescribed at [facility].  Past Medical History: Past Medical History:  Diagnosis Date  . Adjustment disorder with mixed emotional features 03/23/2015  . Depression    no medication  . Shortness of breath dyspnea     Encounter Details: CNP Questionnaire - 08/08/19 1603      Questionnaire   Race  Hispanic or Latino    Location Patient Served At  [employer]  Not Applicable    Uninsured  Uninsured (NEW 1x/quarter)    Housing/Utilities  No permanent housing    Transportation  Yes, need transportation assistance    Interpersonal Safety  Yes, feel physically and emotionally safe where you currently live    Medication  Yes, have medication insecurities    Medical Provider  No    Referrals  Behavioral/Mental Health Provider;Primary Care Provider/Clinic    ED Visit Averted  Not Applicable    Life-Saving Intervention Made  Not Applicable

## 2019-08-11 ENCOUNTER — Telehealth: Payer: Self-pay | Admitting: *Deleted

## 2019-08-11 NOTE — Telephone Encounter (Signed)
Client called and informed writer that she has appointments with [facility] on March 19th 4:00 and April 1st 5:00 with [facility] therapist. Pt is requesting help with transportation. Told pt would schedule pick up 30 minutes before appointment. Also verified appointment with NP Lavinia Sharps at [facility] on March 23rd 2:30. Pick up for 2:00

## 2019-08-21 ENCOUNTER — Telehealth: Payer: Self-pay | Admitting: *Deleted

## 2019-08-21 NOTE — Telephone Encounter (Signed)
Called [employer] coordinator [place] and set up transportation for March 19th from [facility] to [facility]. Pickup at 3:23 and appointment at 4:00. Scheduled March 23rd [employer] to [facility]. Pick up at 1:53 for 2:30 appointment. Waynetta Sandy RN [phone]

## 2019-09-01 ENCOUNTER — Telehealth: Payer: Self-pay | Admitting: *Deleted

## 2019-09-01 NOTE — Telephone Encounter (Signed)
Called client representative to let her know that new transportation was being set up for client with [employer]. New transportation has been approved by [employer]. Information received that client was having difficulty with prior transportation and needed additional assistance with car seat, stroller etc.

## 2019-09-01 NOTE — Telephone Encounter (Signed)
Called client to inform her that transportation was scheduled with [employer] for April 1st. Pick up at 4:30 to go to [facility] for 5:00 appointment. She informed Clinical research associate that she has another appointment on April 16th at [facility] with therapist Gean Maidens.

## 2019-09-01 NOTE — Telephone Encounter (Signed)
Called [facility] and set up transportation for April 1st pick up at 4:30 for 5:00 appt with [facility]. Transportation with [employer].

## 2019-09-13 ENCOUNTER — Telehealth: Payer: Self-pay | Admitting: *Deleted

## 2019-09-13 NOTE — Telephone Encounter (Signed)
Client called to remind writer of April 16th appointment with [facility] for therapist and asked for transportation. Client had lost her card for upcoming NP appointment with [facility]. Called [facility] while client on hold and found that she had an NP appointment on April 20th at 2:30. Gave information to pt and will schedule transportation closer to appt date. She reports that the prior transportation with [employer] went well.

## 2019-09-13 NOTE — Telephone Encounter (Signed)
Called [facility] and was told that next NP appointment is April 20th at 2:30.

## 2019-09-13 NOTE — Telephone Encounter (Signed)
Called and left message for return call between 8-2 as pt had left message after 2 on Monday.

## 2019-09-15 ENCOUNTER — Telehealth: Payer: Self-pay | Admitting: *Deleted

## 2019-09-15 NOTE — Telephone Encounter (Signed)
Called [facility] [employer] and spoke with [place]. Transportation was set up using [employer] for April 16th 3:00 appt pick up at 2:30 and April 20th 2:30 appt pick up at 2:00. Both appointments for [facility]

## 2019-10-02 ENCOUNTER — Telehealth: Payer: Self-pay | Admitting: *Deleted

## 2019-10-02 NOTE — Telephone Encounter (Signed)
Called and set up transportation with [facility] through [facility] for April 30th 11a appointment with [facility]. Pick up time is 10:30. Client had lt a message on writers phone that she needed transportation.

## 2019-10-02 NOTE — Telephone Encounter (Signed)
Left message with client that transportation had been set up for April 30th to [facility] and pick up time would be 10:30. Requested that client return call with any additional appointments.

## 2019-10-02 NOTE — Telephone Encounter (Signed)
Client called and reports she has appointments with [facility] on May 18 with Lavinia Sharps NP at 3p and therapist at [facility] on May 21 at 2p. She will call back with any changes.

## 2019-10-06 ENCOUNTER — Other Ambulatory Visit: Payer: Self-pay

## 2019-10-06 DIAGNOSIS — Z20822 Contact with and (suspected) exposure to covid-19: Secondary | ICD-10-CM

## 2019-10-07 LAB — SARS-COV-2, NAA 2 DAY TAT

## 2019-10-07 LAB — NOVEL CORONAVIRUS, NAA: SARS-CoV-2, NAA: NOT DETECTED

## 2019-10-10 ENCOUNTER — Encounter: Payer: Self-pay | Admitting: *Deleted

## 2019-10-10 NOTE — Congregational Nurse Program (Signed)
  Dept: [phone]   Congregational Nurse Program Note  Date of Encounter: 10/10/2019  Patient experiencing symptoms of cough and cold.  Temperature 97.7 @ 3:45 PM.  C/O aches and thirst.  Advised patient to drink extra water and juices.  Patient has boil on inner thigh of right leg and was advised by paramedics on 10/09/19 to apply hot compresses to boil.  Advised patient to continue doing this treatment.  Past Medical History: Past Medical History:  Diagnosis Date  . Adjustment disorder with mixed emotional features 03/23/2015  . Depression    no medication  . Shortness of breath dyspnea     Encounter Details: CNP Questionnaire - 10/10/19 1655      Questionnaire   Patient Status  Immigrant    Race  Hispanic or Latino    Location Patient Served At  [employer]  Not Applicable    Uninsured  Uninsured (Subsequent visits/quarter)    Food  No food insecurities    Housing/Utilities  No permanent housing    Transportation  Yes, need transportation assistance    Interpersonal Safety  Yes, feel physically and emotionally safe where you currently live    Medication  Yes, have medication insecurities    Medical Provider  No    Referrals  Behavioral/Mental Health Provider;[facility];Emergency Department    ED Visit Averted  Not Applicable    Life-Saving Intervention Made  Not Applicable

## 2019-10-12 ENCOUNTER — Ambulatory Visit: Payer: Self-pay | Admitting: Physician Assistant

## 2019-10-12 ENCOUNTER — Other Ambulatory Visit: Payer: Self-pay

## 2019-10-12 VITALS — BP 129/76 | HR 93 | Temp 98.2°F | Resp 18 | Ht 66.5 in

## 2019-10-12 DIAGNOSIS — Z591 Inadequate housing: Secondary | ICD-10-CM

## 2019-10-12 DIAGNOSIS — L03115 Cellulitis of right lower limb: Secondary | ICD-10-CM
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
## 2019-10-12 NOTE — Patient Instructions (Signed)
Apply the topical antibiotic cream several times a day for 7 days or 2 days after the boil resolves.  Keep the area clean and dry.  Please let us know if there is anything else we can do for you, I hope you feel better soon  Roney Jaffe, PA-C Physician Assistant [facility] [email or web address]    Skin Abscess  A skin abscess is an infected area on or under your skin that contains a collection of pus and other material. An abscess may also be called a furuncle, carbuncle, or boil. An abscess can occur in or on almost any part of your body. Some abscesses break open (rupture) on their own. Most continue to get worse unless they are treated. The infection can spread deeper into the body and eventually into your blood, which can make you feel ill. Treatment usually involves draining the abscess. What are the causes? An abscess occurs when germs, like bacteria, pass through your skin and cause an infection. This may be caused by:  A scrape or cut on your skin.  A puncture wound through your skin, including a needle injection or insect bite.  Blocked oil or sweat glands.  Blocked and infected hair follicles.  A cyst that forms beneath your skin (sebaceous cyst) and becomes infected. What increases the risk? This condition is more likely to develop in people who:  Have a weak body defense system (immune system).  Have diabetes.  Have dry and irritated skin.  Get frequent injections or use illegal IV drugs.  Have a foreign body in a wound, such as a splinter.  Have problems with their lymph system or veins. What are the signs or symptoms? Symptoms of this condition include:  A painful, firm bump under the skin.  A bump with pus at the top. This may break through the skin and drain. Other symptoms include:  Redness surrounding the abscess site.  Warmth.  Swelling of the lymph nodes (glands) near the abscess.   Tenderness.  A sore on the skin. How is this diagnosed? This condition may be diagnosed based on:  A physical exam.  Your medical history.  A sample of pus. This may be used to find out what is causing the infection.  Blood tests.  Imaging tests, such as an ultrasound, CT scan, or MRI. How is this treated? A small abscess that drains on its own may not need treatment. Treatment for larger abscesses may include:  Moist heat or heat pack applied to the area several times a day.  A procedure to drain the abscess (incision and drainage).  Antibiotic medicines. For a severe abscess, you may first get antibiotics through an IV and then change to antibiotics by mouth. Follow these instructions at home: Medicines   Take over-the-counter and prescription medicines only as told by your health care provider.  If you were prescribed an antibiotic medicine, take it as told by your health care provider. Do not stop taking the antibiotic even if you start to feel better. Abscess care   If you have an abscess that has not drained, apply heat to the affected area. Use the heat source that your health care provider recommends, such as a moist heat pack or a heating pad. ? Place a towel between your skin and the heat source. ? Leave the heat on for 20-30 minutes. ? Remove the heat if your skin turns bright red. This is especially important if you are unable to feel pain, heat, or  cold. You may have a greater risk of getting burned.  Follow instructions from your health care provider about how to take care of your abscess. Make sure you: ? Cover the abscess with a bandage (dressing). ? Change your dressing or gauze as told by your health care provider. ? Wash your hands with soap and water before you change the dressing or gauze. If soap and water are not available, use hand sanitizer.  Check your abscess every day for signs of a worsening infection. Check for: ? More redness, swelling, or  pain. ? More fluid or blood. ? Warmth. ? More pus or a bad smell. General instructions  To avoid spreading the infection: ? Do not share personal care items, towels, or hot tubs with others. ? Avoid making skin contact with other people.  Keep all follow-up visits as told by your health care provider. This is important. Contact a health care provider if you have:  More redness, swelling, or pain around your abscess.  More fluid or blood coming from your abscess.  Warm skin around your abscess.  More pus or a bad smell coming from your abscess.  A fever.  Muscle aches.  Chills or a general ill feeling. Get help right away if you:  Have severe pain.  See red streaks on your skin spreading away from the abscess. Summary  A skin abscess is an infected area on or under your skin that contains a collection of pus and other material.  A small abscess that drains on its own may not need treatment.  Treatment for larger abscesses may include having a procedure to drain the abscess and taking an antibiotic. This information is not intended to replace advice given to you by your health care provider. Make sure you discuss any questions you have with your health care provider. Document Revised: 09/15/2018 Document Reviewed: 07/08/2017 Elsevier Patient Education  2020 [employer].

## 2019-10-16 ENCOUNTER — Telehealth: Payer: Self-pay | Admitting: *Deleted

## 2019-10-16 NOTE — Telephone Encounter (Signed)
Contacted [employer] [employer] and scheduled [employer] service for May 18th appt [facility]SP 3:00 and May 21st appt 2:00. Both pick up times 30 minutes before appt.

## 2019-10-16 NOTE — Telephone Encounter (Signed)
Left client a message that transportation had been scheduled for her May 18th and May 21st appointments.

## 2019-10-18 ENCOUNTER — Other Ambulatory Visit: Payer: Self-pay

## 2019-10-18 ENCOUNTER — Emergency Department (HOSPITAL_COMMUNITY): Payer: HRSA Program

## 2019-10-18 ENCOUNTER — Emergency Department (HOSPITAL_COMMUNITY)
Admission: EM | Admit: 2019-10-18 | Discharge: 2019-10-18 | Disposition: A | Discharge status: Home / Self Care | Payer: HRSA Program | Attending: Emergency Medicine | Admitting: Emergency Medicine

## 2019-10-18 DIAGNOSIS — Z79899 Other long term (current) drug therapy: Secondary | ICD-10-CM | POA: Insufficient documentation

## 2019-10-18 DIAGNOSIS — R509 Fever, unspecified: Secondary | ICD-10-CM | POA: Diagnosis present

## 2019-10-18 DIAGNOSIS — U071 COVID-19: Secondary | ICD-10-CM | POA: Insufficient documentation

## 2019-10-18 DIAGNOSIS — Z87891 Personal history of nicotine dependence: Secondary | ICD-10-CM | POA: Insufficient documentation

## 2019-10-18 LAB — LIPASE, BLOOD: Lipase: 23 U/L (ref 11–51)

## 2019-10-18 LAB — COMPREHENSIVE METABOLIC PANEL
ALT: 23 U/L (ref 0–44)
AST: 25 U/L (ref 15–41)
Albumin: 3.9 g/dL (ref 3.5–5.0)
Alkaline Phosphatase: 99 U/L (ref 38–126)
Anion gap: 11 (ref 5–15)
BUN: <5 mg/dL — ABNORMAL LOW (ref 6–20)
CO2: 24 mmol/L (ref 22–32)
Calcium: 8.6 mg/dL — ABNORMAL LOW (ref 8.9–10.3)
Chloride: 102 mmol/L (ref 98–111)
Creatinine, Ser: 0.62 mg/dL (ref 0.44–1.00)
GFR calc Af Amer: >60 mL/min (ref >60)
GFR calc non Af Amer: >60 mL/min (ref >60)
Glucose, Bld: 106 mg/dL — ABNORMAL HIGH (ref 70–99)
Potassium: 3.4 mmol/L — ABNORMAL LOW (ref 3.5–5.1)
Sodium: 137 mmol/L (ref 135–145)
Total Bilirubin: 0.5 mg/dL (ref 0.3–1.2)
Total Protein: 7.1 g/dL (ref 6.5–8.1)

## 2019-10-18 LAB — URINALYSIS, ROUTINE W REFLEX MICROSCOPIC
Bilirubin Urine: NEGATIVE
Glucose, UA: NEGATIVE mg/dL
Hgb urine dipstick: NEGATIVE
Ketones, ur: NEGATIVE mg/dL
Nitrite: NEGATIVE
Protein, ur: NEGATIVE mg/dL
Specific Gravity, Urine: 1.009 (ref 1.005–1.030)
pH: 6 (ref 5.0–8.0)

## 2019-10-18 LAB — I-STAT BETA HCG BLOOD, ED (MC, WL, AP ONLY): I-stat hCG, quantitative: <5 m[IU]/mL (ref <5)

## 2019-10-18 LAB — CBC
HCT: 44.6 % (ref 36.0–46.0)
Hemoglobin: 14 g/dL (ref 12.0–15.0)
MCH: 26.5 pg (ref 26.0–34.0)
MCHC: 31.4 g/dL (ref 30.0–36.0)
MCV: 84.5 fL (ref 80.0–100.0)
Platelets: 208 10*3/uL (ref 150–400)
RBC: 5.28 MIL/uL — ABNORMAL HIGH (ref 3.87–5.11)
RDW: 13.1 % (ref 11.5–15.5)
WBC: 2.8 10*3/uL — ABNORMAL LOW (ref 4.0–10.5)
nRBC: 0 % (ref 0.0–0.2)

## 2019-10-18 LAB — POC SARS CORONAVIRUS 2 AG -  ED: SARS Coronavirus 2 Ag: NEGATIVE

## 2019-10-18 LAB — SARS CORONAVIRUS 2 BY RT PCR (HOSPITAL ORDER, PERFORMED IN ~~LOC~~ HOSPITAL LAB): SARS Coronavirus 2: POSITIVE — AB

## 2019-10-18 MED ORDER — SODIUM CHLORIDE 0.9 % IV BOLUS
1000.0000 mL | Freq: Once | INTRAVENOUS | Status: AC
  Administered 2019-10-18: 1000 mL via INTRAVENOUS

## 2019-10-18 MED ORDER — ACETAMINOPHEN 500 MG PO TABS
1000.0000 mg | ORAL_TABLET | Freq: Once | ORAL | Status: AC
  Administered 2019-10-18: 17:00:00 1000 mg via ORAL
  Filled 2019-10-18: qty 2

## 2019-10-18 MED ORDER — ONDANSETRON HCL 4 MG/2ML IJ SOLN
4.0000 mg | Freq: Once | INTRAMUSCULAR | Status: AC
  Administered 2019-10-18: 17:00:00 4 mg via INTRAVENOUS
  Filled 2019-10-18: qty 2

## 2019-10-18 MED ORDER — ACETAMINOPHEN 500 MG PO TABS
1000.0000 mg | ORAL_TABLET | Freq: Once | ORAL | Status: AC
  Administered 2019-10-18: 23:00:00 1000 mg via ORAL
  Filled 2019-10-18: qty 2

## 2019-10-18 MED ORDER — BENZONATATE 100 MG PO CAPS
100.0000 mg | ORAL_CAPSULE | Freq: Once | ORAL | Status: AC
  Administered 2019-10-18: 100 mg via ORAL
  Filled 2019-10-18: qty 1

## 2019-10-18 MED ORDER — SODIUM CHLORIDE 0.9% FLUSH: 3.0000 mL | Freq: Once | INTRAVENOUS | Status: DC

## 2019-10-18 NOTE — Social Work (Addendum)
CSW met with Pt at bedside. CSW was able to gather information about current living arrangement. CSW spoke with [employer] @ Shelter and will coordinate for baby needs should Pt need to be admitted.    (Name of shelter redacted)

## 2019-10-18 NOTE — ED Notes (Signed)
Sign-pad unavailable at discahrge. Pt provided with discharge instructions and verbalizes understanding. Pt ambulatory with no impairment to gait. A&ox4. Educated extensively on how to take care of herself while having covid.

## 2019-10-18 NOTE — Discharge Planning (Signed)
RNCM met with pt in lobby regarding care for her baby.  Pt states she does not have family in the area and the child's father is not reliable.  Pt states her shelter advocate advised her to ask for a Education officer, museum when she arrived to help with baby while she is being seen.

## 2019-10-18 NOTE — Discharge Instructions (Addendum)
You have been diagnosed with COVID-19 infection.  Take tylenol as needed for fever and/or body aches.  Stay hydrated by drinking plenty of fluid.  Rest and relax as best as your can.  If you develop significant shortness of breath, do not hesitate to return to the ER for further evaluation.

## 2019-10-18 NOTE — Progress Notes (Signed)
CSW contacted Cheryl Fitzgerald at shelter and staffed case with her in regards to d/c.  CSW will continue to follow.

## 2019-10-18 NOTE — ED Notes (Signed)
Pt IV obstructed. Removed by this RN. Greta Doom, PA made aware. Verbal order to have pt drink plenty of fluids rather than start another IV and hang fluids.

## 2019-10-18 NOTE — Progress Notes (Signed)
CSW completed paperwork for Covid hotel. Pt and baby will be transported via [facility] department.  TOC team provided formula,diapers, clothing for baby and mother as well as personal care items for Pt.

## 2019-10-18 NOTE — ED Triage Notes (Signed)
[facility] bib [facility] with NVD fatigue loss of appetite, loss of taste for the last week. did have covid exposure approx 2 weeks ago. 124/86 HR 100 98% RA t 99.85F

## 2019-10-18 NOTE — ED Provider Notes (Signed)
[facility] EMERGENCY DEPARTMENT Provider Note   CSN: [id number] Arrival date & time: 10/18/19  1419     History No chief complaint on file.   Cheryl Fitzgerald is a 24 y.o. female.  The history is provided by the patient and medical records. No language interpreter was used.      24 year old female with history of depression, homelessness, brought here via EMS from [facility] with concern for Covid infection.  Patient report one of her roommates recently tested positive for COVID-19  over a week ago.  For the past 4 to 5 days she has had symptoms which include fever, chills, body aches, persistent dry cough, pain in her chest, posttussive emesis, as well as having persistent loose stools diarrhea.  States she would have 4-5 episodes of diarrhea per day.  She vomits when she coughs.  She endorsed generalized fatigue, and overall not feeling well.  She report her roommate has similar symptoms which includes vomiting and diarrhea.  She tries taking Tylenol at home without adequate relief.  She does have a newborn that she is currently breast-feeding.  She reports concern for Covid infection.  She reports she was tested several days prior but it was negative.  Her symptoms still persist.  She does not have any family member around to help her, and report being in a shelter, it is difficult for her to quarantine.  Patient reports she feels dehydrated.   Past Medical History:  Diagnosis Date  . Adjustment disorder with mixed emotional features 03/23/2015  . Depression    no medication  . Shortness of breath dyspnea     Patient Active Problem List   Diagnosis Date Noted  . Pregnancy with abdominal pain of lower quadrant, antepartum 11/13/2018  . 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 Cellulitis of left arm   . Acquired hyperbilirubinemia   . Choledocholithiasis   . Non-specific filling defect of common bile duct   . Calculous cholecystitis with obstruction  08/08/2015  . 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  . Post-dates pregnancy 04/05/2015  . Adjustment disorder with mixed emotional features 03/23/2015  . Depression complicating pregnancy, antepartum 03/22/2015  . Supervision of high risk pregnancy due to social problems in third trimester 03/22/2015  . 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 Inadequate housing 03/22/2015  . Food hunger 03/22/2015  . Decreased fetal movement determined by examination 03/06/2015    Past Surgical History:  Procedure Laterality Date  . CHOLECYSTECTOMY N/A 08/08/2015   Procedure: LAPAROSCOPIC CHOLECYSTECTOMY WITH INTRAOPERATIVE CHOLANGIOGRAM;  Surgeon: Leafy Ro, MD;  Location: [facility] ORS;  Service: General;  Laterality: N/A;  . ENDOSCOPIC RETROGRADE CHOLANGIOPANCREATOGRAPHY (ERCP) WITH PROPOFOL N/A 08/09/2015   Procedure: ENDOSCOPIC RETROGRADE CHOLANGIOPANCREATOGRAPHY (ERCP) WITH PROPOFOL;  Surgeon: Midge Minium, MD;  Location: [facility] ENDOSCOPY;  Service: Endoscopy;  Laterality: N/A;  . NO PAST SURGERIES    . VAGINAL DELIVERY  04/07/2015     OB History    Gravida  2   Para  1   Term  1   Preterm      AB  0   Living  1     SAB  0   TAB      Ectopic      Multiple  0   Live Births  1           Family History  Problem Relation Age of Onset  . Diabetes Mother   . Hyperlipidemia Mother   . Hypertension Mother   .  Arthritis Father   . Depression Father   . Breast cancer Maternal Grandmother   . Diabetes Maternal Grandmother     Social History   Tobacco Use  . Smoking status: Former Smoker    Packs/day: 0.10    Types: Cigarettes  . Smokeless tobacco: Never Used  Substance Use Topics  . Alcohol use: Not Currently  . Drug use: Not Currently    Types: Marijuana    Comment: patient states that she is unsure if her marijuana has been "laced". recent use 3 days ago    Home Medications Prior to Admission medications   Medication Sig Start Date End Date  Taking? Authorizing Provider  acetaminophen (TYLENOL) 500 MG tablet Take 500-1,000 mg by mouth every 6 (six) hours as needed for mild pain or headache.    Yes [provider]  calcium carbonate (TUMS - DOSED IN MG ELEMENTAL CALCIUM) 500 MG chewable tablet Chew 1 tablet by mouth daily.    [provider]  ferrous sulfate 324 MG TBEC Take 324 mg by mouth.    [provider]  MELATONIN MAXIMUM STRENGTH 5 MG TABS Take 5 mg by mouth at bedtime. 08/04/19   [provider]  omega-3 acid ethyl esters (LOVAZA) 1 g capsule Take 500 mg by mouth daily.    [provider]  prenatal vitamin w/FE, FA (PRENATAL 1 + 1) 27-1 MG TABS tablet Take 1 tablet by mouth daily at 12 noon.    [provider]  ZOLOFT 25 MG tablet Take 25 mg by mouth daily. 09/26/19   [provider]    Allergies    Patient has no known allergies.  Review of Systems   Review of Systems  All other systems reviewed and are negative.   Physical Exam Updated Vital Signs BP 115/81   Pulse 84   Temp 99.7 F (37.6 C) (Oral)   Resp 18   LMP 09/24/2019   SpO2 97%   Physical Exam Vitals and nursing note reviewed.  Constitutional:      General: She is not in acute distress.    Appearance: She is well-developed.  HENT:     Head: Atraumatic.  Eyes:     Conjunctiva/sclera: Conjunctivae normal.  Cardiovascular:     Rate and Rhythm: Tachycardia present.     Pulses: Normal pulses.     Heart sounds: Normal heart sounds.  Pulmonary:     Effort: Pulmonary effort is normal.     Breath sounds: Rhonchi present. No wheezing or rales.  Abdominal:     Palpations: Abdomen is soft.     Tenderness: There is no abdominal tenderness.  Musculoskeletal:     Cervical back: Neck supple.     Right lower leg: No edema.     Left lower leg: No edema.  Skin:    Findings: No rash.  Neurological:     Mental Status: She is alert and oriented to person, place, and time.  Psychiatric:         Mood and Affect: Mood normal.     ED Results / Procedures / Treatments   Labs (all labs ordered are listed, but only abnormal results are displayed) Labs Reviewed  SARS CORONAVIRUS 2 BY RT PCR (HOSPITAL ORDER, PERFORMED IN [facility]) - Abnormal; Notable for the following components:      Result Value   SARS Coronavirus 2 POSITIVE (*)    All other components within normal limits  COMPREHENSIVE METABOLIC PANEL - Abnormal; Notable for  the following components:   Potassium 3.4 (*)    Glucose, Bld 106 (*)    BUN <5 (*)    Calcium 8.6 (*)    All other components within normal limits  CBC - Abnormal; Notable for the following components:   WBC 2.8 (*)    RBC 5.28 (*)    All other components within normal limits  URINALYSIS, ROUTINE W REFLEX MICROSCOPIC - Abnormal; Notable for the following components:   Leukocytes,Ua SMALL (*)    Bacteria, UA FEW (*)    All other components within normal limits  LIPASE, BLOOD  I-STAT BETA HCG BLOOD, ED ([facility], [facility], [facility] ONLY)  POC SARS CORONAVIRUS 2 AG -  ED    EKG None  Radiology DG Chest Portable 1 View  Result Date: 10/18/2019 CLINICAL DATA:  Nausea, vomiting and loss of taste. EXAM: PORTABLE CHEST 1 VIEW COMPARISON:  August 14, 2015 FINDINGS: Very mild, hazy atelectasis and/or infiltrate is seen within the bilateral lung bases and mid left lung. There is no evidence of a pleural effusion or pneumothorax. The heart size and mediastinal contours are within normal limits. The visualized skeletal structures are unremarkable. Radiopaque surgical clips are seen overlying the right upper quadrant. IMPRESSION: Very mild bilateral atelectasis and/or infiltrate. Electronically Signed   By: Virgina Norfolk M.D.   On: 10/18/2019 17:41    Procedures Procedures (including critical care time)  Medications Ordered in ED Medications  sodium chloride flush (NS) 0.9 % injection 3 mL (0 mLs Intravenous Hold 10/18/19 1616)  acetaminophen (TYLENOL)  tablet 1,000 mg (has no administration in time range)  sodium chloride 0.9 % bolus 1,000 mL (0 mLs Intravenous Stopped 10/18/19 1821)  benzonatate (TESSALON) capsule 100 mg (100 mg Oral Given 10/18/19 1659)  acetaminophen (TYLENOL) tablet 1,000 mg (1,000 mg Oral Given 10/18/19 1658)  ondansetron (ZOFRAN) injection 4 mg (4 mg Intravenous Given 10/18/19 1659)    ED Course  I have reviewed the triage vital signs and the nursing notes.  Pertinent labs & imaging results that were available during my care of the patient were reviewed by me and considered in my medical decision making (see chart for details).    MDM Rules/Calculators/A&P                     BP 115/81   Pulse 84   Temp 99.7 F (37.6 C) (Oral)   Resp 18   LMP 09/24/2019   SpO2 97%   Final Clinical Impression(s) / ED Diagnoses Final diagnoses:  COVID-19 virus infection    Rx / DC Orders ED Discharge Orders    None     10:21 PM Patient with recent COVID-19 exposure now having symptoms concerning for COVID-19 infection.  She is also currently breast-feeding.  She endorsed nausea vomiting diarrhea but has a very benign abdominal exam.  She is not hypoxic.  Will provide symptomatic treatment, will check COVID-19 status.  10:21 PM COVID-19 test is positive.  Labs are mostly reassuring, chest x-ray shows mild bibasal atelectasis and or infiltrate.  Patient is not hypoxic however she lives at a woman shelter and will not be able to return.  I have reached out to our social worker who is actively working on finding appropriate placement for patient.  Anticipate discharge to the [facility] along with symptomatic treatment and return precaution.    Social worker was also able to have pt's baby to go to [employer] under her care.    Cheryl Shorten  Fitzgerald was evaluated in Emergency Department on 10/18/2019 for the symptoms described in the history of present illness. She was evaluated in the context of the global COVID-19 pandemic, which  necessitated consideration that the patient might be at risk for infection with the SARS-CoV-2 virus that causes COVID-19. Institutional protocols and algorithms that pertain to the evaluation of patients at risk for COVID-19 are in a state of rapid change based on information released by regulatory bodies including the CDC and federal and state organizations. These policies and algorithms were followed during the patient's care in the ED.    Fayrene Helper, PA-C 10/18/19 2225    Arby Barrette, MD 10/20/19 [phone]

## 2019-10-19 ENCOUNTER — Encounter: Payer: Self-pay | Admitting: *Deleted

## 2019-10-19 ENCOUNTER — Telehealth: Payer: Self-pay | Admitting: Unknown Physician Specialty

## 2019-10-19 NOTE — Congregational Nurse Program (Signed)
  Dept: [phone]   Congregational Nurse Program Note  Date of Encounter: 10/12/19  Spoke with Dr. Shan Levans to arrange care for patient Cheryl Fitzgerald.  Dr. Delford Field advises patient be seen at [place] [facility] on Thursday, 10/12/19 as they will be able to treat her for respiratory complaints, cough complaints, complaints of yellow colored diarrhea and boil on inner right thigh.  I arranged with caseworker, Fredirick Lathe for patient to be transported via [employer] to [employer] on 5/6 and patient was advised to share complaints with Careers information officer.  Patient to be transported this afternoon to clinic for treatment for above symptoms.  Past Medical History: Past Medical History:  Diagnosis Date  . Adjustment disorder with mixed emotional features 03/23/2015  . Depression    no medication  . Shortness of breath dyspnea     Encounter Details: CNP Questionnaire - 10/11/19 1952      Questionnaire   Patient Status  Immigrant    Race  Hispanic or Latino    Location Patient Served At  [employer]  Not Applicable    Uninsured  Uninsured (Subsequent visits/quarter)    Food  No food insecurities    Housing/Utilities  No permanent housing    Transportation  Yes, need transportation assistance    Interpersonal Safety  Yes, feel physically and emotionally safe where you currently live    Medication  Yes, have medication insecurities    Medical Provider  No    Referrals  Behavioral/Mental Health Provider;[facility];Emergency Department    ED Visit Averted  Not Applicable    Life-Saving Intervention Made  Not Applicable

## 2019-10-19 NOTE — Telephone Encounter (Signed)
Called to discuss with [name] about Covid symptoms and the use of bamlanivimab, a monoclonal antibody infusion for those with mild to moderate Covid symptoms and at a high risk of hospitalization.     Pt is not qualified for this infusion due to lack of identified risk factors and co-morbid conditions.  Symptoms reviewed as well as criteria for ending isolation.  Symptoms reviewed that would warrant ED/Hospital evaluation as well should her condition worsen. Preventative practices reviewed. Patient verbalized understanding.  However needs a Child psychotherapist.  Will reach out.      Patient Active Problem List   Diagnosis Date Noted  . Pregnancy with abdominal pain of lower quadrant, antepartum 11/13/2018  . 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 Cellulitis of left arm   . Acquired hyperbilirubinemia   . Choledocholithiasis   . Non-specific filling defect of common bile duct   . Calculous cholecystitis with obstruction 08/08/2015  . 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  . Post-dates pregnancy 04/05/2015  . Adjustment disorder with mixed emotional features 03/23/2015  . Depression complicating pregnancy, antepartum 03/22/2015  . Supervision of high risk pregnancy due to social problems in third trimester 03/22/2015  . 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 Inadequate housing 03/22/2015  . Food hunger 03/22/2015  . Decreased fetal movement determined by examination 03/06/2015

## 2019-10-19 NOTE — Congregational Nurse Program (Signed)
  Dept: [phone]   Congregational Nurse Program Note  Date of Encounter: 10/19/2019  Patient transported to [facility]  ED on 10/18/19 where she tested positive for Novel Coronavirus.  Patient will quarantine for 10 days at hotel with infant son, Si Raider.  Patient will return to [facility] after quarantine.  Past Medical History: Past Medical History:  Diagnosis Date  . Adjustment disorder with mixed emotional features 03/23/2015  . Depression    no medication  . Shortness of breath dyspnea     Encounter Details: CNP Questionnaire - 10/19/19 2027      Questionnaire   Patient Status  Immigrant    Race  Hispanic or Latino    Location Patient Served At  [employer]  Not Applicable    Uninsured  Uninsured (NEW 1x/quarter)    Food  No food insecurities    Housing/Utilities  No permanent housing    Transportation  Yes, need transportation assistance    Interpersonal Safety  Yes, feel physically and emotionally safe where you currently live    Medication  Yes, have medication insecurities    Medical Provider  No    Referrals  [employer] officer, trade union;[facility]    ED Visit Averted  Not Applicable    Life-Saving Intervention Made  Not Applicable

## 2019-10-20 ENCOUNTER — Telehealth: Payer: Self-pay | Admitting: *Deleted

## 2019-10-20 ENCOUNTER — Encounter: Payer: Self-pay | Admitting: *Deleted

## 2019-10-23 ENCOUNTER — Telehealth: Payer: Self-pay | Admitting: *Deleted

## 2019-10-23 NOTE — Telephone Encounter (Signed)
Received call about bill and cancelled May 18th and May 21st transportation due to pt being in quarantine.

## 2019-10-23 NOTE — Telephone Encounter (Signed)
Left message that May 18th and May 21st rides had been canceled. Requested client call and let me know when her next appointments and scheduled.

## 2019-10-23 NOTE — Telephone Encounter (Signed)
Spoke with Rogelia Boga at [facility] who informed nurse that client had called ED to transport 51 month old infant son to ED because she was concenred he did not have enough urine output in the last 24 hours.

## 2019-10-23 NOTE — Telephone Encounter (Signed)
Spoke with Cheryl Sandy, RN to verify that patient has coronavirus and has been quarantined in hotel since evening of 10/18/19. Nurse Delford Field needed to cancel future appointments for patient.  Patient will reschedule.

## 2019-10-23 NOTE — Evaluation (Addendum)
  Spoke with Education officer, museum [employer] about client's move to hotel.   This note is not being shared with the patient for the following reason: To respect privacy (The patient or proxy has requested that the information not be shared).

## 2019-10-23 NOTE — Telephone Encounter (Signed)
Spoke with Dr. Lillie Columbia, CEO of [facility] to share with her that client did not report complaints of cough, respiratory distress, and diarrhea that had been reported to nurse on 10/10/19 when she went to [facility] on 10/12/19.  Client only reported to provider that she had  Boil on inner right thigh.  Patient was later transported to ED on 10/18/19 and diagnosed with coronovirus.Marland Kitchen

## 2019-10-23 NOTE — Telephone Encounter (Signed)
Talked with Dr. Delford Field about patient not revealing her Covid-19 symptoms at the mobile clinic visit on 10/12/19.

## 2019-10-23 NOTE — Telephone Encounter (Signed)
This encounter was created in error - please disregard.

## 2019-10-28 ENCOUNTER — Telehealth: Payer: Self-pay | Admitting: *Deleted

## 2019-10-28 ENCOUNTER — Encounter: Payer: Self-pay | Admitting: *Deleted

## 2019-10-28 NOTE — Congregational Nurse Program (Signed)
  Dept: [phone]   Congregational Nurse Program Note  Date of Encounter: 10/28/2019  Vision complaint:  Had spoken with patient several weeks ago and she reported that she was having difficulty seeing at distance.She reported that she wore prescription glasses until her sophomore year in high school, but they broke and she has not had glasses since.  Contacted Lyndle Herrlich, CN Prog who advised [facility] will provide glasses and exam for $70.  Contacted Fransisco Beau, CN Prog who will provide funds for patient out of [employer] fund. When patient returns from quarantine, will pursue appointment at [facility] for her.  Past Medical History: Past Medical History:  Diagnosis Date  . Adjustment disorder with mixed emotional features 03/23/2015  . Depression    no medication  . Shortness of breath dyspnea     Encounter Details: CNP Questionnaire - 10/28/19 2202      Questionnaire   Patient Status  Not Applicable    Race  Hispanic or Latino    Location Patient Served At  [employer]  Not Applicable    Uninsured  Uninsured (Subsequent visits/quarter)    Food  No food insecurities    Housing/Utilities  No permanent housing    Transportation  Yes, need transportation assistance    Interpersonal Safety  Yes, feel physically and emotionally safe where you currently live    Medication  Yes, have medication insecurities    Medical Provider  Yes    Referrals  Area Agency;Primary Care Provider/Clinic;Dental;Behavioral/Mental Health Provider;Vision    ED Visit Averted  Not Applicable    Life-Saving Intervention Made  Not Applicable

## 2019-10-28 NOTE — Telephone Encounter (Signed)
Spoke with Fransisco Beau, CN Director to request approval for funds to cover cost of patient's eye exam and glasses when she goes for exam.

## 2019-10-28 NOTE — Telephone Encounter (Signed)
Spoke with Lyndle Herrlich, CN Asst. Coord. To request referral agency that will see patent for eye exam at low cost.

## 2019-11-01 ENCOUNTER — Telehealth: Payer: Self-pay | Admitting: *Deleted

## 2019-11-01 ENCOUNTER — Encounter: Payer: Self-pay | Admitting: *Deleted

## 2019-11-01 NOTE — Telephone Encounter (Signed)
Client called while Probation officer at [employer]. Later met while client in person while at [facility]

## 2019-11-01 NOTE — Telephone Encounter (Signed)
Left message for client to return call once appointments had been made so transportation could be scheduled.

## 2019-11-01 NOTE — Congregational Nurse Program (Signed)
  Dept: [facility] Nurse Program Note  Date of Encounter: 11/01/2019  Past Medical History: Past Medical History:  Diagnosis Date  . Adjustment disorder with mixed emotional features 03/23/2015  . Depression    no medication  . Shortness of breath dyspnea     Encounter Details: CNP Questionnaire - 11/01/19 1357      Questionnaire   Patient Status  Not Applicable    Race  Hispanic or Latino    Location Patient Served At  [employer]  Not Applicable    Uninsured  Uninsured (NEW 1x/quarter)    Food  No food insecurities    Housing/Utilities  No permanent housing    Transportation  Yes, need transportation assistance    Interpersonal Safety  Yes, feel physically and emotionally safe where you currently live    Medication  Yes, have medication insecurities    Medical Provider  Yes    Referrals  Behavioral/Mental Health Provider    ED Visit Averted  Not Applicable    Life-Saving Intervention Made  Not Applicable      Met with client at [facility] as pt was requesting help with [facility] appointments and transportation. Client missed last two appointment due to quarantine from Covid. Went to [facility] and they would not allow Probation officer to make appt for client. Called client and asked that she make the appointment and call writer back so transportation could be arranged.

## 2019-11-22 ENCOUNTER — Telehealth: Payer: Self-pay | Admitting: *Deleted

## 2019-11-22 NOTE — Telephone Encounter (Signed)
Called client and explained CN could not provide services at this time. Client has phone number for [facility].

## 2019-11-22 NOTE — Telephone Encounter (Signed)
Client called requesting help with getting appt at [facility], medication refills and transportation. Client was not at recent appointment with CNs and pt advocate She has left [employer]. Client reports she is staying at [address] [place] [address]. Per client she is receiving financial help with rent from her mother. Per phone confirmation call to CN office, client no longer qualifies for assistance through CN program.

## 2020-03-27 ENCOUNTER — Telehealth: Payer: Self-pay | Admitting: *Deleted

## 2020-03-27 NOTE — Telephone Encounter (Signed)
Clarified with client that her appointment was with Dr. Hans Eden office rather than [facility]. Upon researching [place]  email communication between  CN and  client, CN sees that client had an appointment with eye doctor on February 26, 2020, but did not keep the appointment and rescheduled her appointment to March 27, 2020.  Client emailed  CN today and reported that due to an emergency, she could not keep the appointment today.  Client reported that she rescheduled today's appointment to May 08, 2020 @ 3:00 PM. CN emailed client to encourage her to keep that appointment. Assistant CN  Coordinator, Lyndle Herrlich has received grant funds to cover fees at Dr. Hans Eden office.  Client will contact CNs for payment when she keeps appointment with the eye doctor.

## 2020-03-27 NOTE — Telephone Encounter (Signed)
Emailed with client X 8.  Client emailed that she would not be keeping her appointment this afternoon at [facility]  due to an emergency.  She reported that she had rescheduled to December 1st at 3:00 PM. CN emailed client and copied supervisor to request that client email [place] the name of eye doctor and and the address of the office where she made the appointment.

## 2020-03-31 ENCOUNTER — Telehealth: Payer: Self-pay | Admitting: *Deleted

## 2020-03-31 NOTE — Telephone Encounter (Signed)
Telephoned Dr. [facility] Eden office and spoke with Thea Silversmith.  She reported that client missed appointment on 9/20 and 10/20 and has an upcoming appointment on 12/1.  She stated that if client does not come for appointment on 12/1, Dr. Hans Eden office will not be able to accommodate her for a future appointment.  Thea Silversmith stated that client was made aware of this policy. CN sent an email to client to remind her of the office policy and client acknowledged understanding of policy.

## 2020-05-28 ENCOUNTER — Other Ambulatory Visit: Payer: Self-pay

## 2020-05-28 ENCOUNTER — Ambulatory Visit: Payer: Self-pay

## 2020-05-28 ENCOUNTER — Ambulatory Visit: Payer: Self-pay | Attending: Nurse Practitioner | Admitting: Nurse Practitioner

## 2020-07-22 ENCOUNTER — Ambulatory Visit: Payer: Self-pay | Attending: Nurse Practitioner | Admitting: Nurse Practitioner

## 2020-07-22 ENCOUNTER — Other Ambulatory Visit: Payer: Self-pay

## 2020-07-24 ENCOUNTER — Other Ambulatory Visit: Payer: Self-pay

## 2020-07-24 ENCOUNTER — Ambulatory Visit: Payer: Self-pay | Attending: Nurse Practitioner | Admitting: Nurse Practitioner

## 2020-10-08 ENCOUNTER — Telehealth: Payer: Self-pay

## 2020-10-08 NOTE — Telephone Encounter (Signed)
Copied from CRM [id number]. Topic: General - Other >> Oct 07, 2020  5:14 PM Fitzgerald, Cheryl Glenn A wrote: Reason for CRM: Pt requested a call back from [place] to go over her application/ financial aid

## 2020-10-09 NOTE — Telephone Encounter (Signed)
I return Pt call and explain the what she need to have to apply for [employer]

## 2020-10-18 ENCOUNTER — Telehealth: Payer: Self-pay | Admitting: Nurse Practitioner

## 2020-10-18 NOTE — Telephone Encounter (Signed)
Patient called back asking why her appointment was changed to virtual. Explained to patient Cheryl Fitzgerald completes all her new patient appointments as virtuals. Patient was upset because she has transportation issues and she also has appointment with Mikle Bosworth 5/16 at 3pm. Explained to patient she should keep the virtual appointment with Cheryl Fitzgerald and while she is here she can get any lab work done if needed before or after her appt with Mikle Bosworth. Then her next visit with Cheryl Fitzgerald can be completed in person if she has transportation. Patient verbally understood and had no more questions.

## 2020-10-18 NOTE — Telephone Encounter (Signed)
Pt is calling back and would like to know the reason her appt is now virtual. Pt also has an appt with carlos. Pt had to find transportation. Pt states she needs to be seen in person and would like to see someone else on monday

## 2020-10-18 NOTE — Telephone Encounter (Signed)
Called patient and LVM advising her that her appointment for Monday had been switched to virtual due to provider request. Advised patient she does not need to come into the office, rather the provider will give her a call at her appointment time of 1:50pm. Advised patient if she had any questions or concerns to please give [place] a call at [phone].

## 2020-10-18 NOTE — Telephone Encounter (Signed)
Called patient and LVM advising her I was calling her back about her appointment virtual. Advised patient the reason her appointment is virtual is because the provider she is seeing Monday completes all her new patient appointments virtually- it is provider preference. Advised patient we would not have availability for another new patient appointment on Monday and if she would like to reschedule for an in person new patient appointment she would need to be rescheduled with a different provider because Zelda completes all of her new pt appointments virtual. Advised patient to call back with any further questions or concerns.

## 2020-10-21 ENCOUNTER — Ambulatory Visit: Payer: Self-pay | Attending: Nurse Practitioner | Admitting: Nurse Practitioner

## 2020-10-21 ENCOUNTER — Telehealth: Payer: Self-pay

## 2020-10-21 ENCOUNTER — Other Ambulatory Visit: Payer: Self-pay

## 2020-10-21 ENCOUNTER — Ambulatory Visit: Payer: Self-pay

## 2020-10-21 ENCOUNTER — Encounter: Payer: Self-pay | Admitting: Nurse Practitioner

## 2020-10-21 DIAGNOSIS — F4329 Adjustment disorder with other symptoms: Secondary | ICD-10-CM

## 2020-10-21 DIAGNOSIS — Z3042 Encounter for surveillance of injectable contraceptive: Secondary | ICD-10-CM

## 2020-10-21 DIAGNOSIS — Z7689 Persons encountering health services in other specified circumstances: Secondary | ICD-10-CM

## 2020-10-21 DIAGNOSIS — K089 Disorder of teeth and supporting structures, unspecified: Secondary | ICD-10-CM

## 2020-10-21 MED ORDER — AMOXICILLIN 500 MG PO CAPS
500.0000 mg | ORAL_CAPSULE | Freq: Three times a day (TID) | ORAL | 0 refills | Status: AC
  Filled 2020-10-21: qty 21, 7d supply, fill #0

## 2020-10-21 MED ORDER — MEDROXYPROGESTERONE ACETATE 150 MG/ML IM SUSP
150.0000 mg | Freq: Once | INTRAMUSCULAR | Status: AC
  Administered 2020-10-21: 150 mg via INTRAMUSCULAR

## 2020-10-21 MED ORDER — IBUPROFEN 800 MG PO TABS
800.0000 mg | ORAL_TABLET | Freq: Three times a day (TID) | ORAL | 2 refills | Status: AC | PRN
  Filled 2020-10-21: qty 60, 20d supply, fill #0

## 2020-10-21 MED ORDER — ESCITALOPRAM OXALATE 5 MG PO TABS
5.0000 mg | ORAL_TABLET | Freq: Every day | ORAL | 1 refills | Status: DC
  Filled 2020-10-21: qty 30, 30d supply, fill #0
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
## 2020-10-21 NOTE — Telephone Encounter (Signed)
Warm Handoff during today's office visit with for Financial Counseling. Patient shared her recent struggles with her youngest son being in Child Protective Custody and issues with housing. Nurse Manager engaged Case Manager to provide insight on commnuity resources. Case Manager discussed with the patient the limited resources to patients outside of [facility]. Patient shared her caseworker from [facility] DSS only offers a shelter to her. Patient did mention she has been to several of the short stay shelters in the area and is on the waiting list for [facility]. She says she is unable to work without having transportation also. Patient shared she was hungry and asking for a snack. Case Manager provided patient with a small snack while waiting for the nurse for a urine test.

## 2020-10-24 ENCOUNTER — Encounter: Payer: Self-pay | Admitting: Nurse Practitioner

## 2020-10-24 LAB — POCT URINE PREGNANCY: Preg Test, Ur: NEGATIVE

## 2020-10-25 NOTE — Telephone Encounter (Signed)
Pt called asking if someone can call her back asap.  Regarding financial services.

## 2020-10-25 NOTE — Telephone Encounter (Signed)
Forward to [place] please

## 2020-10-30 ENCOUNTER — Other Ambulatory Visit: Payer: Self-pay

## 2020-10-31 ENCOUNTER — Other Ambulatory Visit: Payer: Self-pay

## 2020-10-31 ENCOUNTER — Ambulatory Visit: Payer: Self-pay | Admitting: Physician Assistant

## 2020-10-31 ENCOUNTER — Other Ambulatory Visit (HOSPITAL_COMMUNITY): Payer: Self-pay

## 2020-10-31 ENCOUNTER — Ambulatory Visit: Payer: Self-pay | Admitting: *Deleted

## 2020-10-31 DIAGNOSIS — Z9049 Acquired absence of other specified parts of digestive tract: Secondary | ICD-10-CM

## 2020-10-31 DIAGNOSIS — F411 Generalized anxiety disorder: Secondary | ICD-10-CM

## 2020-10-31 DIAGNOSIS — K529 Noninfective gastroenteritis and colitis, unspecified: Secondary | ICD-10-CM

## 2020-10-31 MED ORDER — BUSPIRONE HCL 5 MG PO TABS
5.0000 mg | ORAL_TABLET | Freq: Two times a day (BID) | ORAL | 0 refills | Status: DC
  Filled 2020-10-31: qty 60, 30d supply, fill #0

## 2020-10-31 MED ORDER — CHOLESTYRAMINE LIGHT 4 G PO PACK
PACK | ORAL | 0 refills | Status: DC
  Filled 2020-10-31: qty 60, 30d supply, fill #0
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
## 2020-10-31 NOTE — Patient Instructions (Addendum)
I encourage you to start Prevalite once daily for the first week, then increase to twice a day to help you with your chronic diarrhea.  You also need to change your oral intake to help with the diarrhea.  I encourage you to more the lexapro to bedtime.  You can try the Buspar 5 mg twice a day to help with anxiety.  This is a medication that you take twice a day, not as needed.  Please feel free to return to the mobile unit next week for follow up.    Roney Jaffe, PA-C Physician Assistant [facility] Medicine [email or web address]    Food Choices to Help Relieve Diarrhea, Adult Diarrhea can make you feel weak and cause you to become dehydrated. It is important to choose the right foods and drinks to:  Relieve diarrhea.  Replace lost fluids and nutrients.  Prevent dehydration. What are tips for following this plan? Relieving diarrhea  Avoid foods that make your diarrhea worse. These may include: ? Foods and beverages sweetened with high-fructose corn syrup, honey, or sweeteners such as xylitol, sorbitol, and mannitol. ? Fried, greasy, or spicy foods. ? Raw fruits and vegetables.  Eat foods that are rich in probiotics. These include foods such as yogurt and fermented milk products. Probiotics can help increase healthy bacteria in your stomach and intestines (gastrointestinal tract or GI tract). This may help digestion and stop diarrhea.  If you have lactose intolerance, avoid dairy products. These may make your diarrhea worse.  Take medicine to help stop diarrhea only as told by your health care provider. Replacing nutrients  Eat bland, easy-to-digest foods in small amounts as you are able, until your diarrhea starts to get better. These foods include bananas, applesauce, rice, toast, and crackers.  Gradually reintroduce nutrient-rich foods as tolerated or as told by your health care provider. This includes: ? Well-cooked protein  foods, such as eggs, lean meats like fish or chicken without skin, and tofu. ? Peeled, seeded, and soft-cooked fruits and vegetables. ? Low-fat dairy products. ? Whole grains.  Take vitamin and mineral supplements as told by your health care provider.   Preventing dehydration  Start by sipping water or a solution to prevent dehydration (oral rehydration solution, ORS). This is a drink that helps replace fluids and minerals your body has lost. You can buy an ORS at pharmacies and retail stores.  Try to drink at least 8-10 cups (2,000-2,500 mL) of fluid each day to help replace lost fluids. If you have urine that is pale yellow, you are getting enough fluids.  You may drink other liquids in addition to water, such as fruit juice that you have added water to (diluted fruit juice) or low-calorie sports drinks, as tolerated or as told by your health care provider.  Avoid drinks with caffeine, such as coffee, tea, or soft drinks.  Avoid alcohol.   Summary  When you have diarrhea, it is important to choose the right foods and drinks to relieve diarrhea, to replace lost fluids and nutrients, and to prevent dehydration.  Make sure you drink enough fluid to keep your urine pale yellow.  You may benefit from eating bland foods at first. Gradually reintroduce healthy, nutrient-rich foods as tolerated or as told by your health care provider.  Avoid foods that make your diarrhea worse, such as fried, greasy, or spicy foods. This information is not intended to replace advice given to you by your health care provider. Make sure you discuss any questions you have  with your health care provider. Document Revised: 07/11/2019 Document Reviewed: 07/11/2019 Elsevier Patient Education  2021 [employer].

## 2020-10-31 NOTE — Progress Notes (Signed)
Patient verified DOB Patient has taken vitamins today. Patient eaten today. Patient reports HA last night and she took ibuprofen and tylenol. Patient denies pain but has discomfort in the abdomen.  Patient shares the lexapro makes her drowsy and she is not comfortable due to having to "walk the streets" during the day in that state.

## 2020-10-31 NOTE — Telephone Encounter (Signed)
Pt called stating she has anxiety and depression her entire life; the pt also says she is currently at a shelter; she denies suicidal or homicidal ideations; she would like some BH assistance; she was evaluated via virtual visit on 10/31/20; she was prescribed Lexapro on 10/21/20 but it made her droozy and dizzy; the pt is also taking Buspar; see visit note dated 10/31/20; the pt says that she was told by  [facility] that [facility] offers counseling like BH; recommendations made per nurse triage protocol; pt also given number to suicide hotline  [phone]; atempted to contact flow coordinator at [facility] [facility] x 3 without success; unable to schedule pt for appt because she received a call from her social worker; the pt can be contacted at [phone]; will route to office for patient contact and scheduling ; she is seen at [facility].   Reason for Disposition . [1] Started on anti-depressant medications < 2 weeks ago AND [2] not feeling any better  Answer Assessment - Initial Assessment Questions 1. CONCERN: "What happened that made you call today?"     Ongoing anxiety and depression entire life 2. DEPRESSION SYMPTOM SCREENING: "How are you feeling overall?" (e.g., decreased energy, increased sleeping or difficulty sleeping, difficulty concentrating, feelings of sadness, guilt, hopelessness, or worthlessness)    3. RISK OF HARM - SUICIDAL IDEATION:  "Do you ever have thoughts of hurting or killing yourself?"  (e.g., yes, no, no but preoccupation with thoughts about death)   - INTENT:  "Do you have thoughts of hurting or killing yourself right NOW?" (e.g., yes, no, N/A)   - PLAN: "Do you have a specific plan for how you would do this?" (e.g., gun, knife, overdose, no plan, N/A)    no* 4. RISK OF HARM - HOMICIDAL IDEATION:  "Do you ever have thoughts of hurting or killing someone else?"  (e.g., yes, no, no but preoccupation with thoughts about death)    - INTENT:  "Do you have thoughts of hurting or killing someone right NOW?" (e.g., yes, no, N/A)   - PLAN: "Do you have a specific plan for how you would do this?" (e.g., gun, knife, no plan, N/A)     no 5. FUNCTIONAL IMPAIRMENT: "How have things been going for you overall? Have you had more difficulty than usual doing your normal daily activities?"  (e.g., better, same, worse; self-care, school, work, interactions)     6. SUPPORT: "Who is with you now?" "Who do you live with?" "Do you have family or friends who you can talk to?"     no 7. THERAPIST: "Do you have a counselor or therapist? Name?"     no 8. STRESSORS: "Has there been any new stress or recent changes in your life?"     Things going on in life 9. ALCOHOL USE OR SUBSTANCE USE (DRUG USE): "Do you drink alcohol or use any illegal drugs?"      10. OTHER: "Do you have any other physical symptoms right now?" (e.g., fever)     11. PREGNANCY: "Is there any chance you are pregnant?" "When was your last menstrual period?"  Protocols used: DEPRESSION-A-AH

## 2020-11-05 ENCOUNTER — Telehealth: Payer: Self-pay | Admitting: Nurse Practitioner

## 2020-11-05 ENCOUNTER — Other Ambulatory Visit: Payer: Self-pay

## 2020-11-05 NOTE — Telephone Encounter (Signed)
Pt was sent a letter from financial dept. Inform them, that the application they submitted was incomplete, since they were missing some documentation at the time of the appointment, Pt need to reschedule and resubmit all new papers and application for CAFA and OC, P.S. old documents has been sent back by mail to the Pt and Pt. need to make a new appt. 
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
## 2020-11-06 ENCOUNTER — Telehealth: Payer: Self-pay | Admitting: Nurse Practitioner

## 2020-11-06 NOTE — Telephone Encounter (Signed)
Copied from CRM [id number]. Topic: General - Other >> Oct 31, 2020 12:18 PM Herby Abraham C wrote: Reason for CRM: pt called in for assistance. Pt would like a call back from [place] to discuss what else is needed to have financial assistance. Pt says that she is now living in a homeless shelter. Pt says that she was told to reach out to [place] to discuss further.

## 2020-11-07 NOTE — Telephone Encounter (Signed)
I return Pt call, schedule an appt for 11/18/20

## 2020-11-12 ENCOUNTER — Telehealth: Payer: Self-pay

## 2020-11-12 NOTE — Telephone Encounter (Signed)
Client had called to cancel transport to [employer] for today something came up. Rescheduled  For next week same time ,nurse gave approval

## 2020-11-18 ENCOUNTER — Other Ambulatory Visit: Payer: Self-pay

## 2020-11-18 ENCOUNTER — Ambulatory Visit: Payer: Self-pay | Attending: Nurse Practitioner

## 2020-11-19 ENCOUNTER — Telehealth: Payer: Self-pay | Admitting: Nurse Practitioner

## 2020-11-19 NOTE — Telephone Encounter (Signed)
I return Pt call, she will call back since she can't talk at this time

## 2020-11-19 NOTE — Telephone Encounter (Signed)
Copied from CRM [id number]. Topic: General - Other >> Nov 19, 2020  1:20 PM Jaquita Rector A wrote: Reason for CRM: Patient would like a call back from Mikle Bosworth can be reached at Ph# [phone]

## 2020-11-20 ENCOUNTER — Telehealth: Payer: Self-pay | Admitting: Nurse Practitioner

## 2020-11-20 NOTE — Telephone Encounter (Signed)
Copied from CRM [id number]. Topic: General - Other >> Nov 19, 2020 11:57 AM Jaquita Rector A wrote: Reason for CRM: Patient called in to inform she saw a GYN on 11/18/20 and ask Bertram Denver what also can she take that may help with her situation with the constant diarrhea that she have after eating. Patient is Living in a shelter and does not have the resources to keep up with her medication and say that she need to also see a GI Dr to further examination and decide what will be the best route to treat her. Patient also Say that her situation does not allow her to have the drink and other things that she may need to take her required medication  [phone].

## 2020-11-22 ENCOUNTER — Telehealth: Payer: Self-pay | Admitting: Nurse Practitioner
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
## 2020-11-22 NOTE — Telephone Encounter (Signed)
Patient advised to f/u with [facility] for ongoing GI issues.   Carlos please f/u with patient for OC/ CAFA application that was submitted by Case Manager at the shelter.

## 2020-11-26 NOTE — Telephone Encounter (Signed)
Still waiting for her to bring the missing documents

## 2020-11-27 ENCOUNTER — Telehealth: Payer: Self-pay | Admitting: Nurse Practitioner

## 2020-11-27 NOTE — Telephone Encounter (Signed)
Copied from CRM [id number]. Topic: General - Inquiry >> Nov 26, 2020 10:12 AM Aretta Nip wrote: Reason for CRM:pt thinks has all info in and wants a call from Mikle Bosworth re an appt  [phone]

## 2020-11-28 NOTE — Telephone Encounter (Signed)
I return Pt call, she was inform that I just sent the application to [facility] [employer] to be review hopping for the approval

## 2020-12-13 ENCOUNTER — Telehealth: Payer: Self-pay | Admitting: Nurse Practitioner

## 2020-12-13 ENCOUNTER — Encounter: Payer: Self-pay | Admitting: Nurse Practitioner

## 2020-12-13 NOTE — Telephone Encounter (Signed)
Pt called at 3:15p stating that she was on the way to her appt late due to transportation that was out of her control. Pt was told she was more than 10 mins late she would need to reschedule. Pt yelled at agent and stated that she waited 2 months for the appt and demanded that she was going to be be seen today. Please advise - Pt is on the way to the office.

## 2020-12-13 NOTE — Telephone Encounter (Signed)
Patient was rescheduled for July 13

## 2020-12-14 ENCOUNTER — Telehealth: Payer: Self-pay

## 2020-12-14 NOTE — Telephone Encounter (Signed)
TC to case manager at [employer] to check on status of client 12-12-2020.

## 2020-12-14 NOTE — Telephone Encounter (Signed)
After several missed calls between client and nurse finally reached each other. Client states a lot going on in her life right now ,too much ,to visit son today and has an appointment at [facility] States she is adjusting to her present placement since the center has been closed but doesn't like it . Has been going to the support group at the [facility] ,likes it but admit she became a little emotional as she was sharing her life experiences with the group and felt she was told she needs a Veterinary surgeon. Nurse a;allowed client to talk and ask that she speak with the leader of the support group on her next visit to clarify a statement made to her,plans are that she will. Received her second COVID vaccine as we had not talked since her appointment . Nurse will follow up with client next week

## 2020-12-14 NOTE — Telephone Encounter (Signed)
TC to check on client and how support group sessions at [facility] center is going Left message to call nurse 12-12-2020

## 2020-12-14 NOTE — Telephone Encounter (Signed)
Client had requested a ride to [employer] ,nurse approved

## 2020-12-18 ENCOUNTER — Other Ambulatory Visit: Payer: Self-pay

## 2020-12-18 ENCOUNTER — Ambulatory Visit: Payer: Self-pay | Attending: Nurse Practitioner | Admitting: Nurse Practitioner

## 2020-12-18 VITALS — BP 111/76 | HR 85 | Ht 66.5 in | Wt 244.0 lb

## 2020-12-18 DIAGNOSIS — F5104 Psychophysiologic insomnia: Secondary | ICD-10-CM

## 2020-12-18 DIAGNOSIS — F411 Generalized anxiety disorder: Secondary | ICD-10-CM

## 2020-12-18 DIAGNOSIS — F4329 Adjustment disorder with other symptoms: Secondary | ICD-10-CM

## 2020-12-18 DIAGNOSIS — Z0000 Encounter for general adult medical examination without abnormal findings: Secondary | ICD-10-CM

## 2020-12-18 DIAGNOSIS — L600 Ingrowing nail: Secondary | ICD-10-CM

## 2020-12-18 DIAGNOSIS — Z308 Encounter for other contraceptive management: Secondary | ICD-10-CM

## 2020-12-18 DIAGNOSIS — R197 Diarrhea, unspecified: Secondary | ICD-10-CM

## 2020-12-18 MED ORDER — MELATONIN MAXIMUM STRENGTH 5 MG PO TABS
5.0000 mg | ORAL_TABLET | Freq: Every day | ORAL | 1 refills | Status: DC
  Filled 2020-12-18: qty 90, 90d supply, fill #0

## 2020-12-18 MED ORDER — BUSPIRONE HCL 5 MG PO TABS
5.0000 mg | ORAL_TABLET | Freq: Two times a day (BID) | ORAL | 1 refills | Status: DC
  Filled 2020-12-18: qty 60, 30d supply, fill #0

## 2020-12-18 MED ORDER — ESCITALOPRAM OXALATE 5 MG PO TABS
5.0000 mg | ORAL_TABLET | Freq: Every day | ORAL | 1 refills | Status: DC
  Filled 2020-12-18: qty 100, 100d supply, fill #0

## 2020-12-18 MED ORDER — MUPIROCIN 2 % EX OINT
1.0000 "application " | TOPICAL_OINTMENT | Freq: Two times a day (BID) | CUTANEOUS | 1 refills | Status: DC
  Filled 2020-12-18: qty 22, 11d supply, fill #0

## 2020-12-18 NOTE — Progress Notes (Signed)
Assessment & Plan:  Cheryl Fitzgerald was seen today for annual exam.  Diagnoses and all orders for this visit:  Annual physical exam  Adjustment disorder with mixed emotional features -     escitalopram (LEXAPRO) 5 MG tablet; Take 1 tablet (5 mg total) by mouth daily. -     Ambulatory referral to Integrated Behavioral Health  Generalized anxiety disorder -     busPIRone (BUSPAR) 5 MG tablet; Take 1 tablet (5 mg total) by mouth 2 (two) times daily. -     Ambulatory referral to Integrated Behavioral Health  Ingrown toenail -     Ambulatory referral to Podiatry -     mupirocin ointment (BACTROBAN) 2 %; Apply 1 application topically 2 (two) times daily.  Encounter for other contraceptive management -     Ambulatory referral to Gynecology  Diarrhea, unspecified type -     Cdiff NAA+O+P+Stool Culture  Psychophysiological insomnia -     MELATONIN MAXIMUM STRENGTH 5 MG TABS; Take 1 tablet (5 mg total) by mouth at bedtime.   Patient has been counseled on age-appropriate routine health concerns for screening and prevention. These are reviewed and up-to-date. Referrals have been placed accordingly. Immunizations are up-to-date or declined.    Subjective:   Chief Complaint  Patient presents with   Annual Exam   HPI Cheryl Fitzgerald 25 y.o. female presents to office today for annual exam  She has an ingrown toenail on both big toes. There is tenderness to palpation however there is no active drainage or noticeable erythema around the nailbeds.  She endorses chronic intermittent diarrhea of unknown etiology.  Currently taking cholestyramine.  She does have a history of cholecystectomy and diet is very high in fat.  She would like to be referred to gynecology for long-term birth control options including Nexplanon or IUD.   History of adjustment order with anxiety and depression.  Needs referral to psychiatry for medication management and counseling.  Currently denies any thoughts of self-harm.   Associated symptoms include insomnia Depression screen [facility] 2/9 12/18/2020 10/31/2020 10/21/2020  Decreased Interest 1 2 3   Down, Depressed, Hopeless 2 1 3   PHQ - 2 Score 3 3 6   Altered sleeping 2 2 2   Tired, decreased energy 1 2 1   Change in appetite 2 2 2   Feeling bad or failure about yourself  1 1 3   Trouble concentrating 0 0 0  Moving slowly or fidgety/restless 0 0 0  Suicidal thoughts 0 0 0  PHQ-9 Score 9 10 14     Review of Systems  Constitutional:  Negative for fever, malaise/fatigue and weight loss.  HENT: Negative.  Negative for nosebleeds.   Eyes: Negative.  Negative for blurred vision, double vision and photophobia.  Respiratory: Negative.  Negative for cough and shortness of breath.   Cardiovascular: Negative.  Negative for chest pain, palpitations and leg swelling.  Gastrointestinal:  Positive for diarrhea. Negative for abdominal pain, blood in stool, constipation, heartburn, melena, nausea and vomiting.  Genitourinary: Negative.   Musculoskeletal: Negative.  Negative for myalgias.  Skin:        See HPI  Neurological: Negative.  Negative for dizziness, focal weakness, seizures and headaches.  Endo/Heme/Allergies: Negative.   Psychiatric/Behavioral:  Positive for depression. Negative for suicidal ideas. The patient is nervous/anxious and has insomnia.    Past Medical History:  Diagnosis Date   Adjustment disorder with mixed emotional features 03/23/2015   Depression    no medication   Shortness of breath dyspnea  Past Surgical History:  Procedure Laterality Date   CHOLECYSTECTOMY N/A 08/08/2015   Procedure: LAPAROSCOPIC CHOLECYSTECTOMY WITH INTRAOPERATIVE CHOLANGIOGRAM;  Surgeon: Leafy Ro, MD;  Location: [facility] ORS;  Service: General;  Laterality: N/A;   ENDOSCOPIC RETROGRADE CHOLANGIOPANCREATOGRAPHY (ERCP) WITH PROPOFOL N/A 08/09/2015   Procedure: ENDOSCOPIC RETROGRADE CHOLANGIOPANCREATOGRAPHY (ERCP) WITH PROPOFOL;  Surgeon: Midge Minium, MD;  Location: [facility]  ENDOSCOPY;  Service: Endoscopy;  Laterality: N/A;   NO PAST SURGERIES     VAGINAL DELIVERY  04/07/2015    Family History  Problem Relation Age of Onset   Diabetes Mother    Hyperlipidemia Mother    Hypertension Mother    Arthritis Father    Depression Father    Breast cancer Maternal Grandmother    Diabetes Maternal Grandmother     Social History Reviewed with no changes to be made today.   Outpatient Medications Prior to Visit  Medication Sig Dispense Refill   Multiple Vitamin (MULTIVITAMIN) tablet Take 1 tablet by mouth daily.     MELATONIN MAXIMUM STRENGTH 5 MG TABS Take 5 mg by mouth at bedtime.     cholestyramine light (PREVALITE) 4 g packet Take 1 packet (4 g total) by mouth daily for 7 days, THEN 1 packet (4 g total) 2 (two) times daily 60 packet 0   busPIRone (BUSPAR) 5 MG tablet Take 1 tablet (5 mg total) by mouth 2 (two) times daily. (Patient not taking: Reported on 12/18/2020) 60 tablet 0   escitalopram (LEXAPRO) 5 MG tablet Take 1 tablet (5 mg total) by mouth daily. (Patient not taking: Reported on 10/31/2020) 30 tablet 1   No facility-administered medications prior to visit.    Allergies  Allergen Reactions   Peanut Oil Other (See Comments)   Other        Objective:    BP 111/76   Pulse 85   Ht 5' 6.5" (1.689 m)   Wt 244 lb (110.7 kg)   SpO2 97%   BMI 38.79 kg/m  Wt Readings from Last 3 Encounters:  12/18/20 244 lb (110.7 kg)  11/13/18 195 lb (88.5 kg)  07/12/17 185 lb (83.9 kg)    Physical Exam       Patient has been counseled extensively about nutrition and exercise as well as the importance of adherence with medications and regular follow-up. The patient was given clear instructions to go to ER or return to medical center if symptoms don't improve, worsen or new problems develop. The patient verbalized understanding.   Follow-up: Return if symptoms worsen or fail to improve.   Claiborne Rigg, FNP-BC [facility]  [place], [place] [phone]   12/20/2020, 4:57 PM

## 2020-12-18 NOTE — Progress Notes (Signed)
DIARRHEA INGROWN TOENAIL SOB

## 2020-12-19 ENCOUNTER — Other Ambulatory Visit: Payer: Self-pay

## 2020-12-19 ENCOUNTER — Telehealth: Payer: Self-pay | Admitting: Nurse Practitioner

## 2020-12-19 NOTE — Telephone Encounter (Signed)
Patient came by with concerns about having a lot of boils in her mouth that was not addressed at her last appt. Patient was given the dental resource list but asked if Zelda could give her a call. She also preferred to have a referral to a dentist if possible.

## 2020-12-20 ENCOUNTER — Telehealth: Payer: Self-pay | Admitting: Clinical

## 2020-12-20 ENCOUNTER — Encounter: Payer: Self-pay | Admitting: Nurse Practitioner

## 2020-12-20 NOTE — Telephone Encounter (Signed)
Patient does not have insurance she will have to use the dental listing given to her.

## 2020-12-20 NOTE — Telephone Encounter (Signed)
Contacted pt and scheduled appt per [facility] referral from PCP for 12/25/20 at 1:30PM.

## 2020-12-25 ENCOUNTER — Ambulatory Visit: Payer: Self-pay | Attending: Nurse Practitioner | Admitting: Clinical

## 2020-12-25 ENCOUNTER — Other Ambulatory Visit: Payer: Self-pay

## 2020-12-25 ENCOUNTER — Encounter: Payer: Self-pay | Admitting: Nurse Practitioner

## 2020-12-25 DIAGNOSIS — T1490XA Injury, unspecified, initial encounter: Secondary | ICD-10-CM

## 2020-12-25 DIAGNOSIS — F4323 Adjustment disorder with mixed anxiety and depressed mood: Secondary | ICD-10-CM

## 2020-12-25 LAB — CDIFF NAA+O+P+STOOL CULTURE
E coli, Shiga toxin Assay: NEGATIVE
Toxigenic C. Difficile by PCR: NEGATIVE

## 2020-12-26 ENCOUNTER — Encounter: Payer: Self-pay | Admitting: Podiatry

## 2020-12-26 ENCOUNTER — Other Ambulatory Visit: Payer: Self-pay | Admitting: Nurse Practitioner

## 2020-12-26 ENCOUNTER — Ambulatory Visit (INDEPENDENT_AMBULATORY_CARE_PROVIDER_SITE_OTHER): Payer: Self-pay | Admitting: Podiatry

## 2020-12-26 DIAGNOSIS — R195 Other fecal abnormalities: Secondary | ICD-10-CM

## 2020-12-26 DIAGNOSIS — L600 Ingrowing nail: Secondary | ICD-10-CM

## 2020-12-26 NOTE — Patient Instructions (Signed)

## 2020-12-26 NOTE — Progress Notes (Signed)
Subjective:   Patient ID: Cheryl Fitzgerald, female   DOB: 25 y.o.   MRN: [id number]   HPI Patient states she has painful ingrown toenails of both big toes and states that she is very nervous and she does not have resources to be able to take care of this if we want to do anything permanent.  Patient has been using a ointment but that its not been helping her and does not smoke and is not currently active   Review of Systems  All other systems reviewed and are negative.      Objective:  Physical Exam Vitals and nursing note reviewed.  Constitutional:      Appearance: She is well-developed.  Pulmonary:     Effort: Pulmonary effort is normal.  Musculoskeletal:        General: Normal range of motion.  Skin:    General: Skin is warm.  Neurological:     Mental Status: She is alert.    Neurovascular status was found to be intact muscle strength was found to be adequate.  The lateral border of each hallux nail is incurvated and it is sore when pressed there is no active drainage no redness noted they are simply tender secondary to the structure of the nailbed.  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  I do not think she is in a housing situation where I can do permanent procedures even though long-term that would be to her best benefit.  I want to try to give her relief of her symptoms and I did go ahead today I anesthetized each hallux 60 mg like Marcaine mixture sterile prep applied to the toes and using sterile instrumentation I removed the lateral border in a slant fashion to his to reduce the pressure against the adjacent skin.  This seemed to do well for her I applied sterile dressings she was quite emotional afterwards and we did the best we could to calm her down and to try to help her to the best of our ability.  We did give her as  much material as we could to soak these and we did give her the resource to hopefully get some ibuprofen to help her with any discomfort she may have

## 2020-12-27 ENCOUNTER — Other Ambulatory Visit: Payer: Self-pay

## 2020-12-27 ENCOUNTER — Encounter (HOSPITAL_COMMUNITY): Payer: Self-pay

## 2020-12-27 ENCOUNTER — Ambulatory Visit (HOSPITAL_COMMUNITY)
Admission: EM | Admit: 2020-12-27 | Discharge: 2020-12-27 | Disposition: A | Discharge status: Home / Self Care | Payer: Self-pay | Attending: Emergency Medicine | Admitting: Emergency Medicine

## 2020-12-27 DIAGNOSIS — M79675 Pain in left toe(s): Secondary | ICD-10-CM

## 2020-12-27 DIAGNOSIS — M79674 Pain in right toe(s): Secondary | ICD-10-CM

## 2020-12-27 MED ORDER — ACETAMINOPHEN 325 MG PO TABS
ORAL_TABLET | ORAL | Status: AC
  Filled 2020-12-27: qty 3

## 2020-12-27 MED ORDER — BACITRACIN ZINC 500 UNIT/GM EX OINT
TOPICAL_OINTMENT | CUTANEOUS | Status: AC
  Filled 2020-12-27: qty 28.35

## 2020-12-27 MED ORDER — ACETAMINOPHEN 325 MG PO TABS
975.0000 mg | ORAL_TABLET | Freq: Once | ORAL | Status: AC
  Administered 2020-12-27: 975 mg via ORAL

## 2020-12-27 NOTE — ED Triage Notes (Signed)
Pt reports bilateral excruciated pain big toe after nail toe removal yesterday.

## 2020-12-27 NOTE — Discharge Instructions (Addendum)
Unfortunately we do not have any samples of meds or epsom salts, we have given you 2 disposable pans in effort to help w your recommended foot soaks by podiatrist. Rest,elevate feet. Please follow discharge instructions of podiatrist. May use tylenol 975 mg every 6 hours and alternate with ibuprofen 800 mg every 8 hours for couple of days. Elevation of feet will help.  Please contact your podiatrist group for further management and care.

## 2020-12-27 NOTE — ED Provider Notes (Signed)
[facility]    CSN: [id number] Arrival date & time: 12/27/20  1714      History   Chief Complaint Chief Complaint  Patient presents with   Foot Problem    HPI Cheryl Fitzgerald is a 25 y.o. female.   25 year old female patient presents to emergency room complaining of bilateral toe pain from ingrown toenail removal.  Patient requesting narcotics states she has taken ibuprofen and it is not helping does not have any Tylenol.  Offered patient Tylenol she says it does not work she needs pain medicine.  Discussed with patient at length she will need to follow-up with her surgeon that performed the procedure yesterday.  Recommend elevation , ice , alternating Tylenol . ibuprofen.  Patient states she is homeless and lives in a hotel at present.   The history is provided by the patient. No language interpreter was used.   Past Medical History:  Diagnosis Date   Adjustment disorder with mixed emotional features 03/23/2015   Depression    no medication   Shortness of breath dyspnea     Patient Active Problem List   Diagnosis Date Noted   Toe pain, bilateral 12/27/2020   Chronic diarrhea 10/31/2020   Generalized anxiety disorder 10/31/2020   History of cholecystectomy 10/31/2020   Axillary hidradenitis suppurativa 11/29/2016   Cellulitis of left arm    Acquired hyperbilirubinemia    Choledocholithiasis    Non-specific filling defect of common bile duct    Calculous cholecystitis with obstruction 08/08/2015   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   Post-dates pregnancy 04/05/2015   Adjustment disorder with mixed emotional features 03/23/2015   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   Inadequate housing 03/22/2015   Food hunger 03/22/2015    Past Surgical History:  Procedure Laterality Date   CHOLECYSTECTOMY N/A 08/08/2015   Procedure: LAPAROSCOPIC CHOLECYSTECTOMY WITH INTRAOPERATIVE CHOLANGIOGRAM;  Surgeon: Leafy Ro, MD;  Location: [facility] ORS;   Service: General;  Laterality: N/A;   ENDOSCOPIC RETROGRADE CHOLANGIOPANCREATOGRAPHY (ERCP) WITH PROPOFOL N/A 08/09/2015   Procedure: ENDOSCOPIC RETROGRADE CHOLANGIOPANCREATOGRAPHY (ERCP) WITH PROPOFOL;  Surgeon: Midge Minium, MD;  Location: [facility] ENDOSCOPY;  Service: Endoscopy;  Laterality: N/A;   NO PAST SURGERIES     VAGINAL DELIVERY  04/07/2015    OB History     Gravida  2   Para  1   Term  1   Preterm      AB  0   Living  1      SAB  0   IAB      Ectopic      Multiple  0   Live Births  1            Home Medications    Prior to Admission medications   Medication Sig Start Date End Date Taking? Authorizing Provider  busPIRone (BUSPAR) 5 MG tablet Take 1 tablet (5 mg total) by mouth 2 (two) times daily. 12/18/20 03/18/21  Claiborne Rigg, NP  cholestyramine light (PREVALITE) 4 g packet Take 1 packet (4 g total) by mouth daily for 7 days, THEN 1 packet (4 g total) 2 (two) times daily 10/31/20 11/30/20  Mayers, Cari S, PA-C  escitalopram (LEXAPRO) 5 MG tablet Take 1 tablet (5 mg total) by mouth daily. 12/18/20 03/29/21  Claiborne Rigg, NP  MELATONIN MAXIMUM STRENGTH 5 MG TABS Take 1 tablet (5 mg total) by mouth at bedtime. 12/18/20 03/18/21  Claiborne Rigg, NP  Multiple Vitamin (MULTIVITAMIN) tablet Take 1 tablet by  mouth daily.    [provider]  mupirocin ointment (BACTROBAN) 2 % Apply 1 application topically 2 (two) times daily. 12/18/20   Claiborne Rigg, NP    Family History Family History  Problem Relation Age of Onset   Diabetes Mother    Hyperlipidemia Mother    Hypertension Mother    Arthritis Father    Depression Father    Breast cancer Maternal Grandmother    Diabetes Maternal Grandmother     Social History Social History   Tobacco Use   Smoking status: Former    Packs/day: 0.10    Types: Cigarettes   Smokeless tobacco: Never  Substance Use Topics   Alcohol use: Not Currently   Drug use: Not Currently    Types: Marijuana     Comment: patient states that she is unsure if her marijuana has been "laced". recent use 3 days ago     Allergies   Gramineae pollens, Peanut oil, and Other   Review of Systems Review of Systems  Constitutional:  Negative for fever.  Skin:  Positive for wound. Negative for color change.  All other systems reviewed and are negative.   Physical Exam Triage Vital Signs ED Triage Vitals [12/27/20 1748]  Enc Vitals Group     BP      Pulse      Resp      Temp      Temp src      SpO2      Weight      Height      Head Circumference      Peak Flow      Pain Score 10     Pain Loc      Pain Edu?      Excl. in GC?    No data found.  Updated Vital Signs BP (!) 144/95 (BP Location: Right Arm)   Pulse 60   Temp 98.4 F (36.9 C) (Oral)   Resp 17   SpO2 98%   Breastfeeding No   Visual Acuity Right Eye Distance:   Left Eye Distance:   Bilateral Distance:    Right Eye Near:   Left Eye Near:    Bilateral Near:     Physical Exam Vitals and nursing note reviewed.  Cardiovascular:     Pulses: Normal pulses.          Popliteal pulses are 2+ on the right side and 2+ on the left side.  Skin:    Comments: No appreciable swelling or streaking noted patient has bilateral great toes wrapped from recent procedure, no drainage noted on bandage  Neurological:     Mental Status: She is alert.  Psychiatric:        Attention and Perception: Attention normal.        Mood and Affect: Affect is angry.        Speech: Speech is rapid and pressured.        Behavior: Behavior is agitated.     UC Treatments / Results  Labs (all labs ordered are listed, but only abnormal results are displayed) Labs Reviewed - No data to display  EKG   Radiology No results found.  Procedures Procedures (including critical care time)  Medications Ordered in UC Medications  acetaminophen (TYLENOL) tablet 975 mg (975 mg Oral Given 12/27/20 1814)    Initial Impression / Assessment and Plan / UC  Course  I have reviewed the triage vital signs and the nursing notes.  Pertinent labs &  imaging results that were available during my care of the patient were reviewed by me and considered in my medical decision making (see chart for details).     DDX: Toe pain, post procedure pain, homelessness  Discussed with patient resources available, offered patient Tylenol, supplies ,[employer] number for possible available services. follow-up with your surgeon at [facility].  Rest, elevate feet as much as possible to help with pain , may alternate Tylenol ibuprofen as label directed .will need to contact surgeon for additional pain management options . patient verbalized understanding to this provider Final Clinical Impressions(s) / UC Diagnoses   Final diagnoses:  Toe pain, bilateral     Discharge Instructions      Unfortunately we do not have any samples of meds or epsom salts, we have given you 2 disposable pans in effort to help w your recommended foot soaks by podiatrist. Rest,elevate feet. Please follow discharge instructions of podiatrist. May use tylenol 975 mg every 6 hours and alternate with ibuprofen 800 mg every 8 hours for couple of days. Elevation of feet will help.  Please contact your podiatrist group for further management and care.     ED Prescriptions   None    PDMP not reviewed this encounter.   Clancy Gourd, NP 12/27/20 1836
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
## 2020-12-31 ENCOUNTER — Telehealth: Payer: Self-pay

## 2020-12-31 NOTE — Telephone Encounter (Signed)
CM faxed over Referral to [facility] on 12/26/20

## 2021-01-02 ENCOUNTER — Telehealth: Payer: Self-pay | Admitting: Nurse Practitioner

## 2021-01-02 ENCOUNTER — Telehealth (INDEPENDENT_AMBULATORY_CARE_PROVIDER_SITE_OTHER): Payer: Self-pay

## 2021-01-02 NOTE — Telephone Encounter (Signed)
Copied from CRM [id number]. Topic: General - Other >> Jan 02, 2021  2:56 PM Marylen Ponto wrote: Reason for CRM: Pt cancelled appt for depo shot and requests call back to discuss other birth control options.

## 2021-01-02 NOTE — Telephone Encounter (Signed)
Copied from CRM [id number]. Topic: General - Inquiry >> Dec 23, 2020  2:59 PM Daphine Deutscher D wrote: Reason for CRM: Pt called saying her big toenail is coming off and she would like someone to call her back.  She does not have transportation to an UR but wants to know what should she do.  CB#  [phone]

## 2021-01-03 NOTE — Telephone Encounter (Signed)
Pt has been called and a VM was left also pt was sent a mychart message regarding questions.

## 2021-01-06 ENCOUNTER — Ambulatory Visit: Payer: Self-pay

## 2021-01-06 ENCOUNTER — Telehealth (INDEPENDENT_AMBULATORY_CARE_PROVIDER_SITE_OTHER): Payer: Self-pay

## 2021-01-06 NOTE — Telephone Encounter (Signed)
Copied from CRM [id number]. Topic: General - Other >> Jan 06, 2021  1:45 PM Traci Sermon wrote: Reason for CRM: Gracey from [facility] services called in wanting to speak with Idalia Needle pertaining this pt

## 2021-01-09 ENCOUNTER — Other Ambulatory Visit: Payer: Self-pay

## 2021-01-09 ENCOUNTER — Encounter: Payer: Self-pay | Admitting: Sports Medicine

## 2021-01-09 ENCOUNTER — Ambulatory Visit (INDEPENDENT_AMBULATORY_CARE_PROVIDER_SITE_OTHER): Payer: No Typology Code available for payment source | Admitting: Sports Medicine

## 2021-01-09 DIAGNOSIS — Z9889 Other specified postprocedural states: Secondary | ICD-10-CM

## 2021-01-09 DIAGNOSIS — M79675 Pain in left toe(s): Secondary | ICD-10-CM

## 2021-01-09 DIAGNOSIS — M79674 Pain in right toe(s): Secondary | ICD-10-CM

## 2021-01-09 MED ORDER — TRAMADOL HCL 50 MG PO TABS
50.0000 mg | ORAL_TABLET | Freq: Three times a day (TID) | ORAL | 0 refills | Status: AC | PRN
  Filled 2021-01-09: qty 15, 5d supply, fill #0

## 2021-01-09 MED ORDER — NEOMYCIN-POLYMYXIN-HC 3.5-10000-1 OT SOLN
OTIC | 0 refills | Status: DC
  Filled 2021-01-09: qty 10, 16d supply, fill #0

## 2021-01-09 NOTE — Telephone Encounter (Signed)
Returned call to CPS, for [employer], no answer, left vm.

## 2021-01-09 NOTE — Progress Notes (Signed)
Subjective: Cheryl Fitzgerald is a 25 y.o. female patient returns to office today for follow up evaluation after having Right and left Hallux lateral nail avulsion performed on 12/26/2020. Patient has been soaking using epsom salt and applying topical antibiotic covered with bandaid daily but has not been doing so over the last few days states that has been very busy in her household has been unable to do it consistently.  Patient admits to significant pain at the right great toe and is worried about the pain and it not healing correctly. Patient deniesfever/chills/nausea/vomitting/any other related constitutional symptoms at this time.  Patient Active Problem List   Diagnosis Date Noted   Toe pain, bilateral 12/27/2020   Chronic diarrhea 10/31/2020   Generalized anxiety disorder 10/31/2020   History of cholecystectomy 10/31/2020   Axillary hidradenitis suppurativa 11/29/2016   Cellulitis of left arm    Acquired hyperbilirubinemia    Choledocholithiasis    Non-specific filling defect of common bile duct    Calculous cholecystitis with obstruction 08/08/2015   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   Post-dates pregnancy 04/05/2015   Adjustment disorder with mixed emotional features 03/23/2015   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   Inadequate housing 03/22/2015   Food hunger 03/22/2015    Current Outpatient Medications on File Prior to Visit  Medication Sig Dispense Refill   busPIRone (BUSPAR) 5 MG tablet Take 1 tablet (5 mg total) by mouth 2 (two) times daily. 180 tablet 1   cholestyramine light (PREVALITE) 4 g packet Take 1 packet (4 g total) by mouth daily for 7 days, THEN 1 packet (4 g total) 2 (two) times daily 60 packet 0   escitalopram (LEXAPRO) 5 MG tablet Take 1 tablet (5 mg total) by mouth daily. 90 tablet 1   MELATONIN MAXIMUM STRENGTH 5 MG TABS Take 1 tablet (5 mg total) by mouth at bedtime. 90 tablet 1   Multiple Vitamin (MULTIVITAMIN) tablet Take 1 tablet  by mouth daily.     mupirocin ointment (BACTROBAN) 2 % Apply 1 application topically 2 (two) times daily. 22 g 1   No current facility-administered medications on file prior to visit.    Allergies  Allergen Reactions   Gramineae Pollens Itching   Peanut Oil Other (See Comments)   Other     Objective:  General: Well developed, nourished, in no acute distress, alert and oriented x3 patient is noted to be very anxious during this encounter and was concerned about how she was treated at her last office visit  Dermatology: Skin is warm, dry and supple bilateral.  Right and left hallux lateral nail bed appears to be clean, dry, with mild granular tissue and surrounding eschar/scab. (-) Erythema. (+) Edema. (-) serosanguous drainage present. The remaining nails appear unremarkable at this time. There are no other lesions or other signs of infection  present.  Neurovascular status: Intact. No lower extremity swelling; No pain with calf compression bilateral.  Musculoskeletal: There is tenderness to the right greater than left lateral hallux nail fold. Muscular strength within normal limits bilateral.   Assesement and Plan: Problem List Items Addressed This Visit       Other   Toe pain, bilateral   Other Visit Diagnoses     Status post nail surgery    -  Primary       -Examined patient  -Cleansed right/left hallux lateral nail fold and gently scrubbed with peroxide and q-tip/curetted away eschar at site and applied antibiotic cream covered with bandaid.  -Discussed plan  of care with patient. -Patient to May discontinue soaking and apply Corticosporin after a bath or shower as directed -Continue with good supportive shoes that do not rub or irritate the toes -For this time only gave patient tramadol to help with pain -Educated patient on long term care after nail surgery. -Patient was instructed to monitor the toe for reoccurrence and signs of infection; Patient advised to return to  office or go to ER if toe becomes red, hot or swollen. -Patient is to return in 3 weeks for final nail check or sooner if problems arise.  Asencion Islam, DPM

## 2021-01-10 ENCOUNTER — Other Ambulatory Visit: Payer: Self-pay

## 2021-01-15 ENCOUNTER — Telehealth: Payer: Self-pay

## 2021-01-15 ENCOUNTER — Other Ambulatory Visit: Payer: Self-pay

## 2021-01-15 ENCOUNTER — Telehealth: Payer: Self-pay | Admitting: Nurse Practitioner

## 2021-01-15 ENCOUNTER — Ambulatory Visit: Payer: Self-pay | Attending: Family Medicine | Admitting: Clinical

## 2021-01-15 DIAGNOSIS — F4323 Adjustment disorder with mixed anxiety and depressed mood: Secondary | ICD-10-CM

## 2021-01-15 NOTE — Telephone Encounter (Signed)
Following up on referral for a [employer]. CM reached out to [employer] to check on the status of the patients referral. [employer] was unable to provide CST services to the patient due to them being uninsured.

## 2021-01-15 NOTE — Telephone Encounter (Signed)
Copied from CRM [id number]. Topic: General - Other >> Jan 08, 2021  2:15 PM Gwenlyn Fudge wrote: Reason for CRM: Berline Lopes, from [facility] CPS, calling to reach [employer]. Attempted to transfer her over to office x3 with no response. Please advise.
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
## 2021-01-20 NOTE — Telephone Encounter (Signed)
I attempted to call Charlene with [facility] CPS with the number you provided me before [phone]. If Gracie calls back and I am unavailable can you get a direct contact please?

## 2021-01-21 NOTE — Telephone Encounter (Signed)
Yes, if she returns the call I will verify her correct number.

## 2021-01-30 ENCOUNTER — Ambulatory Visit: Payer: No Typology Code available for payment source | Admitting: Sports Medicine

## 2021-02-03 ENCOUNTER — Telehealth: Payer: Self-pay | Admitting: Nurse Practitioner

## 2021-02-03 ENCOUNTER — Ambulatory Visit: Payer: Self-pay | Admitting: Clinical

## 2021-02-03 NOTE — Telephone Encounter (Signed)
I attempted to call this pt to reschedule this appt. No answer, left vm

## 2021-02-03 NOTE — Telephone Encounter (Signed)
Copied from CRM [id number]. Topic: Appointment Scheduling - Scheduling Inquiry for Clinic >> Feb 03, 2021  1:20 PM Randol Kern wrote: Reason for CRM: Pt needs to reschedule her appt today with [facility]   Best contact: [phone]

## 2021-02-07 NOTE — Telephone Encounter (Signed)
Second attempt to reach pt, no answer, left vm.

## 2021-02-11 ENCOUNTER — Telehealth: Payer: Self-pay

## 2021-02-11 NOTE — Telephone Encounter (Signed)
Patient called and left VM on nurse line requesting assistance cancelling appt with our office on Thursday, 02/13/21. Called pt; VM left stating I am returning pt's call, requested pt call back for assistance.

## 2021-02-12 ENCOUNTER — Encounter: Payer: Self-pay | Admitting: Nurse Practitioner

## 2021-02-14 NOTE — Telephone Encounter (Signed)
Third and final attempt to reach pt, no answer, left vm. Sending message in pt's chart.

## 2021-02-21 ENCOUNTER — Other Ambulatory Visit: Payer: Self-pay

## 2021-02-21 ENCOUNTER — Emergency Department (HOSPITAL_COMMUNITY)
Admission: EM | Admit: 2021-02-21 | Discharge: 2021-02-22 | Disposition: A | Discharge status: Home / Self Care | Payer: Self-pay | Attending: Emergency Medicine | Admitting: Emergency Medicine

## 2021-02-21 ENCOUNTER — Encounter (HOSPITAL_COMMUNITY): Payer: Self-pay

## 2021-02-21 ENCOUNTER — Emergency Department (HOSPITAL_COMMUNITY): Payer: Self-pay

## 2021-02-21 DIAGNOSIS — R509 Fever, unspecified: Secondary | ICD-10-CM | POA: Insufficient documentation

## 2021-02-21 DIAGNOSIS — Z87891 Personal history of nicotine dependence: Secondary | ICD-10-CM | POA: Insufficient documentation

## 2021-02-21 DIAGNOSIS — R0981 Nasal congestion: Secondary | ICD-10-CM | POA: Insufficient documentation

## 2021-02-21 DIAGNOSIS — R059 Cough, unspecified: Secondary | ICD-10-CM | POA: Insufficient documentation

## 2021-02-21 DIAGNOSIS — Z91010 Allergy to peanuts: Secondary | ICD-10-CM | POA: Insufficient documentation

## 2021-02-21 DIAGNOSIS — Z20822 Contact with and (suspected) exposure to covid-19: Secondary | ICD-10-CM | POA: Insufficient documentation

## 2021-02-21 DIAGNOSIS — Z859 Personal history of malignant neoplasm, unspecified: Secondary | ICD-10-CM | POA: Insufficient documentation

## 2021-02-21 DIAGNOSIS — R0789 Other chest pain: Secondary | ICD-10-CM | POA: Insufficient documentation

## 2021-02-21 DIAGNOSIS — R197 Diarrhea, unspecified: Secondary | ICD-10-CM | POA: Insufficient documentation

## 2021-02-21 DIAGNOSIS — R0600 Dyspnea, unspecified: Secondary | ICD-10-CM | POA: Insufficient documentation

## 2021-02-21 DIAGNOSIS — R112 Nausea with vomiting, unspecified: Secondary | ICD-10-CM | POA: Insufficient documentation

## 2021-02-21 DIAGNOSIS — R109 Unspecified abdominal pain: Secondary | ICD-10-CM | POA: Insufficient documentation

## 2021-02-21 LAB — COMPREHENSIVE METABOLIC PANEL
ALT: 53 U/L — ABNORMAL HIGH (ref 0–44)
AST: 31 U/L (ref 15–41)
Albumin: 3.9 g/dL (ref 3.5–5.0)
Alkaline Phosphatase: 89 U/L (ref 38–126)
Anion gap: 9 (ref 5–15)
BUN: 6 mg/dL (ref 6–20)
CO2: 26 mmol/L (ref 22–32)
Calcium: 9.3 mg/dL (ref 8.9–10.3)
Chloride: 101 mmol/L (ref 98–111)
Creatinine, Ser: 0.59 mg/dL (ref 0.44–1.00)
GFR, Estimated: >60 mL/min (ref >60)
Glucose, Bld: 92 mg/dL (ref 70–99)
Potassium: 3.6 mmol/L (ref 3.5–5.1)
Sodium: 136 mmol/L (ref 135–145)
Total Bilirubin: 0.9 mg/dL (ref 0.3–1.2)
Total Protein: 7.1 g/dL (ref 6.5–8.1)

## 2021-02-21 LAB — CBC WITH DIFFERENTIAL/PLATELET
Abs Immature Granulocytes: 0.03 10*3/uL (ref 0.00–0.07)
Basophils Absolute: 0 10*3/uL (ref 0.0–0.1)
Basophils Relative: 1 %
Eosinophils Absolute: 0.2 10*3/uL (ref 0.0–0.5)
Eosinophils Relative: 4 %
HCT: 41.7 % (ref 36.0–46.0)
Hemoglobin: 14 g/dL (ref 12.0–15.0)
Immature Granulocytes: 1 %
Lymphocytes Relative: 21 %
Lymphs Abs: 0.8 10*3/uL (ref 0.7–4.0)
MCH: 28.7 pg (ref 26.0–34.0)
MCHC: 33.6 g/dL (ref 30.0–36.0)
MCV: 85.5 fL (ref 80.0–100.0)
Monocytes Absolute: 0.5 10*3/uL (ref 0.1–1.0)
Monocytes Relative: 12 %
Neutro Abs: 2.5 10*3/uL (ref 1.7–7.7)
Neutrophils Relative %: 61 %
Platelets: 253 10*3/uL (ref 150–400)
RBC: 4.88 MIL/uL (ref 3.87–5.11)
RDW: 12.6 % (ref 11.5–15.5)
WBC: 4 10*3/uL (ref 4.0–10.5)
nRBC: 0 % (ref 0.0–0.2)

## 2021-02-21 LAB — I-STAT BETA HCG BLOOD, ED (MC, WL, AP ONLY): I-stat hCG, quantitative: <5 m[IU]/mL (ref <5)

## 2021-02-21 LAB — SARS CORONAVIRUS 2 (TAT 6-24 HRS): SARS Coronavirus 2: NEGATIVE

## 2021-02-21 LAB — LIPASE, BLOOD: Lipase: 25 U/L (ref 11–51)

## 2021-02-21 MED ORDER — ONDANSETRON 4 MG PO TBDP: 8.0000 mg | ORAL_TABLET | Freq: Once | ORAL | Status: DC

## 2021-02-21 MED ORDER — ONDANSETRON HCL 4 MG/2ML IJ SOLN
4.0000 mg | Freq: Once | INTRAMUSCULAR | Status: AC
Start: 2021-02-22 — End: 2021-02-22
  Administered 2021-02-22: 4 mg via INTRAVENOUS
  Filled 2021-02-21: qty 2

## 2021-02-21 MED ORDER — FAMOTIDINE 20 MG IN NS 100 ML IVPB
20.0000 mg | Freq: Once | INTRAVENOUS | Status: AC
  Administered 2021-02-22: 20 mg via INTRAVENOUS
  Filled 2021-02-21: qty 100

## 2021-02-21 MED ORDER — SODIUM CHLORIDE 0.9 % IV BOLUS
1000.0000 mL | Freq: Once | INTRAVENOUS | Status: AC
Start: 2021-02-22 — End: 2021-02-22
  Administered 2021-02-22: 1000 mL via INTRAVENOUS

## 2021-02-21 NOTE — ED Provider Notes (Signed)
[facility] EMERGENCY DEPARTMENT Provider Note   CSN: [id number] Arrival date & time: 02/21/21  1104     History Chief Complaint  Patient presents with   Hemoptysis   Abdominal Pain    Cheryl Fitzgerald is a 25 y.o. female with a hx of depression, latent TB, and cholecystectomy who presents to the ED with complaints of N/V/D x 3 days. Patient reports 3-4 episodes of emesis per day and TNTC episodes of diarrhea per day with subjective fever, chills, congestion, dyspnea, and cough that is productive with blood streaked sputum. Having some anterior chest pain that is worse with coughing/deep breathing as well as epigastric pain. She feels dehydrated & lightheaded with position changes. Recent possible COVID exposure. Born in [place] with latent TB diagnosis, no recent foreign travel. Denies syncope, melena, hematochezia, hematemesis, leg pain/swelling, hemoptysis, recent surgery/trauma, recent long travel, personal hx of cancer, or hx of DVT/PE. Denies recent abx use.    HPI     Past Medical History:  Diagnosis Date   Adjustment disorder with mixed emotional features 03/23/2015   Depression    no medication   Shortness of breath dyspnea     Patient Active Problem List   Diagnosis Date Noted   Toe pain, bilateral 12/27/2020   Chronic diarrhea 10/31/2020   Generalized anxiety disorder 10/31/2020   History of cholecystectomy 10/31/2020   Axillary hidradenitis suppurativa 11/29/2016   Cellulitis of left arm    Acquired hyperbilirubinemia    Choledocholithiasis    Non-specific filling defect of common bile duct    Calculous cholecystitis with obstruction 08/08/2015   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   Post-dates pregnancy 04/05/2015   Adjustment disorder with mixed emotional features 03/23/2015   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   Inadequate housing 03/22/2015   Food hunger 03/22/2015    Past Surgical History:  Procedure  Laterality Date   CHOLECYSTECTOMY N/A 08/08/2015   Procedure: LAPAROSCOPIC CHOLECYSTECTOMY WITH INTRAOPERATIVE CHOLANGIOGRAM;  Surgeon: Leafy Ro, MD;  Location: [facility] ORS;  Service: General;  Laterality: N/A;   ENDOSCOPIC RETROGRADE CHOLANGIOPANCREATOGRAPHY (ERCP) WITH PROPOFOL N/A 08/09/2015   Procedure: ENDOSCOPIC RETROGRADE CHOLANGIOPANCREATOGRAPHY (ERCP) WITH PROPOFOL;  Surgeon: Midge Minium, MD;  Location: [facility] ENDOSCOPY;  Service: Endoscopy;  Laterality: N/A;   NO PAST SURGERIES     VAGINAL DELIVERY  04/07/2015     OB History     Gravida  2   Para  1   Term  1   Preterm      AB  0   Living  1      SAB  0   IAB      Ectopic      Multiple  0   Live Births  1           Family History  Problem Relation Age of Onset   Diabetes Mother    Hyperlipidemia Mother    Hypertension Mother    Arthritis Father    Depression Father    Breast cancer Maternal Grandmother    Diabetes Maternal Grandmother     Social History   Tobacco Use   Smoking status: Former    Packs/day: 0.10    Types: Cigarettes   Smokeless tobacco: Never  Substance Use Topics   Alcohol use: Not Currently   Drug use: Not Currently    Types: Marijuana    Comment: patient states that she is unsure if her marijuana has been "laced". recent use 3 days ago    Home  Medications Prior to Admission medications   Medication Sig Start Date End Date Taking? Authorizing Provider  Acetaminophen (MIDOL PO) Take 1-2 tablets by mouth 2 (two) times daily as needed (pain).   Yes [provider]  Multiple Vitamin (MULTIVITAMIN) tablet Take 1 tablet by mouth daily.   Yes [provider]  naproxen sodium (ALEVE) 220 MG tablet Take 440 mg by mouth 2 (two) times daily as needed (pain).   Yes [provider]  busPIRone (BUSPAR) 5 MG tablet Take 1 tablet (5 mg total) by mouth 2 (two) times daily. Patient not taking: No sig reported 12/18/20 03/18/21  Claiborne Rigg, NP  cholestyramine  light (PREVALITE) 4 g packet Take 1 packet (4 g total) by mouth daily for 7 days, THEN 1 packet (4 g total) 2 (two) times daily Patient not taking: Reported on 02/21/2021 10/31/20 11/30/20  Mayers, Cari S, PA-C  escitalopram (LEXAPRO) 5 MG tablet Take 1 tablet (5 mg total) by mouth daily. Patient not taking: No sig reported 12/18/20 03/29/21  Claiborne Rigg, NP  MELATONIN MAXIMUM STRENGTH 5 MG TABS Take 1 tablet (5 mg total) by mouth at bedtime. Patient not taking: No sig reported 12/18/20 03/18/21  Claiborne Rigg, NP  mupirocin ointment (BACTROBAN) 2 % Apply 1 application topically 2 (two) times daily. Patient not taking: No sig reported 12/18/20   Claiborne Rigg, NP  neomycin-polymyxin-hydrocortisone (CORTISPORIN) OTIC solution Apply 1-2 drops to toes after shower once daily Patient not taking: No sig reported 01/09/21   Asencion Islam, DPM    Allergies    Gramineae pollens and Peanut oil  Review of Systems   Review of Systems  Constitutional:  Positive for chills, fatigue and fever.  HENT:  Positive for congestion. Negative for ear pain.   Respiratory:  Positive for cough and shortness of breath.   Cardiovascular:  Positive for chest pain. Negative for leg swelling.  Gastrointestinal:  Positive for abdominal pain, diarrhea, nausea and vomiting. Negative for blood in stool.  Genitourinary:  Negative for dysuria.  Neurological:  Positive for light-headedness. Negative for syncope.  All other systems reviewed and are negative.  Physical Exam Updated Vital Signs BP 128/80 (BP Location: Right Arm)   Pulse (!) 106   Temp 98.6 F (37 C) (Oral)   Resp 19   SpO2 100%   Physical Exam Vitals and nursing note reviewed.  Constitutional:      General: She is not in acute distress.    Appearance: She is well-developed. She is not toxic-appearing.  HENT:     Head: Normocephalic and atraumatic.     Mouth/Throat:     Comments: Dry mucous membranes.  Posterior oropharynx is symmetric  appearing. Patient tolerating own secretions without difficulty. No trismus. No drooling. No hot potato voice. No swelling beneath the tongue, submandibular compartment is soft.  Eyes:     General:        Right eye: No discharge.        Left eye: No discharge.     Conjunctiva/sclera: Conjunctivae normal.  Cardiovascular:     Rate and Rhythm: Normal rate and regular rhythm.  Pulmonary:     Effort: Pulmonary effort is normal. No respiratory distress.     Breath sounds: Normal breath sounds. No wheezing, rhonchi or rales.  Chest:     Chest wall: Tenderness (anterior chest wall) present.  Abdominal:     General: There is no distension.     Palpations: Abdomen is soft.  Tenderness: There is no abdominal tenderness. There is no guarding or rebound.  Musculoskeletal:     Cervical back: Neck supple.     Comments: No lower extremity edema or calf tenderness.   Skin:    General: Skin is warm and dry.     Findings: No rash.  Neurological:     Mental Status: She is alert.     Comments: Clear speech.   Psychiatric:        Behavior: Behavior normal.    ED Results / Procedures / Treatments   Labs (all labs ordered are listed, but only abnormal results are displayed) Labs Reviewed  COMPREHENSIVE METABOLIC PANEL - Abnormal; Notable for the following components:      Result Value   ALT 53 (*)    All other components within normal limits  SARS CORONAVIRUS 2 (TAT 6-24 HRS)  CBC WITH DIFFERENTIAL/PLATELET  LIPASE, BLOOD  I-STAT BETA HCG BLOOD, ED ([facility], [facility], [facility] ONLY)    EKG EKG Interpretation  Date/Time:  Friday February 21 2021 12:10:36 EDT Ventricular Rate:  90 PR Interval:  140 QRS Duration: 84 QT Interval:  346 QTC Calculation: 423 R Axis:   49 Text Interpretation: Normal sinus rhythm with sinus arrhythmia Normal ECG No acute changes Confirmed by Drema Pry [id number]) on 02/21/2021 11:04:33 PM  Radiology DG Chest 2 View  Result Date: 02/21/2021 CLINICAL DATA:  Shortness of  breath, productive cough EXAM: CHEST - 2 VIEW COMPARISON:  10/18/2019 FINDINGS: The heart size and mediastinal contours are within normal limits. Both lungs are clear. The visualized skeletal structures are unremarkable. IMPRESSION: No acute abnormality of the lungs. Electronically Signed   By: Lauralyn Primes M.D.   On: 02/21/2021 13:03    Procedures Procedures   Medications Ordered in ED Medications  ondansetron (ZOFRAN-ODT) disintegrating tablet 8 mg (has no administration in time range)    ED Course  I have reviewed the triage vital signs and the nursing notes.  Pertinent labs & imaging results that were available during my care of the patient were reviewed by me and considered in my medical decision making (see chart for details).    MDM Rules/Calculators/A&P                           Patient presents to the ED with complaints of abdominal pain. Patient nontoxic appearing, in no apparent distress, vitals on arrival without significant abnormality.   Additional history obtained:  Additional history obtained from chart review & nursing note review.   Lab Tests:  I reviewed and interpreted labs, which included:  CBC, CMP, lipase: Unremarkable.  Preg test: Negative.  COVID testing: Negative.   Imaging Studies ordered:  CXR ordered in triage, I independently reviewed, formal radiology impression shows: No acute abnormality of the lungs  ED Course:  Plan for symptomatic management, will check d-dimer and influenza testing.  D-dimer positive--> CTA ordered, difficulty with IV access delayed care some.   CTA negative for PE.  Overall reassuring work-up. CTA negative for PE. EKG without acute ischemia, chest discomfort reproducible with chest wall palpation. Abdomen nontender w/o peritoneal signs. Oropharynx clear. No meningismus. Feeling better following supportive care, tolerating PO, ambulatory, overall appears appropriate for discharge. Suspect viral illness at this time. I  discussed results, treatment plan, need for follow-up, and return precautions with the patient. Provided opportunity for questions, patient confirmed understanding and is in agreement with plan.   Blood pressure 110/70, pulse 98, temperature 98.6  F (37 C), resp. rate 18, height 5\' 6"  (1.676 m), weight 111.1 kg, SpO2 98 %.   Portions of this note were generated with . Dictation errors may occur despite best attempts at proofreading.  Final Clinical Impression(s) / ED Diagnoses Final diagnoses:  Nausea vomiting and diarrhea  Cough    Rx / DC Orders ED Discharge Orders          Ordered    benzonatate (TESSALON) 100 MG capsule  Every 8 hours        02/22/21 0637    ondansetron (ZOFRAN ODT) 4 MG disintegrating tablet  Every 8 hours PRN        02/22/21 0637    dicyclomine (BENTYL) 20 MG tablet  Every 8 hours PRN        02/22/21 0637    naproxen (NAPROSYN) 375 MG tablet  2 times daily PRN        02/22/21 0637             02/24/21, PA-C 02/23/21 0425    02/25/21, MD 02/23/21 [phone]

## 2021-02-21 NOTE — ED Provider Notes (Signed)
Emergency Medicine Provider Triage Evaluation Note  Cheryl Fitzgerald , a 25 y.o. female  was evaluated in triage.  Pt complains of Hemoptysis, cough, fever x1 day.  COVID vaccinated x2, recent known exposure.  States that she is coughing up 1 teaspoon of dark red blood daily.  Also having multiple episodes of emesis daily and unable to keep any food down.  Endorses some epigastric pain.  No history of asthma or COPD.Marland Kitchen  Review of Systems  Positive: Hemoptysis, cough, fever, emesis Negative: Chest pain  Physical Exam  BP 126/80 (BP Location: Left Arm)   Pulse 94   Temp 98.7 F (37.1 C) (Oral)   Resp 20   SpO2 100%  Gen:   Awake, no distress   Resp:  Normal effort  MSK:   Moves extremities without difficulty  Other:  Lungs clear to auscultation bilaterally, patient actively coughing in room.  Medical Decision Making  Medically screening exam initiated at 12:09 PM.  Appropriate orders placed.  [name] was informed that the remainder of the evaluation will be completed by another provider, this initial triage assessment does not replace that evaluation, and the importance of remaining in the ED until their evaluation is complete.  Likely COVID, patient is having hemoptysis so we will order chest x-ray.  We will check basic labs due to multiple episodes of emesis and possible electrolyte derangement.      Theron Arista, PA-C 02/21/21 1212    Wynetta Fines, MD 02/22/21 [phone]

## 2021-02-21 NOTE — ED Triage Notes (Signed)
Pt reports coughing up blood, generalized pain and subjective fever. Pt has had 2 covid vaccines

## 2021-02-22 ENCOUNTER — Emergency Department (HOSPITAL_COMMUNITY): Payer: Self-pay

## 2021-02-22 LAB — D-DIMER, QUANTITATIVE: D-Dimer, Quant: 0.94 ug/mL-FEU — ABNORMAL HIGH (ref 0.00–0.50)

## 2021-02-22 MED ORDER — SODIUM CHLORIDE 0.9 % IV SOLN
12.5000 mg | Freq: Four times a day (QID) | INTRAVENOUS | Status: DC | PRN
  Filled 2021-02-22: qty 0.5

## 2021-02-22 MED ORDER — BENZONATATE 100 MG PO CAPS
100.0000 mg | ORAL_CAPSULE | Freq: Once | ORAL | Status: AC
  Administered 2021-02-22: 100 mg via ORAL
  Filled 2021-02-22: qty 1

## 2021-02-22 MED ORDER — BENZONATATE 100 MG PO CAPS: 100.0000 mg | ORAL_CAPSULE | Freq: Three times a day (TID) | ORAL | 0 refills | Status: DC

## 2021-02-22 MED ORDER — ACETAMINOPHEN 325 MG PO TABS
650.0000 mg | ORAL_TABLET | Freq: Once | ORAL | Status: AC
  Administered 2021-02-22: 650 mg via ORAL
  Filled 2021-02-22: qty 2

## 2021-02-22 MED ORDER — ONDANSETRON 4 MG PO TBDP: 4.0000 mg | ORAL_TABLET | Freq: Three times a day (TID) | ORAL | 0 refills | Status: DC | PRN

## 2021-02-22 MED ORDER — IOHEXOL 350 MG/ML SOLN
75.0000 mL | Freq: Once | INTRAVENOUS | Status: AC | PRN
  Administered 2021-02-22: 75 mL via INTRAVENOUS

## 2021-02-22 MED ORDER — NAPROXEN 375 MG PO TABS: 375.0000 mg | ORAL_TABLET | Freq: Two times a day (BID) | ORAL | 0 refills | Status: DC | PRN

## 2021-02-22 MED ORDER — SODIUM CHLORIDE 0.9 % IV BOLUS
1000.0000 mL | Freq: Once | INTRAVENOUS | Status: AC
  Administered 2021-02-22: 1000 mL via INTRAVENOUS

## 2021-02-22 MED ORDER — LIDOCAINE VISCOUS HCL 2 % MT SOLN
15.0000 mL | Freq: Once | OROMUCOSAL | Status: AC
  Administered 2021-02-22: 15 mL via OROMUCOSAL
  Filled 2021-02-22: qty 15

## 2021-02-22 MED ORDER — DICYCLOMINE HCL 20 MG PO TABS: 20.0000 mg | ORAL_TABLET | Freq: Three times a day (TID) | ORAL | 0 refills | Status: AC | PRN

## 2021-02-22 NOTE — Discharge Instructions (Addendum)
You were seen in the ER today for vomiting, diarrhea, cough, trouble breathing and discomfort. Your labs were overall reassuring, your ALT (liver function test) was mildly elevated please have this rechecked by primary care provider. Your CT scan did not show a blood clot. Your covid test was negative. We are sending you home with the following medicines:  - Zofran - take every 8 hours as needed for nausea/vomiting.  - Naproxen- this is a nonsteroidal anti-inflammatory medication that will help with pain and swelling. Be sure to take this medication as prescribed with food, 1 pill every 12 hours,  It should be taken with food, as it can cause stomach upset, and more seriously, stomach bleeding. Do not take other nonsteroidal anti-inflammatory medications with this such as Advil, Motrin, Aleve, Mobic, Goodie Powder, or Motrin.   - Tessalon- take every 8 hours as needed for coughing.  - Bentyl- take every 8 hours as needed for abdominal cramping.   You make take Tylenol per over the counter dosing with these medications.   We have prescribed you new medication(s) today. Discuss the medications prescribed today with your pharmacist as they can have adverse effects and interactions with your other medicines including over the counter and prescribed medications. Seek medical evaluation if you start to experience new or abnormal symptoms after taking one of these medicines, seek care immediately if you start to experience difficulty breathing, feeling of your throat closing, facial swelling, or rash as these could be indications of a more serious allergic reaction  Please follow up with primary care within 3 days. Return to the ER for new or worsening symptoms including but not limited to new or worsening pain, trouble breathing, passing out, blood in vomit/stool, inability to keep fluids down or any other concerns.   Results for orders placed or performed during the hospital encounter of 02/21/21  SARS  CORONAVIRUS 2 (TAT 6-24 HRS) Nasopharyngeal Nasopharyngeal Swab   Specimen: Nasopharyngeal Swab  Result Value Ref Range   SARS Coronavirus 2 NEGATIVE NEGATIVE  Comprehensive metabolic panel  Result Value Ref Range   Sodium 136 135 - 145 mmol/L   Potassium 3.6 3.5 - 5.1 mmol/L   Chloride 101 98 - 111 mmol/L   CO2 26 22 - 32 mmol/L   Glucose, Bld 92 70 - 99 mg/dL   BUN 6 6 - 20 mg/dL   Creatinine, Ser 5.78 0.44 - 1.00 mg/dL   Calcium 9.3 8.9 - 46.9 mg/dL   Total Protein 7.1 6.5 - 8.1 g/dL   Albumin 3.9 3.5 - 5.0 g/dL   AST 31 15 - 41 U/L   ALT 53 (H) 0 - 44 U/L   Alkaline Phosphatase 89 38 - 126 U/L   Total Bilirubin 0.9 0.3 - 1.2 mg/dL   GFR, Estimated >62 >95 mL/min   Anion gap 9 5 - 15  CBC with Differential  Result Value Ref Range   WBC 4.0 4.0 - 10.5 K/uL   RBC 4.88 3.87 - 5.11 MIL/uL   Hemoglobin 14.0 12.0 - 15.0 g/dL   HCT 28.4 13.2 - 44.0 %   MCV 85.5 80.0 - 100.0 fL   MCH 28.7 26.0 - 34.0 pg   MCHC 33.6 30.0 - 36.0 g/dL   RDW 10.2 72.5 - 36.6 %   Platelets 253 150 - 400 K/uL   nRBC 0.0 0.0 - 0.2 %   Neutrophils Relative % 61 %   Neutro Abs 2.5 1.7 - 7.7 K/uL   Lymphocytes Relative 21 %  Lymphs Abs 0.8 0.7 - 4.0 K/uL   Monocytes Relative 12 %   Monocytes Absolute 0.5 0.1 - 1.0 K/uL   Eosinophils Relative 4 %   Eosinophils Absolute 0.2 0.0 - 0.5 K/uL   Basophils Relative 1 %   Basophils Absolute 0.0 0.0 - 0.1 K/uL   Immature Granulocytes 1 %   Abs Immature Granulocytes 0.03 0.00 - 0.07 K/uL  Lipase, blood  Result Value Ref Range   Lipase 25 11 - 51 U/L  D-dimer, quantitative  Result Value Ref Range   D-Dimer, Quant 0.94 (H) 0.00 - 0.50 ug/mL-FEU  I-Stat Beta hCG blood, ED ([facility], [facility], [facility] only)  Result Value Ref Range   I-stat hCG, quantitative <5.0 <5 mIU/mL   Comment 3           DG Chest 2 View  Result Date: 02/21/2021 CLINICAL DATA:  Shortness of breath, productive cough EXAM: CHEST - 2 VIEW COMPARISON:  10/18/2019 FINDINGS: The heart size and  mediastinal contours are within normal limits. Both lungs are clear. The visualized skeletal structures are unremarkable. IMPRESSION: No acute abnormality of the lungs. Electronically Signed   By: Lauralyn Primes M.D.   On: 02/21/2021 13:03   CT Angio Chest PE W/Cm &/Or Wo Cm  Result Date: 02/22/2021 CLINICAL DATA:  Pulmonary embolism suspected.  Chest pain. EXAM: CT ANGIOGRAPHY CHEST WITH CONTRAST TECHNIQUE: Multidetector CT imaging of the chest was performed using the standard protocol during bolus administration of intravenous contrast. Multiplanar CT image reconstructions and MIPs were obtained to evaluate the vascular anatomy. CONTRAST:  80mL OMNIPAQUE IOHEXOL 350 MG/ML SOLN COMPARISON:  None. FINDINGS: Cardiovascular: Satisfactory opacification of the pulmonary arteries to the segmental level. No evidence of pulmonary embolism. Normal heart size. No pericardial effusion. Mediastinum/Nodes: Negative for adenopathy or mass. Lungs/Pleura: There is no edema, consolidation, effusion, or pneumothorax. Upper Abdomen: Cholecystectomy. Musculoskeletal: Negative Review of the MIP images confirms the above findings. IMPRESSION: Negative chest CTA. Electronically Signed   By: Tiburcio Pea M.D.   On: 02/22/2021 05:38

## 2021-02-22 NOTE — ED Notes (Signed)
Ambulated to restroom with assistance at this time ?

## 2021-02-22 NOTE — ED Notes (Signed)
Received verbal report from Keith W RN at this time 

## 2021-02-22 NOTE — ED Notes (Signed)
Attempted to d/c pt. Pt provided d/c paperwork with prescription. Pt is upset stating she was told by pharmacy that we would bring her the medication that she was d/c with to her in the bed she was in. Pt was advised that we do not do that. Pt then requested cab voucher states that she was told by [facility] transportation that we would provide her with transportation back. Advised pt we could provide her with a bus pass but not a cab voucher. Pt states that she was informed by the doctor that we would provide her with more fluids and pain medication while she was here. Advised pt that nothing was ordered and that she was up for d/c. Pt states that she can't afford her prescriptions how is she suppose to get her medication advised pt that she could use goodrx or take to [facility]. Pt states that she can't afford it. Charge nurse aware of same. Pt ambulated to restroom and slammed the door. Pt remains in restroom at this time. Security paged to escort pt out

## 2021-02-22 NOTE — ED Notes (Signed)
Pt is asking when she can have something for nausea, cough, and sore throat. Provider sent a message reference same

## 2021-02-22 NOTE — ED Notes (Signed)
Transport here to take pt to ct scan

## 2021-02-22 NOTE — ED Notes (Signed)
Pt returned from ct-scan. Is now requesting something to drink and states that the meds we gave her for her throat is no longer working. Message sent to provider at this time

## 2021-02-27 ENCOUNTER — Ambulatory Visit: Payer: No Typology Code available for payment source | Admitting: Sports Medicine

## 2021-02-28 ENCOUNTER — Telehealth: Payer: Self-pay | Admitting: Nurse Practitioner

## 2021-02-28 NOTE — Telephone Encounter (Signed)
Copied from CRM [id number]. Topic: Appointment Scheduling - Scheduling Inquiry for Clinic >> Feb 27, 2021  2:49 PM Randol Kern wrote: Reason for CRM: Pt needs to schedule appt with [facility].  Best contact: [phone]

## 2021-03-04 ENCOUNTER — Other Ambulatory Visit: Payer: Self-pay

## 2021-03-06 ENCOUNTER — Telehealth: Payer: Self-pay | Admitting: Nurse Practitioner

## 2021-03-06 NOTE — Telephone Encounter (Signed)
Copied from CRM [id number]. Topic: Appointment Scheduling - Scheduling Inquiry for Clinic >> Mar 06, 2021 10:38 AM Randol Kern wrote: Reason for CRM: Pt wants to reschedule upcoming appt for counseling with [facility] McCoy  Best contact: [phone]

## 2021-03-10 ENCOUNTER — Ambulatory Visit: Payer: Self-pay | Admitting: Clinical

## 2021-03-17 NOTE — Telephone Encounter (Signed)
I spoke with pt and she mentioned that she is already involved in therapy with [facility] as it has been mandated by the court. Informed pt to call back if further support is needed.

## 2021-03-18 ENCOUNTER — Other Ambulatory Visit: Payer: Self-pay

## 2021-03-19 ENCOUNTER — Inpatient Hospital Stay: Payer: Self-pay | Admitting: Physician Assistant

## 2021-03-27 ENCOUNTER — Encounter: Payer: Self-pay | Admitting: Gastroenterology

## 2021-03-31 ENCOUNTER — Inpatient Hospital Stay: Payer: Self-pay | Admitting: Clinical

## 2021-04-02 ENCOUNTER — Other Ambulatory Visit: Payer: Self-pay

## 2021-04-03 ENCOUNTER — Inpatient Hospital Stay: Payer: Self-pay | Admitting: Physician Assistant

## 2021-04-17 ENCOUNTER — Ambulatory Visit: Payer: No Typology Code available for payment source | Admitting: Sports Medicine

## 2021-04-23 ENCOUNTER — Ambulatory Visit: Payer: Self-pay | Admitting: *Deleted

## 2021-04-23 NOTE — Telephone Encounter (Signed)
Patient requesting a referral.

## 2021-04-23 NOTE — Telephone Encounter (Signed)
Pt calling requesting referral to dental clinic. Transferred to NT as pt mentioned severe pain. Pt reports dental pain, gums bleeding when brushing. States "I will need several extractions." Does report swelling at both jaws, under chin as well as "Sores inside cheeks." Denies fever.States last MAy needed amoxicillin, "GAve me diarrhea."  Advised ED for worsening symptoms, fever. Care advise given. Pt verbalizes understanding. Assured pt NT would route to practice for PCPs review and final disposition. Please advise: [phone]     Reason for Disposition  [1] Face is swollen AND [2] no fever  Answer Assessment - Initial Assessment Questions 1. LOCATION: "Which tooth is hurting?"  (e.g., right-side/left-side, upper/lower, front/back)     All 2. ONSET: "When did the toothache start?"  (e.g., hours, days)      5 months 3. SEVERITY: "How bad is the toothache?"  (Scale 1-10; mild, moderate or severe)   - MILD (1-3): doesn't interfere with chewing    - MODERATE (4-7): interferes with chewing, interferes with normal activities, awakens from sleep     - SEVERE (8-10): unable to eat, unable to do any normal activities, excruciating pain        8.5-9/10 4. SWELLING: "Is there any visible swelling of your face?"     Yes. Jaw bones, under chin. Inside gums swelling 5. OTHER SYMPTOMS: "Do you have any other symptoms?" (e.g., fever)     no  Protocols used: Toothache-A-AH

## 2021-04-24 ENCOUNTER — Other Ambulatory Visit: Payer: Self-pay | Admitting: Nurse Practitioner

## 2021-04-24 DIAGNOSIS — K089 Disorder of teeth and supporting structures, unspecified: Secondary | ICD-10-CM

## 2021-04-24 NOTE — Telephone Encounter (Signed)
For orange card dental referrals. You can refer to dentist. Diagnosis: Poor dentition. I have placed her referral but this is just FYI for future orange card referrals. Thanks!!!

## 2021-04-24 NOTE — Telephone Encounter (Signed)
Could you put in a referral for her? She has the orange card.

## 2021-04-25 ENCOUNTER — Ambulatory Visit: Payer: Self-pay | Admitting: Gastroenterology

## 2021-04-25 NOTE — Telephone Encounter (Signed)
Left message to return call to our office.  

## 2021-05-08 ENCOUNTER — Ambulatory Visit: Payer: No Typology Code available for payment source | Admitting: Sports Medicine

## 2021-05-22 ENCOUNTER — Encounter: Payer: Self-pay | Admitting: Sports Medicine

## 2021-05-22 ENCOUNTER — Ambulatory Visit (INDEPENDENT_AMBULATORY_CARE_PROVIDER_SITE_OTHER): Payer: Self-pay | Admitting: Sports Medicine

## 2021-05-22 ENCOUNTER — Other Ambulatory Visit: Payer: Self-pay

## 2021-05-22 DIAGNOSIS — M79674 Pain in right toe(s): Secondary | ICD-10-CM

## 2021-05-22 DIAGNOSIS — Z9889 Other specified postprocedural states: Secondary | ICD-10-CM

## 2021-05-22 DIAGNOSIS — M79675 Pain in left toe(s): Secondary | ICD-10-CM

## 2021-05-22 NOTE — Progress Notes (Signed)
Subjective: Cheryl Fitzgerald is a 25 y.o. female patient returns to office today for follow up evaluation after having Right and left Hallux lateral nail avulsion performed on 12/26/2020 by Dr. Charlsie Merles. Patient reports that the ingrown's have grown out completely and there is some swelling and pain at the lateral borders of both her big toes patient states that the drops initially helped that I prescribed last visit but now the pain has returned.  Patient denies any other pedal complaints at this time.  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     benzonatate (TESSALON) 100 MG capsule Take 1 capsule (100 mg total) by mouth every 8 (eight) hours. 21 capsule 0   dicyclomine (BENTYL) 20 MG tablet Take 1 tablet (20 mg total) by mouth every 8 (eight) hours as needed for spasms. 15 tablet 0   Multiple Vitamin (MULTIVITAMIN) tablet Take 1 tablet by mouth daily.     naproxen (NAPROSYN) 375 MG tablet Take 1 tablet (375 mg total) by mouth 2 (two) times daily as needed for moderate pain. 10 tablet 0   ondansetron (ZOFRAN ODT) 4 MG disintegrating tablet Take 1 tablet (4 mg total) by mouth every 8  (eight) hours as needed for nausea or vomiting. 10 tablet 0   No current facility-administered medications on file prior to visit.    Allergies  Allergen Reactions   Gramineae Pollens Itching   Peanut Oil Other (See Comments)    unk    Objective:  General: Well developed, nourished, in no acute distress, alert and oriented x3 patient is expressing to me this visit again how she was treated at a past visit  Dermatology: Skin is warm, dry and supple bilateral.  Right and left hallux lateral nail fold appear to have mild ingrowing with erythema and edema at the lateral nail fold.  There is no active drainage no significant warmth mild tenderness to palpation to these nail folds.  The remaining nails appear unremarkable at this time. There are no other lesions or other signs of infection present.  Neurovascular status: Intact. No lower extremity swelling; No pain with calf compression bilateral.  Musculoskeletal: There is tenderness to the right greater than left lateral hallux nail fold. Muscular strength within normal limits bilateral.   Assesement and Plan: Problem List Items Addressed This Visit       Other   Toe pain, bilateral   Other Visit Diagnoses     Status post nail surgery    -  Primary       -Examined patient  -Rediscussed ingrowing nails and recurrent pain -Patient desires to have a repeat procedure -At this time since patient has to work tomorrow and has visitation with her child we  will wait to do the procedure when she has more time to rest her feet post procedure on next week -Patient to resume soaking with Epsom salt meanwhile as directed -Continue with good supportive shoes that do not rub or irritate the toes like before until time for procedure -Educated patient on long term care after nail surgery.  And advised her that after next week's procedure we will give her tramadol for pain and we will provide her with a work note to remain out of work for 3 days to  return on Monday 06-02-21 -Patient to return in 1 week for bilateral hallux nail avulsion procedures.  Asencion Islam, DPM

## 2021-05-23 ENCOUNTER — Ambulatory Visit: Payer: Self-pay | Admitting: Gastroenterology

## 2021-05-29 ENCOUNTER — Ambulatory Visit: Payer: Self-pay | Admitting: Sports Medicine

## 2021-06-05 ENCOUNTER — Ambulatory Visit: Payer: Self-pay | Admitting: *Deleted

## 2021-06-05 NOTE — Telephone Encounter (Signed)
°  Chief Complaint: chest pain when touching chest and breathing in and out  Symptoms: "feels like mini heart attack" heart pounds at times a few seconds, chest tender to touch in middle of chest , "hurts to exhale" . Dizziness yesterday but not today  Frequency: x couple of months now worse Pertinent Negatives: Patient denies difficulty breathing, N/V sweating,  Disposition: [x] ED /[] Urgent Care (no appt availability in office) / [] Appointment(In office/virtual)/ []  [facility]/ [] Home Care/ [] Refused Recommended Disposition  Additional Notes:  Requesting transportation to be set up to go to ED. Instructed patient if she can not find a ride to ED call 911. Patient requesting again for clinic to set up transportation if possible . Please advise            Reason for Disposition  Taking a deep breath makes pain worse  Answer Assessment - Initial Assessment Questions 1. LOCATION: "Where does it hurt?"       Chest pain in middle  2. RADIATION: "Does the pain go anywhere else?" (e.g., into neck, jaw, arms, back)     No  3. ONSET: "When did the chest pain begin?" (Minutes, hours or days)      Couple of months ago  4. PATTERN "Does the pain come and go, or has it been constant since it started?"  "Does it get worse with exertion?"     Comes and goes. Worse with exertion  5. DURATION: "How long does it last" (e.g., seconds, minutes, hours)     Few seconds and some times a few minutes  6. SEVERITY: "How bad is the pain?"  (e.g., Scale 1-10; mild, moderate, or severe)    - MILD (1-3): doesn't interfere with normal activities     - MODERATE (4-7): interferes with normal activities or awakens from sleep    - SEVERE (8-10): excruciating pain, unable to do any normal activities       Moderate  7. CARDIAC RISK FACTORS: "Do you have any history of heart problems or risk factors for heart disease?" (e.g., angina, prior heart attack; diabetes, high blood pressure, high cholesterol,  smoker, or strong family history of heart disease)     Smoke , HTN  8. PULMONARY RISK FACTORS: "Do you have any history of lung disease?"  (e.g., blood clots in lung, asthma, emphysema, birth control pills)     na 9. CAUSE: "What do you think is causing the chest pain?"     Not sure  10. OTHER SYMPTOMS: "Do you have any other symptoms?" (e.g., dizziness, nausea, vomiting, sweating, fever, difficulty breathing, cough)       No at work felt dizziness and lightheadedness heart pounding and beat fast at times 11. PREGNANCY: "Is there any chance you are pregnant?" "When was your last menstrual period?"       Na LMP last month  Protocols used: Chest Pain-A-AH

## 2021-06-05 NOTE — Telephone Encounter (Addendum)
Pt in NAD. Denies SOB and pain currently.  C/o pain in chest with lifting. Pain ongoing since September but has gotten worse.  States she +smoker of cigarettes and marijuana. Denies other cocaine use. Has not taken anxiety medication for several weeks.   Advised if pain in chest progressed or radiated to arm, face, back to call 911 and go to ED.   Transportation arranged for appt at [facility] tomorrow. Validated address in chart.  Patient verbalized understanding.

## 2021-06-06 ENCOUNTER — Other Ambulatory Visit: Payer: Self-pay

## 2021-06-06 ENCOUNTER — Encounter (HOSPITAL_COMMUNITY): Payer: Self-pay

## 2021-06-06 ENCOUNTER — Emergency Department (HOSPITAL_COMMUNITY)
Admission: EM | Admit: 2021-06-06 | Discharge: 2021-06-07 | Disposition: A | Discharge status: Home / Self Care | Payer: Self-pay | Attending: Emergency Medicine | Admitting: Emergency Medicine

## 2021-06-06 ENCOUNTER — Ambulatory Visit: Payer: Self-pay

## 2021-06-06 ENCOUNTER — Ambulatory Visit: Payer: Self-pay | Admitting: Family

## 2021-06-06 ENCOUNTER — Emergency Department (HOSPITAL_COMMUNITY): Payer: Self-pay

## 2021-06-06 DIAGNOSIS — R0602 Shortness of breath: Secondary | ICD-10-CM | POA: Insufficient documentation

## 2021-06-06 DIAGNOSIS — R079 Chest pain, unspecified: Secondary | ICD-10-CM | POA: Insufficient documentation

## 2021-06-06 DIAGNOSIS — Z5321 Procedure and treatment not carried out due to patient leaving prior to being seen by health care provider: Secondary | ICD-10-CM | POA: Insufficient documentation

## 2021-06-06 HISTORY — DX: Anxiety disorder, unspecified: F41.9
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
## 2021-06-06 NOTE — ED Notes (Signed)
Patient refused blood work nurse came in and explained to her how important it was for [place] to do blood work. Patient still refused.

## 2021-06-06 NOTE — ED Notes (Signed)
Explained to patient why blood work was needed. Patient states she was just interested in having a CXR and getting pain meds for her chest pain.

## 2021-06-06 NOTE — ED Triage Notes (Addendum)
Patient c/o mid chest pain x months and states the chest pain worse in the past month. Patient states chest pain worse with touch and extends to her breasts.Patient states the SOB happens when she has chest pain. Patient tearful in triage.  Patient denies fever and cough

## 2021-06-06 NOTE — Telephone Encounter (Signed)
°  Chief Complaint: chest pain Symptoms: chest pain 8.5/10, radiates to breast Frequency: several months keeps getting worse Pertinent Negatives: NA Disposition: [x] ED /[] Urgent Care (no appt availability in office) / [] Appointment(In office/virtual)/ []  [facility]/ [] Home Care/ [x] Refused Recommended Disposition /[] [facility]/ []  Follow-up with PCP Additional Notes: pt states she is having chest pain that radiates to breast and c/o 8.5/10 pain. She states the pain keeps getting worse and she has periods where she feels like she's having a heart attack and heart races. Advised pt ED and she stays she doesn't have transportation. Advised her 911 will transport her but refuses because of being billed. Attempted to reach [facility] transportation but got answering machine. Reached out to [facility] but no answer or message back. Advised pt regardless of cost she needed to be seen. Pt states she will figure something herself.    Reason for Disposition  SEVERE chest pain  Answer Assessment - Initial Assessment Questions 1. LOCATION: "Where does it hurt?"       middle 2. RADIATION: "Does the pain go anywhere else?" (e.g., into neck, jaw, arms, back)     Chest and breast 3. ONSET: "When did the chest pain begin?" (Minutes, hours or days)      Several months 4. PATTERN "Does the pain come and go, or has it been constant since it started?"  "Does it get worse with exertion?"  6. SEVERITY: "How bad is the pain?"  (e.g., Scale 1-10; mild, moderate, or severe)    - MILD (1-3): doesn't interfere with normal activities     - MODERATE (4-7): interferes with normal activities or awakens from sleep    - SEVERE (8-10): excruciating pain, unable to do any normal activities       8.5  10. OTHER SYMPTOMS: "Do you have any other symptoms?" (e.g., dizziness, nausea, vomiting, sweating, fever, difficulty breathing, cough)       Mini racing HR attacks  Protocols used: Chest Pain-A-AH

## 2021-06-07 ENCOUNTER — Encounter: Payer: Self-pay | Admitting: Nurse Practitioner

## 2021-06-11 ENCOUNTER — Inpatient Hospital Stay: Payer: Self-pay | Admitting: Nurse Practitioner

## 2021-06-11 ENCOUNTER — Encounter: Payer: Self-pay | Admitting: Nurse Practitioner

## 2021-06-12 ENCOUNTER — Ambulatory Visit: Payer: Self-pay | Admitting: Gastroenterology

## 2021-06-19 ENCOUNTER — Ambulatory Visit: Payer: Self-pay | Admitting: Sports Medicine

## 2021-07-24 ENCOUNTER — Other Ambulatory Visit: Payer: Self-pay

## 2022-02-17 ENCOUNTER — Emergency Department (HOSPITAL_COMMUNITY): Payer: Self-pay

## 2022-02-17 ENCOUNTER — Emergency Department (HOSPITAL_COMMUNITY)
Admission: EM | Admit: 2022-02-17 | Discharge: 2022-02-18 | Disposition: A | Discharge status: Home / Self Care | Payer: Self-pay | Attending: Emergency Medicine | Admitting: Emergency Medicine

## 2022-02-17 ENCOUNTER — Other Ambulatory Visit: Payer: Self-pay

## 2022-02-17 DIAGNOSIS — Z20822 Contact with and (suspected) exposure to covid-19: Secondary | ICD-10-CM | POA: Insufficient documentation

## 2022-02-17 DIAGNOSIS — F410 Panic disorder [episodic paroxysmal anxiety] without agoraphobia: Secondary | ICD-10-CM | POA: Insufficient documentation

## 2022-02-17 DIAGNOSIS — Z79899 Other long term (current) drug therapy: Secondary | ICD-10-CM | POA: Insufficient documentation

## 2022-02-17 DIAGNOSIS — N9489 Other specified conditions associated with female genital organs and menstrual cycle: Secondary | ICD-10-CM | POA: Insufficient documentation

## 2022-02-17 LAB — CBC WITH DIFFERENTIAL/PLATELET
Abs Immature Granulocytes: 0.04 10*3/uL (ref 0.00–0.07)
Basophils Absolute: 0 10*3/uL (ref 0.0–0.1)
Basophils Relative: 0 %
Eosinophils Absolute: 0.1 10*3/uL (ref 0.0–0.5)
Eosinophils Relative: 1 %
HCT: 40.4 % (ref 36.0–46.0)
Hemoglobin: 13.4 g/dL (ref 12.0–15.0)
Immature Granulocytes: 1 %
Lymphocytes Relative: 24 %
Lymphs Abs: 2 10*3/uL (ref 0.7–4.0)
MCH: 28.5 pg (ref 26.0–34.0)
MCHC: 33.2 g/dL (ref 30.0–36.0)
MCV: 86 fL (ref 80.0–100.0)
Monocytes Absolute: 0.6 10*3/uL (ref 0.1–1.0)
Monocytes Relative: 7 %
Neutro Abs: 5.8 10*3/uL (ref 1.7–7.7)
Neutrophils Relative %: 67 %
Platelets: 250 10*3/uL (ref 150–400)
RBC: 4.7 MIL/uL (ref 3.87–5.11)
RDW: 12.7 % (ref 11.5–15.5)
WBC: 8.6 10*3/uL (ref 4.0–10.5)
nRBC: 0 % (ref 0.0–0.2)

## 2022-02-17 LAB — COMPREHENSIVE METABOLIC PANEL
ALT: 40 U/L (ref 0–44)
AST: 28 U/L (ref 15–41)
Albumin: 4 g/dL (ref 3.5–5.0)
Alkaline Phosphatase: 63 U/L (ref 38–126)
Anion gap: 9 (ref 5–15)
BUN: 8 mg/dL (ref 6–20)
CO2: 22 mmol/L (ref 22–32)
Calcium: 9.1 mg/dL (ref 8.9–10.3)
Chloride: 109 mmol/L (ref 98–111)
Creatinine, Ser: 0.77 mg/dL (ref 0.44–1.00)
GFR, Estimated: >60 mL/min (ref >60)
Glucose, Bld: 101 mg/dL — ABNORMAL HIGH (ref 70–99)
Potassium: 3.7 mmol/L (ref 3.5–5.1)
Sodium: 140 mmol/L (ref 135–145)
Total Bilirubin: 0.4 mg/dL (ref 0.3–1.2)
Total Protein: 6.9 g/dL (ref 6.5–8.1)

## 2022-02-17 LAB — RAPID URINE DRUG SCREEN, HOSP PERFORMED
Amphetamines: NOT DETECTED
Barbiturates: NOT DETECTED
Benzodiazepines: NOT DETECTED
Cocaine: NOT DETECTED
Opiates: NOT DETECTED
Tetrahydrocannabinol: NOT DETECTED

## 2022-02-17 LAB — I-STAT BETA HCG BLOOD, ED (MC, WL, AP ONLY): I-stat hCG, quantitative: <5 m[IU]/mL (ref <5)

## 2022-02-17 LAB — TSH: TSH: 1.121 u[IU]/mL (ref 0.350–4.500)

## 2022-02-17 LAB — ETHANOL: Alcohol, Ethyl (B): <10 mg/dL (ref <10)

## 2022-02-17 MED ORDER — HYDROXYZINE HCL 25 MG PO TABS: 25.0000 mg | ORAL_TABLET | Freq: Four times a day (QID) | ORAL | 0 refills | Status: AC

## 2022-02-17 MED ORDER — HYDROXYZINE HCL 25 MG PO TABS
50.0000 mg | ORAL_TABLET | Freq: Once | ORAL | Status: AC
  Administered 2022-02-18: 50 mg via ORAL
  Filled 2022-02-17: qty 2

## 2022-02-17 MED ORDER — SODIUM CHLORIDE 0.9 % IV BOLUS
250.0000 mL | Freq: Once | INTRAVENOUS | Status: AC
  Administered 2022-02-17: 250 mL via INTRAVENOUS

## 2022-02-17 NOTE — Discharge Instructions (Addendum)
All of your lab work and your chest x-ray are normal.  You are not showing signs of an emergent condition requiring intervention today.  Please use the resources given to you by our social work team.  I have also printed out a prescription for a medication that you may use when you are feeling anxious called hydroxyzine.  You may start with half tablet but you may take up to 2 tablets every 6 hours if it helps you.  Your blood pressure was initially mildly elevated however your last 3 readings have been normal.  Return with any worsening symptoms however, otherwise you should follow-up outpatient.  The [place] [facility] or Dr. Lenord Fellers are good resources for general care.  It was a pleasure to meet you and we hope you feel better!

## 2022-02-17 NOTE — ED Notes (Signed)
Patient transported to X-ray 
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
## 2022-02-18 LAB — RESP PANEL BY RT-PCR (FLU A&B, COVID) ARPGX2
Influenza A by PCR: NEGATIVE
Influenza B by PCR: NEGATIVE
SARS Coronavirus 2 by RT PCR: NEGATIVE

## 2022-02-18 NOTE — ED Notes (Signed)
Patient verbalizes understanding of discharge instructions. Opportunity for questioning and answers were provided. Armband removed by staff, pt discharged from ED. Pt ambulatory to ED waiting room with steady gait. Taken to the waiting room by this RN per pt request.

## 2022-03-14 ENCOUNTER — Ambulatory Visit (HOSPITAL_COMMUNITY)
Admission: EM | Admit: 2022-03-14 | Discharge: 2022-03-14 | Disposition: A | Discharge status: Home / Self Care | Payer: Self-pay | Attending: Nurse Practitioner | Admitting: Nurse Practitioner

## 2022-03-14 ENCOUNTER — Other Ambulatory Visit: Payer: Self-pay

## 2022-03-14 ENCOUNTER — Encounter (HOSPITAL_COMMUNITY): Payer: Self-pay | Admitting: *Deleted

## 2022-03-14 DIAGNOSIS — W19XXXA Unspecified fall, initial encounter: Secondary | ICD-10-CM

## 2022-03-14 DIAGNOSIS — M79604 Pain in right leg: Secondary | ICD-10-CM

## 2022-03-14 DIAGNOSIS — Z3202 Encounter for pregnancy test, result negative: Secondary | ICD-10-CM

## 2022-03-14 DIAGNOSIS — M79605 Pain in left leg: Secondary | ICD-10-CM

## 2022-03-14 LAB — POC URINE PREG, ED: Preg Test, Ur: NEGATIVE

## 2022-03-14 MED ORDER — ACETAMINOPHEN 325 MG PO TABS
650.0000 mg | ORAL_TABLET | Freq: Once | ORAL | Status: AC
  Administered 2022-03-14: 650 mg via ORAL

## 2022-03-14 MED ORDER — TIZANIDINE HCL 4 MG PO TABS: 4.0000 mg | ORAL_TABLET | Freq: Every evening | ORAL | 0 refills | Status: DC | PRN

## 2022-03-14 MED ORDER — ACETAMINOPHEN 325 MG PO TABS
ORAL_TABLET | ORAL | Status: AC
  Filled 2022-03-14: qty 2

## 2022-03-14 NOTE — ED Provider Notes (Addendum)
[facility]    CSN: [id number] Arrival date & time: 03/14/22  1713      History   Chief Complaint Chief Complaint  Patient presents with   Fall    HPI Cheryl Fitzgerald is a 26 y.o. female.   Patient presents for bilateral lower extremity pain after falling yesterday on her knees going up the stairs at work.  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  Reports the pain goes from her hips all the way down to her feet.  The worst pain is between her knees and ankles and her shin muscles.  She denies any shooting pain, numbness or tingling.  Reports the pain is severe and has been affecting her ability to function today.  She is able to walk, however it is very painful.  No redness, bruising, swelling, decreased range of motion.  Has taken ibuprofen for the pain without much relief.  Reports "I need something stronger to help me sleep".  Is also concerned because she has a small bruise on her right toenail and thinks this came from the fall.  Patient reports periods are irregular; she recently stopped using Depo-Provera and is currently sexually active.    Past Medical History:  Diagnosis Date   Adjustment disorder with mixed emotional features 03/23/2015   Anxiety    Depression    no medication   Shortness of breath dyspnea     Patient Active Problem List   Diagnosis Date Noted   Toe pain, bilateral 12/27/2020   Chronic diarrhea 10/31/2020   Generalized anxiety disorder 10/31/2020   History of cholecystectomy 10/31/2020   Axillary hidradenitis suppurativa 11/29/2016   Cellulitis of left arm    Acquired hyperbilirubinemia    Choledocholithiasis    Non-specific filling defect of common bile duct    Calculous cholecystitis with obstruction 08/08/2015   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   Post-dates pregnancy 04/05/2015   Adjustment disorder with mixed emotional features  03/23/2015   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   Inadequate housing 03/22/2015   Food hunger 03/22/2015    Past Surgical History:  Procedure Laterality Date   CHOLECYSTECTOMY N/A 08/08/2015   Procedure: LAPAROSCOPIC CHOLECYSTECTOMY WITH INTRAOPERATIVE CHOLANGIOGRAM;  Surgeon: Leafy Ro, MD;  Location: [facility] ORS;  Service: General;  Laterality: N/A;   ENDOSCOPIC RETROGRADE CHOLANGIOPANCREATOGRAPHY (ERCP) WITH PROPOFOL N/A 08/09/2015   Procedure: ENDOSCOPIC RETROGRADE CHOLANGIOPANCREATOGRAPHY (ERCP) WITH PROPOFOL;  Surgeon: Midge Minium, MD;  Location: [facility] ENDOSCOPY;  Service: Endoscopy;  Laterality: N/A;   NO PAST SURGERIES     VAGINAL DELIVERY  04/07/2015    OB History     Gravida  2   Para  1   Term  1   Preterm      AB  0   Living  1      SAB  0   IAB      Ectopic      Multiple  0   Live Births  1            Home Medications    Prior to Admission medications   Medication Sig Start Date End Date Taking? Authorizing Provider  tiZANidine (ZANAFLEX) 4 MG tablet Take 1 tablet (4 mg total) by mouth at bedtime as needed for muscle spasms. Do not take with alcohol or while driving or operating heavy machinery.  May cause drowsiness. 03/14/22  Yes Valentino Nose, NP  Acetaminophen (MIDOL  PO) Take 1-2 tablets by mouth 2 (two) times daily as needed (pain).    [provider]  benzonatate (TESSALON) 100 MG capsule Take 1 capsule (100 mg total) by mouth every 8 (eight) hours. 02/22/21   Petrucelli, Samantha R, PA-C  dicyclomine (BENTYL) 20 MG tablet Take 1 tablet (20 mg total) by mouth every 8 (eight) hours as needed for spasms. 02/22/21   Petrucelli, Samantha R, PA-C  hydrOXYzine (ATARAX) 25 MG tablet Take 1 tablet (25 mg total) by mouth every 6 (six) hours. 02/17/22   Redwine, Madison A, PA-C  Multiple Vitamin (MULTIVITAMIN) tablet Take 1 tablet by mouth daily.    [provider]  naproxen (NAPROSYN) 375 MG tablet Take 1 tablet (375 mg total)  by mouth 2 (two) times daily as needed for moderate pain. 02/22/21   Petrucelli, Samantha R, PA-C  ondansetron (ZOFRAN ODT) 4 MG disintegrating tablet Take 1 tablet (4 mg total) by mouth every 8 (eight) hours as needed for nausea or vomiting. 02/22/21   Petrucelli, Glynda Jaeger, PA-C    Family History Family History  Problem Relation Age of Onset   Diabetes Mother    Hyperlipidemia Mother    Hypertension Mother    Arthritis Father    Depression Father    Breast cancer Maternal Grandmother    Diabetes Maternal Grandmother     Social History Social History   Tobacco Use   Smoking status: Some Days    Packs/day: 0.10    Types: Cigarettes   Smokeless tobacco: Never  Vaping Use   Vaping Use: Former  Substance Use Topics   Alcohol use: Not Currently   Drug use: Not Currently    Types: Marijuana     Allergies   Gramineae pollens and Peanut oil   Review of Systems Review of Systems Per HPI  Physical Exam Triage Vital Signs ED Triage Vitals  Enc Vitals Group     BP 03/14/22 1735 112/80     Pulse Rate 03/14/22 1735 93     Resp 03/14/22 1735 18     Temp 03/14/22 1735 98.3 F (36.8 C)     Temp src --      SpO2 03/14/22 1735 97 %     Weight --      Height --      Head Circumference --      Peak Flow --      Pain Score 03/14/22 1730 9     Pain Loc --      Pain Edu? --      Excl. in [place]? --    No data found.  Updated Vital Signs BP 112/80   Pulse 93   Temp 98.3 F (36.8 C)   Resp 18   LMP  (LMP Unknown)   SpO2 97%   Visual Acuity Right Eye Distance:   Left Eye Distance:   Bilateral Distance:    Right Eye Near:   Left Eye Near:    Bilateral Near:     Physical Exam Vitals and nursing note reviewed.  Constitutional:      General: She is not in acute distress.    Appearance: Normal appearance. She is not toxic-appearing.  HENT:     Mouth/Throat:     Mouth: Mucous membranes are moist.     Pharynx: Oropharynx is clear.  Pulmonary:     Effort: Pulmonary  effort is normal. No respiratory distress.  Musculoskeletal:     Right knee: No swelling, deformity, effusion, erythema, ecchymosis or  bony tenderness. Normal range of motion. Tenderness present. Normal alignment, normal meniscus and normal patellar mobility. Normal pulse.     Left knee: No swelling, deformity, effusion, erythema, ecchymosis or bony tenderness. Normal range of motion. Tenderness present. Normal alignment, normal meniscus and normal patellar mobility.     Right lower leg: Normal. No swelling, deformity, tenderness or bony tenderness. No edema.     Left lower leg: Normal. No swelling, deformity, tenderness or bony tenderness. No edema.     Right ankle: Normal.     Left ankle: Normal.     Right foot: Normal. Normal capillary refill. No swelling, deformity or bony tenderness. Normal pulse.     Left foot: Normal. Normal range of motion and normal capillary refill. No swelling, deformity or bony tenderness. Normal pulse.       Legs:       Feet:     Comments: Exquisite tenderness to palpation of the lower extremities bilaterally.  No obvious deformity, redness, or swelling.  No bruising.  She has a small abrasion to the left knee in area marked.  No surrounding erythema, warmth, or active drainage.  Skin:    General: Skin is warm and dry.     Capillary Refill: Capillary refill takes less than 2 seconds.     Coloration: Skin is not jaundiced or pale.     Findings: No erythema.  Neurological:     Mental Status: She is alert and oriented to person, place, and time.  Psychiatric:        Behavior: Behavior is cooperative.      UC Treatments / Results  Labs (all labs ordered are listed, but only abnormal results are displayed) Labs Reviewed  POC URINE PREG, ED    EKG   Radiology No results found.  Procedures Procedures (including critical care time)  Medications Ordered in UC Medications  acetaminophen (TYLENOL) tablet 650 mg (650 mg Oral Given 03/14/22 1757)     Initial Impression / Assessment and Plan / UC Course  I have reviewed the triage vital signs and the nursing notes.  Pertinent labs & imaging results that were available during my care of the patient were reviewed by me and considered in my medical decision making (see chart for details).    Patient is well-appearing, normotensive, afebrile, not tachycardic, not tachypneic, oxygenating well on room air.  Examination is reassuring.  She has full range of motion of both lower extremities and is able to bear weight.  No bruising, erythema, or swelling of her joints.  X-ray imaging deferred.  Suspect muscular cause.  Urine pregnancy test negative-treat with Tylenol extra strength every 4-6 hours as needed for pain along with muscle relaxant at nighttime as needed for pain.  Suspect discoloration on toenail is secondary to trauma from fall.  Encouraged her to closely watch this and with any worsening, follow-up with primary care provider.  Note given for work.  ER and return precautions discussed.  The patient was given the opportunity to ask questions.  All questions answered to their satisfaction.  The patient is in agreement to this plan.    Final Clinical Impressions(s) / UC Diagnoses   Final diagnoses:  Fall, initial encounter  Pain in both lower extremities     Discharge Instructions      The pain you are having in your legs is likely in your muscles from the fall yesterday.  Please take Tylenol 500 to 1000 mg every 4-6 hours as needed for pain.  At  nighttime, you can use the tizanidine I sent to the pharmacy which is a muscle relaxant and should help with the muscle pain.  Pregnancy test today is negative.  If pain does not improve with this regimen, follow-up with primary care provider.  If pain worsens, go to ER.     ED Prescriptions     Medication Sig Dispense Auth. Provider   tiZANidine (ZANAFLEX) 4 MG tablet Take 1 tablet (4 mg total) by mouth at bedtime as needed for  muscle spasms. Do not take with alcohol or while driving or operating heavy machinery.  May cause drowsiness. 30 tablet Valentino Nose, NP      PDMP not reviewed this encounter.   Valentino Nose, NP 03/14/22 1807    Valentino Nose, NP 03/14/22 Silva Bandy

## 2022-03-14 NOTE — Discharge Instructions (Addendum)
The pain you are having in your legs is likely in your muscles from the fall yesterday.  Please take Tylenol 500 to 1000 mg every 4-6 hours as needed for pain.  At nighttime, you can use the tizanidine I sent to the pharmacy which is a muscle relaxant and should help with the muscle pain.  Pregnancy test today is negative.  If pain does not improve with this regimen, follow-up with primary care provider.  If pain worsens, go to ER.

## 2022-03-14 NOTE — ED Triage Notes (Signed)
PT fell yesterday morning on stairs while at work. Pt reports pain to bil leg,and feet pain after fall. Pt took ibuprofen .

## 2022-03-29 ENCOUNTER — Ambulatory Visit (HOSPITAL_COMMUNITY)
Admission: EM | Admit: 2022-03-29 | Discharge: 2022-03-29 | Disposition: A | Discharge status: Home / Self Care | Payer: Self-pay | Attending: Emergency Medicine | Admitting: Emergency Medicine

## 2022-03-29 ENCOUNTER — Encounter (HOSPITAL_COMMUNITY): Payer: Self-pay | Admitting: Emergency Medicine

## 2022-03-29 ENCOUNTER — Other Ambulatory Visit: Payer: Self-pay

## 2022-03-29 DIAGNOSIS — H6690 Otitis media, unspecified, unspecified ear: Secondary | ICD-10-CM

## 2022-03-29 DIAGNOSIS — H6692 Otitis media, unspecified, left ear: Secondary | ICD-10-CM

## 2022-03-29 MED ORDER — ALBUTEROL SULFATE HFA 108 (90 BASE) MCG/ACT IN AERS
INHALATION_SPRAY | RESPIRATORY_TRACT | Status: AC
  Filled 2022-03-29: qty 6.7

## 2022-03-29 MED ORDER — AMOXICILLIN-POT CLAVULANATE 875-125 MG PO TABS: 1.0000 | ORAL_TABLET | Freq: Two times a day (BID) | ORAL | 0 refills | Status: DC

## 2022-03-29 MED ORDER — ALBUTEROL SULFATE HFA 108 (90 BASE) MCG/ACT IN AERS
2.0000 | INHALATION_SPRAY | Freq: Once | RESPIRATORY_TRACT | Status: AC
  Administered 2022-03-29: 2 via RESPIRATORY_TRACT

## 2022-03-29 MED ORDER — ACETAMINOPHEN 325 MG PO TABS
975.0000 mg | ORAL_TABLET | Freq: Once | ORAL | Status: AC
  Administered 2022-03-29: 975 mg via ORAL

## 2022-03-29 MED ORDER — ACETAMINOPHEN 325 MG PO TABS
ORAL_TABLET | ORAL | Status: AC
  Filled 2022-03-29: qty 3

## 2022-03-29 NOTE — Discharge Instructions (Signed)
Augmentin antibiotic has been sent to the pharmacy, take this medication 2 times daily for the next 7 days. I recommend that you take this medication with food on your stomach because it can cause stomach upset.   Albuterol inhaler has been given to you in office today, use this medication every 4-6 hours as needed for shortness of breath and cough.   I have put a notification into the system for someone to contact you for a PCP.   [facility] ([facility]) is a resource that may be able to help you with some of the issues we discussed.  Their phone number is [phone].  Please give them a call, I hope this helps.

## 2022-03-29 NOTE — ED Triage Notes (Signed)
Left ear pain that started last night.  Patient has had uri symptoms for 1-2 weeks.    Has had hot episodes, generalized soreness.  Reports nausea.  No vomiting.

## 2022-03-29 NOTE — ED Provider Notes (Signed)
[place]    CSN: [id number] Arrival date & time: 03/29/22  1251      History   Chief Complaint Chief Complaint  Patient presents with   Cough    HPI Cheryl Fitzgerald is a 26 y.o. female.  Patient complaining of left ear pain that started last night.  Patient reports having productive cough and generalized body aches over the past week.  Patient endorses onset of symptoms a week ago began with sore throat and  fever that has now resolved.  Patient reports tinnitus and muffled sounds in left ear.  Patient reports shortness of breath upon exertion at times . Patient has been taking over-the-counter medications with no relief of symptoms.  Patient denies a history of asthma.   Patient reports living in a insect infested apartment.  Patient reports that she has made her landlord aware and they refused to help with the sick infestation.  Patient reports having some difficulties during her life due to immigrating to [place] as a child.  Patient reports that her mother was unable to provide her with healthcare during her childhood and now she is uninsured.  Patient is very tearful during visit.    Cough Associated symptoms: ear pain, fever (Upon onset of symptoms), myalgias, rhinorrhea and shortness of breath   Associated symptoms: no chest pain, no chills and no wheezing     Past Medical History:  Diagnosis Date   Adjustment disorder with mixed emotional features 03/23/2015   Anxiety    Depression    no medication   Shortness of breath dyspnea     Patient Active Problem List   Diagnosis Date Noted   Toe pain, bilateral 12/27/2020   Chronic diarrhea 10/31/2020   Generalized anxiety disorder 10/31/2020   History of cholecystectomy 10/31/2020   Axillary hidradenitis suppurativa 11/29/2016   Cellulitis of left arm    Acquired hyperbilirubinemia    Choledocholithiasis    Non-specific filling defect of common bile duct    Calculous cholecystitis with obstruction  08/08/2015   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   Post-dates pregnancy 04/05/2015   Adjustment disorder with mixed emotional features 03/23/2015   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   Inadequate housing 03/22/2015   Food hunger 03/22/2015    Past Surgical History:  Procedure Laterality Date   CHOLECYSTECTOMY N/A 08/08/2015   Procedure: LAPAROSCOPIC CHOLECYSTECTOMY WITH INTRAOPERATIVE CHOLANGIOGRAM;  Surgeon: Jules Husbands, MD;  Location: [facility];  Service: General;  Laterality: N/A;   ENDOSCOPIC RETROGRADE CHOLANGIOPANCREATOGRAPHY (ERCP) WITH PROPOFOL N/A 08/09/2015   Procedure: ENDOSCOPIC RETROGRADE CHOLANGIOPANCREATOGRAPHY (ERCP) WITH PROPOFOL;  Surgeon: Lucilla Lame, MD;  Location: [facility];  Service: Endoscopy;  Laterality: N/A;   NO PAST SURGERIES     VAGINAL DELIVERY  04/07/2015    OB History     Gravida  2   Para  1   Term  1   Preterm      AB  0   Living  1      SAB  0   IAB      Ectopic      Multiple  0   Live Births  1            Home Medications    Prior to Admission medications   Medication Sig Start Date End Date Taking? Authorizing Provider  amoxicillin-clavulanate (AUGMENTIN) 875-125 MG tablet Take 1 tablet by mouth every 12 (twelve) hours. 03/29/22  Yes Flossie Dibble, NP  Acetaminophen (MIDOL PO) Take 1-2 tablets  by mouth 2 (two) times daily as needed (pain).    [provider]  dicyclomine (BENTYL) 20 MG tablet Take 1 tablet (20 mg total) by mouth every 8 (eight) hours as needed for spasms. 02/22/21   Petrucelli, Samantha R, PA-C  hydrOXYzine (ATARAX) 25 MG tablet Take 1 tablet (25 mg total) by mouth every 6 (six) hours. 02/17/22   Redwine, Madison A, PA-C  Multiple Vitamin (MULTIVITAMIN) tablet Take 1 tablet by mouth daily.    [provider]  naproxen (NAPROSYN) 375 MG tablet Take 1 tablet (375 mg total) by mouth 2 (two) times daily as needed for moderate pain. 02/22/21   Petrucelli,  Samantha R, PA-C  ondansetron (ZOFRAN ODT) 4 MG disintegrating tablet Take 1 tablet (4 mg total) by mouth every 8 (eight) hours as needed for nausea or vomiting. 02/22/21   Petrucelli, Samantha R, PA-C  tiZANidine (ZANAFLEX) 4 MG tablet Take 1 tablet (4 mg total) by mouth at bedtime as needed for muscle spasms. Do not take with alcohol or while driving or operating heavy machinery.  May cause drowsiness. 03/14/22   Valentino Nose, NP    Family History Family History  Problem Relation Age of Onset   Diabetes Mother    Hyperlipidemia Mother    Hypertension Mother    Arthritis Father    Depression Father    Breast cancer Maternal Grandmother    Diabetes Maternal Grandmother     Social History Social History   Tobacco Use   Smoking status: Former    Packs/day: 0.10    Types: Cigarettes   Smokeless tobacco: Never  Vaping Use   Vaping Use: Former  Substance Use Topics   Alcohol use: Not Currently   Drug use: Not Currently    Types: Marijuana     Allergies   Gramineae pollens and Peanut oil   Review of Systems Review of Systems  Constitutional:  Positive for fatigue and fever (Upon onset of symptoms). Negative for activity change, appetite change and chills.  HENT:  Positive for ear pain, postnasal drip, rhinorrhea and tinnitus. Negative for congestion, ear discharge, sinus pressure and sinus pain.   Respiratory:  Positive for cough and shortness of breath. Negative for chest tightness and wheezing.   Cardiovascular:  Negative for chest pain and palpitations.  Gastrointestinal:  Negative for diarrhea, nausea and vomiting.  Musculoskeletal:  Positive for myalgias.     Physical Exam Triage Vital Signs ED Triage Vitals  Enc Vitals Group     BP 03/29/22 1345 121/73     Pulse Rate 03/29/22 1345 94     Resp 03/29/22 1345 18     Temp 03/29/22 1345 98.4 F (36.9 C)     Temp Source 03/29/22 1345 Oral     SpO2 03/29/22 1345 97 %     Weight --      Height --      Head  Circumference --      Peak Flow --      Pain Score 03/29/22 1342 10     Pain Loc --      Pain Edu? --      Excl. in GC? --    No data found.  Updated Vital Signs BP 121/73 (BP Location: Right Arm)   Pulse 94   Temp 98.4 F (36.9 C) (Oral)   Resp 18   LMP 02/18/2022   SpO2 97%      Physical Exam Vitals and nursing note reviewed.  HENT:  Right Ear: Hearing, ear canal and external ear normal. No tenderness. Tympanic membrane is bulging. Tympanic membrane is not injected or erythematous.     Left Ear: Ear canal normal. Decreased hearing noted. Tenderness present. Tympanic membrane is erythematous and bulging.     Nose: Rhinorrhea present. No nasal tenderness or congestion. Rhinorrhea is clear.     Right Turbinates: Not enlarged, swollen or pale.     Left Turbinates: Not enlarged, swollen or pale.     Right Sinus: No maxillary sinus tenderness or frontal sinus tenderness.     Left Sinus: No maxillary sinus tenderness or frontal sinus tenderness.     Mouth/Throat:     Mouth: Mucous membranes are moist.     Dentition: Abnormal dentition. Dental caries present.     Pharynx: Oropharynx is clear. No pharyngeal swelling, oropharyngeal exudate, posterior oropharyngeal erythema or uvula swelling.     Tonsils: No tonsillar exudate or tonsillar abscesses. 0 on the right. 0 on the left.  Cardiovascular:     Rate and Rhythm: Normal rate and regular rhythm.     Heart sounds: Normal heart sounds, S1 normal and S2 normal.  Pulmonary:     Effort: Pulmonary effort is normal.     Breath sounds: Examination of the left-middle field reveals wheezing. Examination of the left-lower field reveals wheezing. Wheezing present. No decreased breath sounds, rhonchi or rales.  Lymphadenopathy:     Head:     Right side of head: No occipital adenopathy.     Left side of head: No occipital adenopathy.     Cervical: Cervical adenopathy present.     Right cervical: No superficial, deep or posterior cervical  adenopathy.    Left cervical: Superficial cervical adenopathy present. No deep or posterior cervical adenopathy.  Neurological:     Mental Status: She is alert.      UC Treatments / Results  Labs (all labs ordered are listed, but only abnormal results are displayed) Labs Reviewed - No data to display  EKG   Radiology No results found.  Procedures Procedures (including critical care time)  Medications Ordered in UC Medications  acetaminophen (TYLENOL) tablet 975 mg (has no administration in time range)  albuterol (VENTOLIN HFA) 108 (90 Base) MCG/ACT inhaler 2 puff (has no administration in time range)    Initial Impression / Assessment and Plan / UC Course  I have reviewed the triage vital signs and the nursing notes.  Pertinent labs & imaging results that were available during my care of the patient were reviewed by me and considered in my medical decision making (see chart for details).     Patient was treated for acute otitis media of the left ear.  Augmentin was sent to the pharmacy, patient was made aware of medication regiment and potential side effects.  Due to symptomology and assessment patient was given albuterol inhaler in office, patient was educated on how to use this inhaler.  PCP notification was put in system for someone to contact the patient due to her having some financial hardship.  Patient was given case management resource to help with her SDOH aspects that are causing hardship.  Patient verbalized understanding of instructions.  Final Clinical Impressions(s) / UC Diagnoses   Final diagnoses:  Acute otitis media, unspecified otitis media type     Discharge Instructions      Augmentin antibiotic has been sent to the pharmacy, take this medication 2 times daily for the next 7 days. I recommend that you take this  medication with food on your stomach because it can cause stomach upset.   Albuterol inhaler has been given to you in office today, use this  medication every 4-6 hours as needed for shortness of breath and cough.   I have put a notification into the system for someone to contact you for a PCP.   [facility] (Case Management) is a resource that may be able to help you with some of the issues we discussed.  Their phone number is [phone].  Please give them a call, I hope this helps.       ED Prescriptions     Medication Sig Dispense Auth. Provider   amoxicillin-clavulanate (AUGMENTIN) 875-125 MG tablet Take 1 tablet by mouth every 12 (twelve) hours. 14 tablet Debby Freiberg, NP      PDMP not reviewed this encounter.   Debby Freiberg, NP 03/29/22 1442

## 2022-04-12 ENCOUNTER — Ambulatory Visit (HOSPITAL_COMMUNITY)
Admission: EM | Admit: 2022-04-12 | Discharge: 2022-04-12 | Disposition: A | Discharge status: Home / Self Care | Payer: Self-pay | Attending: Emergency Medicine | Admitting: Emergency Medicine

## 2022-04-12 ENCOUNTER — Ambulatory Visit (INDEPENDENT_AMBULATORY_CARE_PROVIDER_SITE_OTHER): Payer: Self-pay

## 2022-04-12 ENCOUNTER — Other Ambulatory Visit: Payer: Self-pay

## 2022-04-12 ENCOUNTER — Encounter (HOSPITAL_COMMUNITY): Payer: Self-pay | Admitting: *Deleted

## 2022-04-12 DIAGNOSIS — M2142 Flat foot [pes planus] (acquired), left foot: Secondary | ICD-10-CM

## 2022-04-12 DIAGNOSIS — M79671 Pain in right foot: Secondary | ICD-10-CM

## 2022-04-12 DIAGNOSIS — M2141 Flat foot [pes planus] (acquired), right foot: Secondary | ICD-10-CM

## 2022-04-12 DIAGNOSIS — M79672 Pain in left foot: Secondary | ICD-10-CM

## 2022-04-12 DIAGNOSIS — W19XXXA Unspecified fall, initial encounter: Secondary | ICD-10-CM

## 2022-04-12 MED ORDER — IBUPROFEN 800 MG PO TABS
800.0000 mg | ORAL_TABLET | Freq: Once | ORAL | Status: AC
  Administered 2022-04-12: 800 mg via ORAL

## 2022-04-12 MED ORDER — TIZANIDINE HCL 4 MG PO TABS: 4.0000 mg | ORAL_TABLET | Freq: Every evening | ORAL | 0 refills | Status: AC | PRN

## 2022-04-12 MED ORDER — IBUPROFEN 800 MG PO TABS
ORAL_TABLET | ORAL | Status: AC
  Filled 2022-04-12: qty 1

## 2022-04-12 NOTE — ED Provider Notes (Signed)
[facility]    CSN: [id number] Arrival date & time: 04/12/22  1353     History   Chief Complaint Chief Complaint  Patient presents with   Fall    HPI Cheryl Fitzgerald is a 26 y.o. female.  Presents with bilateral leg and foot pain  Reports she slipped at work 6 days ago Both her LE have been hurting 10/10 pain, especially in the heels. More so in the right foot. She has been walking since the injury but reports a limp  Feels steel toe boots are the cause, wears at work  Denies numbness or tingling of the feet and legs.  Has tizanidine from previous foot/leg pain that she takes at night, feels this helps Tried 200 mg ibuprofen   Past Medical History:  Diagnosis Date   Adjustment disorder with mixed emotional features 03/23/2015   Anxiety    Depression    no medication   Shortness of breath dyspnea     Patient Active Problem List   Diagnosis Date Noted   Toe pain, bilateral 12/27/2020   Chronic diarrhea 10/31/2020   Generalized anxiety disorder 10/31/2020   History of cholecystectomy 10/31/2020   Axillary hidradenitis suppurativa 11/29/2016   Cellulitis of left arm    Acquired hyperbilirubinemia    Choledocholithiasis    Non-specific filling defect of common bile duct    Calculous cholecystitis with obstruction 08/08/2015   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   Post-dates pregnancy 04/05/2015   Adjustment disorder with mixed emotional features 03/23/2015   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   Inadequate housing 03/22/2015   Food hunger 03/22/2015    Past Surgical History:  Procedure Laterality Date   CHOLECYSTECTOMY N/A 08/08/2015   Procedure: LAPAROSCOPIC CHOLECYSTECTOMY WITH INTRAOPERATIVE CHOLANGIOGRAM;  Surgeon: Leafy Ro, MD;  Location: [facility];  Service: General;  Laterality: N/A;   ENDOSCOPIC RETROGRADE CHOLANGIOPANCREATOGRAPHY (ERCP) WITH PROPOFOL N/A 08/09/2015   Procedure: ENDOSCOPIC RETROGRADE  CHOLANGIOPANCREATOGRAPHY (ERCP) WITH PROPOFOL;  Surgeon: Midge Minium, MD;  Location: [facility];  Service: Endoscopy;  Laterality: N/A;   NO PAST SURGERIES     VAGINAL DELIVERY  04/07/2015    OB History     Gravida  2   Para  1   Term  1   Preterm      AB  0   Living  1      SAB  0   IAB      Ectopic      Multiple  0   Live Births  1            Home Medications    Prior to Admission medications   Medication Sig Start Date End Date Taking? Authorizing Provider  Acetaminophen (MIDOL PO) Take 1-2 tablets by mouth 2 (two) times daily as needed (pain).    [provider]  dicyclomine (BENTYL) 20 MG tablet Take 1 tablet (20 mg total) by mouth every 8 (eight) hours as needed for spasms. 02/22/21   Petrucelli, Samantha R, PA-C  hydrOXYzine (ATARAX) 25 MG tablet Take 1 tablet (25 mg total) by mouth every 6 (six) hours. 02/17/22   Redwine, Madison A, PA-C  Multiple Vitamin (MULTIVITAMIN) tablet Take 1 tablet by mouth daily.    [provider]  ondansetron (ZOFRAN ODT) 4 MG disintegrating tablet Take 1 tablet (4 mg total) by mouth every 8 (eight) hours as needed for nausea or vomiting. 02/22/21   Petrucelli, Samantha R, PA-C  tiZANidine (ZANAFLEX) 4 MG tablet Take 1 tablet (  4 mg total) by mouth at bedtime as needed for up to 5 days for muscle spasms. Do not take with alcohol or while driving or operating heavy machinery.  May cause drowsiness. 04/12/22 04/17/22  Chaim Gatley, Lurena Joiner PA-C    Family History Family History  Problem Relation Age of Onset   Diabetes Mother    Hyperlipidemia Mother    Hypertension Mother    Arthritis Father    Depression Father    Breast cancer Maternal Grandmother    Diabetes Maternal Grandmother     Social History Social History   Tobacco Use   Smoking status: Former    Packs/day: 0.10    Types: Cigarettes   Smokeless tobacco: Never  Vaping Use   Vaping Use: Former  Substance Use Topics   Alcohol use: Not  Currently   Drug use: Not Currently    Types: Marijuana     Allergies   Gramineae pollens and Peanut oil   Review of Systems Review of Systems Per HPI  Physical Exam Triage Vital Signs ED Triage Vitals  Enc Vitals Group     BP 04/12/22 1554 115/78     Pulse Rate 04/12/22 1554 89     Resp 04/12/22 1554 18     Temp 04/12/22 1554 98.2 F (36.8 C)     Temp src --      SpO2 --      Weight --      Height --      Head Circumference --      Peak Flow --      Pain Score 04/12/22 1552 10     Pain Loc --      Pain Edu? --      Excl. in GC? --    No data found.  Updated Vital Signs BP 115/78   Pulse 89   Temp 98.2 F (36.8 C)   Resp 18   LMP  (LMP Unknown)    Physical Exam Vitals and nursing note reviewed.  Constitutional:      General: She is not in acute distress. Cardiovascular:     Rate and Rhythm: Normal rate and regular rhythm.  Pulmonary:     Effort: Pulmonary effort is normal.  Musculoskeletal:        General: No swelling or tenderness. Normal range of motion.     Comments: No obvious deformity or discoloration of the bilateral lower extremities.  Sensation is intact throughout.  She has full range of motion.  Nontender with exam.  Strong pedal pulses. No pain or swelling in calves  Feet:     Comments: Flat feet Skin:    General: Skin is warm and dry.     Findings: No bruising, erythema or rash.  Neurological:     General: No focal deficit present.     Mental Status: She is alert and oriented to person, place, and time.     Sensory: Sensation is intact.     Motor: Motor function is intact.     Coordination: Coordination is intact.     Gait: Gait is intact.     Comments: Ambulated into clinic without difficulty      UC Treatments / Results  Labs (all labs ordered are listed, but only abnormal results are displayed) Labs Reviewed - No data to display  EKG  Radiology DG Foot Complete Right  Result Date: 04/12/2022 CLINICAL DATA:  Right foot  pain after fall EXAM: RIGHT FOOT COMPLETE - 3+ VIEW COMPARISON:  None Available.  FINDINGS: Small bony fragment is seen along the lateral margin of the first metatarsal base, suspicious for a small avulsion fracture. There is a 5 mm fleck of bone seen at the dorsal aspect of the forefoot at the proximal metatarsal level seen only on lateral view, suspicious for a small cortical avulsion fragment. No additional fractures are seen. No malalignment. Mild soft tissue swelling. IMPRESSION: Findings suspicious for small avulsion fractures involving the first metatarsal base and dorsal aspect of the forefoot forefoot. Correlate with point tenderness and consider further evaluation with CT as findings raise the possibility of a potential Lisfranc ligament injury. Electronically Signed   By: Davina Poke D.O.   On: 04/12/2022 17:12    Procedures Procedures   Medications Ordered in UC Medications  ibuprofen (ADVIL) tablet 800 mg (800 mg Oral Given 04/12/22 1642)    Initial Impression / Assessment and Plan / UC Course  I have reviewed the triage vital signs and the nursing notes.  Pertinent labs & imaging results that were available during my care of the patient were reviewed by me and considered in my medical decision making (see chart for details).  Patient is nontender on exam, however complaining of 10/10 pain.  I did offer x-ray and patient would like to proceed. Right foot x-ray read by radiology suspicious for small avulsion fractures, recommend to correlate with point tenderness and consider CT. No prior images available for comparison.   Patient is nontender with exam, same on re-exam, and has normal gait, pain improved with ibuprofen today. At this point recommend to see a specialist for further evaluation who can then order outpatient CT if needed. We did discuss ED precautions.  Refilled Zanaflex per request, can try at night Discussed increase ibuprofen dose to 800 mg q6 hours Recommend  shoe inserts/heel cushions for work Would like her to follow up with podiatry. [facility] Return precautions discussed. Patient agrees to plan  Final Clinical Impressions(s) / UC Diagnoses   Final diagnoses:  Bilateral foot pain  Flat feet, bilateral     Discharge Instructions      X-ray may have some acute changes related to a ligament injury - this would need to be further evaluated with CT scan if symptoms persist.  At this time I recommend using ibuprofen up to 800 mg every 6 hours as needed for pain. You can continue the muscle relaxer at night if this helps.  I recommend seeing a foot specialist regarding your symptoms. You may have underlying problem that is contributing to your foot pain today, or new ligament injury. You have seen [facility] in the past, I have attached their information for you to follow up with. Call to make an appointment.  Go to the emergency department if symptoms worsen or you are unable to bear weight on the feet.     ED Prescriptions     Medication Sig Dispense Auth. Provider   tiZANidine (ZANAFLEX) 4 MG tablet Take 1 tablet (4 mg total) by mouth at bedtime as needed for up to 5 days for muscle spasms. Do not take with alcohol or while driving or operating heavy machinery.  May cause drowsiness. 5 tablet Darionna Banke, Wells Guiles, PA-C      PDMP not reviewed this encounter.   Danay Mckellar, Vernice Jefferson 04/12/22 1727

## 2022-04-12 NOTE — Discharge Instructions (Addendum)
X-ray may have some acute changes related to a ligament injury - this would need to be further evaluated with CT scan if symptoms persist.  At this time I recommend using ibuprofen up to 800 mg every 6 hours as needed for pain. You can continue the muscle relaxer at night if this helps.  I recommend seeing a foot specialist regarding your symptoms. You may have underlying problem that is contributing to your foot pain today, or new ligament injury. You have seen [facility] in the past, I have attached their information for you to follow up with. Call to make an appointment.  Go to the emergency department if symptoms worsen or you are unable to bear weight on the feet.

## 2022-04-12 NOTE — ED Triage Notes (Signed)
PT reports she fell at work last Monday. Pt reports she has bil leg pain down to her feet.

## 2022-04-26 ENCOUNTER — Encounter (HOSPITAL_COMMUNITY): Payer: Self-pay

## 2022-04-26 ENCOUNTER — Ambulatory Visit (HOSPITAL_COMMUNITY)
Admission: EM | Admit: 2022-04-26 | Discharge: 2022-04-26 | Disposition: A | Discharge status: Home / Self Care | Payer: Self-pay | Attending: Family Medicine | Admitting: Family Medicine

## 2022-04-26 DIAGNOSIS — R102 Pelvic and perineal pain: Secondary | ICD-10-CM | POA: Insufficient documentation

## 2022-04-26 DIAGNOSIS — R300 Dysuria: Secondary | ICD-10-CM | POA: Insufficient documentation

## 2022-04-26 DIAGNOSIS — N3090 Cystitis, unspecified without hematuria: Secondary | ICD-10-CM | POA: Insufficient documentation

## 2022-04-26 LAB — POCT URINALYSIS DIPSTICK, ED / UC
Bilirubin Urine: NEGATIVE
Glucose, UA: NEGATIVE mg/dL
Hgb urine dipstick: NEGATIVE
Leukocytes,Ua: NEGATIVE
Nitrite: NEGATIVE
Protein, ur: NEGATIVE mg/dL
Specific Gravity, Urine: 1.02 (ref 1.005–1.030)
Urobilinogen, UA: 0.2 mg/dL (ref 0.0–1.0)
pH: 5 (ref 5.0–8.0)

## 2022-04-26 MED ORDER — CIPROFLOXACIN HCL 500 MG PO TABS: 500.0000 mg | ORAL_TABLET | Freq: Two times a day (BID) | ORAL | 0 refills | Status: AC

## 2022-04-26 NOTE — Discharge Instructions (Addendum)
Urinalysis did not have any blood on it Urine culture is sent and staff will notify you if there is anything positive on that or on the swab needed  Take Cipro 500 mg--1 tablet 2 times daily for 7 days

## 2022-04-26 NOTE — ED Provider Notes (Signed)
[facility]    CSN: [id number] Arrival date & time: 04/26/22  1444      History   Chief Complaint Chief Complaint  Patient presents with   Dysuria    HPI Cheryl Fitzgerald is a 26 y.o. female.    Dysuria  Here for suprapubic pain and pain on urination.  It started in the last few days.  She does have urinary frequency also.  No fever or chills or nausea or vomiting.  Last menstrual cycle was 2 weeks ago, and she is on some birth control  Past Medical History:  Diagnosis Date   Adjustment disorder with mixed emotional features 03/23/2015   Anxiety    Depression    no medication   Shortness of breath dyspnea     Patient Active Problem List   Diagnosis Date Noted   Toe pain, bilateral 12/27/2020   Chronic diarrhea 10/31/2020   Generalized anxiety disorder 10/31/2020   History of cholecystectomy 10/31/2020   Axillary hidradenitis suppurativa 11/29/2016   Cellulitis of left arm    Acquired hyperbilirubinemia    Choledocholithiasis    Non-specific filling defect of common bile duct    Calculous cholecystitis with obstruction 08/08/2015   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   Post-dates pregnancy 04/05/2015   Adjustment disorder with mixed emotional features 03/23/2015   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   Inadequate housing 03/22/2015   Food hunger 03/22/2015    Past Surgical History:  Procedure Laterality Date   CHOLECYSTECTOMY N/A 08/08/2015   Procedure: LAPAROSCOPIC CHOLECYSTECTOMY WITH INTRAOPERATIVE CHOLANGIOGRAM;  Surgeon: Jules Husbands, MD;  Location: [facility];  Service: General;  Laterality: N/A;   ENDOSCOPIC RETROGRADE CHOLANGIOPANCREATOGRAPHY (ERCP) WITH PROPOFOL N/A 08/09/2015   Procedure: ENDOSCOPIC RETROGRADE CHOLANGIOPANCREATOGRAPHY (ERCP) WITH PROPOFOL;  Surgeon: Lucilla Lame, MD;  Location: [facility];  Service: Endoscopy;  Laterality: N/A;   NO PAST SURGERIES     VAGINAL DELIVERY  04/07/2015    OB History      Gravida  2   Para  1   Term  1   Preterm      AB  0   Living  1      SAB  0   IAB      Ectopic      Multiple  0   Live Births  1            Home Medications    Prior to Admission medications   Medication Sig Start Date End Date Taking? Authorizing Provider  ciprofloxacin (CIPRO) 500 MG tablet Take 1 tablet (500 mg total) by mouth 2 (two) times daily for 7 days. 04/26/22 05/03/22 Yes Irish Piech, Gwenlyn Perking, MD  Acetaminophen (MIDOL PO) Take 1-2 tablets by mouth 2 (two) times daily as needed (pain).    [provider]  dicyclomine (BENTYL) 20 MG tablet Take 1 tablet (20 mg total) by mouth every 8 (eight) hours as needed for spasms. 02/22/21   Petrucelli, Samantha R, PA-C  hydrOXYzine (ATARAX) 25 MG tablet Take 1 tablet (25 mg total) by mouth every 6 (six) hours. 02/17/22   Redwine, Madison A, PA-C  Multiple Vitamin (MULTIVITAMIN) tablet Take 1 tablet by mouth daily.    [provider]    Family History Family History  Problem Relation Age of Onset   Diabetes Mother    Hyperlipidemia Mother    Hypertension Mother    Arthritis Father    Depression Father    Breast cancer Maternal Grandmother    Diabetes  Maternal Grandmother     Social History Social History   Tobacco Use   Smoking status: Former    Packs/day: 0.10    Types: Cigarettes   Smokeless tobacco: Never  Vaping Use   Vaping Use: Former  Substance Use Topics   Alcohol use: Not Currently   Drug use: Not Currently    Types: Marijuana     Allergies   Gramineae pollens and Peanut oil   Review of Systems Review of Systems  Genitourinary:  Positive for dysuria.     Physical Exam Triage Vital Signs ED Triage Vitals [04/26/22 1533]  Enc Vitals Group     BP 120/76     Pulse Rate 92     Resp 18     Temp 98.1 F (36.7 C)     Temp Source Oral     SpO2 98 %     Weight      Height      Head Circumference      Peak Flow      Pain Score      Pain Loc      Pain Edu?       Excl. in [place]?    No data found.  Updated Vital Signs BP 120/76 (BP Location: Left Arm)   Pulse 92   Temp 98.1 F (36.7 C) (Oral)   Resp 18   LMP  (LMP Unknown)   SpO2 98%   Visual Acuity Right Eye Distance:   Left Eye Distance:   Bilateral Distance:    Right Eye Near:   Left Eye Near:    Bilateral Near:     Physical Exam Vitals reviewed.  Constitutional:      General: She is not in acute distress.    Appearance: She is not ill-appearing, toxic-appearing or diaphoretic.  HENT:     Mouth/Throat:     Mouth: Mucous membranes are moist.  Eyes:     Extraocular Movements: Extraocular movements intact.     Pupils: Pupils are equal, round, and reactive to light.  Cardiovascular:     Rate and Rhythm: Normal rate and regular rhythm.     Heart sounds: No murmur heard. Pulmonary:     Effort: Pulmonary effort is normal.     Breath sounds: Normal breath sounds.  Abdominal:     Palpations: Abdomen is soft.     Tenderness: There is abdominal tenderness (suprapubic).  Skin:    Coloration: Skin is not jaundiced or pale.  Neurological:     General: No focal deficit present.     Mental Status: She is alert and oriented to person, place, and time.  Psychiatric:        Behavior: Behavior normal.      UC Treatments / Results  Labs (all labs ordered are listed, but only abnormal results are displayed) Labs Reviewed  POCT URINALYSIS DIPSTICK, ED / UC - Abnormal; Notable for the following components:      Result Value   Ketones, ur TRACE (*)    All other components within normal limits  URINE CULTURE  CERVICOVAGINAL ANCILLARY ONLY    EKG   Radiology No results found.  Procedures Procedures (including critical care time)  Medications Ordered in UC Medications - No data to display  Initial Impression / Assessment and Plan / UC Course  I have reviewed the triage vital signs and the nursing notes.  Pertinent labs & imaging results that were available during my  care of the patient were reviewed by  me and considered in my medical decision making (see chart for details).        UA shows trace of ketones but no blood or leuks or nitrites.  Self vaginal swab is done and we will notify her of any positives on that.  Urine culture is sent in Cipro is sent to the pharmacy to treat possible bladder infection Final Clinical Impressions(s) / UC Diagnoses   Final diagnoses:  Dysuria  Cystitis  Pelvic pain     Discharge Instructions      Urinalysis did not have any blood on it Urine culture is sent and staff will notify you if there is anything positive on that or on the swab needed  Take Cipro 500 mg--1 tablet 2 times daily for 7 days       ED Prescriptions     Medication Sig Dispense Auth. Provider   ciprofloxacin (CIPRO) 500 MG tablet Take 1 tablet (500 mg total) by mouth 2 (two) times daily for 7 days. 14 tablet Champayne Kocian, Janace Aris, MD      PDMP not reviewed this encounter.   Zenia Resides, MD 04/26/22 1626

## 2022-04-26 NOTE — ED Triage Notes (Signed)
C/o dysuria for several days. C/o lower abdominal pain.

## 2022-04-27 LAB — URINE CULTURE

## 2022-04-27 LAB — CERVICOVAGINAL ANCILLARY ONLY
Bacterial Vaginitis (gardnerella): NEGATIVE
Candida Glabrata: NEGATIVE
Candida Vaginitis: NEGATIVE
Chlamydia: NEGATIVE
Comment: NEGATIVE
Comment: NEGATIVE
Comment: NEGATIVE
Comment: NEGATIVE
Comment: NEGATIVE
Comment: NORMAL
Neisseria Gonorrhea: NEGATIVE
Trichomonas: NEGATIVE

## 2022-04-29 ENCOUNTER — Telehealth (HOSPITAL_COMMUNITY): Payer: Self-pay | Admitting: Emergency Medicine

## 2022-04-29 NOTE — Telephone Encounter (Signed)
Patient concerned about the cost associated with her antibiotics with a "multiple species" result on her culture.  Reviewed with Dr. Marlinda Mike who recommended a new collection of her sample, using two wipes prior to urinating.  Reviewed with patient, she states she will return this afternoon

## 2022-05-25 ENCOUNTER — Ambulatory Visit (HOSPITAL_COMMUNITY)
Admission: EM | Admit: 2022-05-25 | Discharge: 2022-05-25 | Disposition: A | Discharge status: Home / Self Care | Payer: Self-pay | Attending: Internal Medicine | Admitting: Internal Medicine

## 2022-05-26 ENCOUNTER — Encounter (HOSPITAL_COMMUNITY): Payer: Self-pay

## 2022-05-26 ENCOUNTER — Ambulatory Visit (HOSPITAL_COMMUNITY)
Admission: EM | Admit: 2022-05-26 | Discharge: 2022-05-26 | Disposition: A | Discharge status: Home / Self Care | Payer: Self-pay | Attending: Internal Medicine | Admitting: Internal Medicine

## 2022-05-26 VITALS — BP 139/85 | HR 72 | Temp 98.3°F | Resp 18

## 2022-05-26 DIAGNOSIS — N898 Other specified noninflammatory disorders of vagina: Secondary | ICD-10-CM | POA: Insufficient documentation

## 2022-05-26 DIAGNOSIS — R350 Frequency of micturition: Secondary | ICD-10-CM | POA: Insufficient documentation

## 2022-05-26 DIAGNOSIS — Z3202 Encounter for pregnancy test, result negative: Secondary | ICD-10-CM

## 2022-05-26 DIAGNOSIS — N941 Unspecified dyspareunia: Secondary | ICD-10-CM | POA: Insufficient documentation

## 2022-05-26 LAB — POCT URINALYSIS DIPSTICK, ED / UC
Bilirubin Urine: NEGATIVE
Glucose, UA: NEGATIVE mg/dL
Hgb urine dipstick: NEGATIVE
Ketones, ur: NEGATIVE mg/dL
Leukocytes,Ua: NEGATIVE
Nitrite: NEGATIVE
Protein, ur: NEGATIVE mg/dL
Specific Gravity, Urine: <=1.005 (ref 1.005–1.030)
Urobilinogen, UA: 0.2 mg/dL (ref 0.0–1.0)
pH: 5.5 (ref 5.0–8.0)

## 2022-05-26 NOTE — ED Provider Notes (Signed)
[facility]    CSN: [id number] Arrival date & time: 05/26/22  1614      History   Chief Complaint Chief Complaint  Patient presents with   Vaginal Discharge    HPI Cheryl Fitzgerald is a 26 y.o. female.   Patient presents to urgent care for evaluation of "slimy and clear" vaginal discharge and dyspareunia that started a few days ago. Sexually active with same 2 female partners, one with condoms and one without. Experienced light menstrual spotting 1.5 weeks ago for 3-4 days, unknown pregnancy status. Last depo injection was in May 2023, has not used contraception since. Complains of a lot of discomfort "in the beginning of intercourse" that improves throughout intercourse. Does not use extra lubrication during intercourse. No recent new sexual partners, known exposure to STD, vaginal itching, vaginal odor, or vaginal rash. Would also like to be re-checked for UTI since she was seen last month and was found to have multiple types of bacteria in the urine with urine culture. It was recommended she come back in for re-check should symptoms persist. Complains of urinary frequency. Denies dysuria, abdominal pain, flank pain, nausea, vomiting, diarrhea, constipation, and fever/chills. Has not attempted use of over the counter medicines for symptoms.    Vaginal Discharge   Past Medical History:  Diagnosis Date   Adjustment disorder with mixed emotional features 03/23/2015   Anxiety    Depression    no medication   Shortness of breath dyspnea     Patient Active Problem List   Diagnosis Date Noted   Toe pain, bilateral 12/27/2020   Chronic diarrhea 10/31/2020   Generalized anxiety disorder 10/31/2020   History of cholecystectomy 10/31/2020   Axillary hidradenitis suppurativa 11/29/2016   Cellulitis of left arm    Acquired hyperbilirubinemia    Choledocholithiasis    Non-specific filling defect of common bile duct    Calculous cholecystitis with obstruction 08/08/2015    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   Post-dates pregnancy 04/05/2015   Adjustment disorder with mixed emotional features 03/23/2015   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   Inadequate housing 03/22/2015   Food hunger 03/22/2015    Past Surgical History:  Procedure Laterality Date   CHOLECYSTECTOMY N/A 08/08/2015   Procedure: LAPAROSCOPIC CHOLECYSTECTOMY WITH INTRAOPERATIVE CHOLANGIOGRAM;  Surgeon: Leafy Ro, MD;  Location: [facility];  Service: General;  Laterality: N/A;   ENDOSCOPIC RETROGRADE CHOLANGIOPANCREATOGRAPHY (ERCP) WITH PROPOFOL N/A 08/09/2015   Procedure: ENDOSCOPIC RETROGRADE CHOLANGIOPANCREATOGRAPHY (ERCP) WITH PROPOFOL;  Surgeon: Midge Minium, MD;  Location: [facility];  Service: Endoscopy;  Laterality: N/A;   NO PAST SURGERIES     VAGINAL DELIVERY  04/07/2015    OB History     Gravida  2   Para  1   Term  1   Preterm      AB  0   Living  1      SAB  0   IAB      Ectopic      Multiple  0   Live Births  1            Home Medications    Prior to Admission medications   Medication Sig Start Date End Date Taking? Authorizing Provider  Acetaminophen (MIDOL PO) Take 1-2 tablets by mouth 2 (two) times daily as needed (pain).    [provider]  dicyclomine (BENTYL) 20 MG tablet Take 1 tablet (20 mg total) by mouth every 8 (eight) hours as needed for spasms. 02/22/21  Petrucelli, Samantha R, PA-C  hydrOXYzine (ATARAX) 25 MG tablet Take 1 tablet (25 mg total) by mouth every 6 (six) hours. 02/17/22   Redwine, Madison A, PA-C  metroNIDAZOLE (FLAGYL) 500 MG tablet Take 1 tablet (500 mg total) by mouth 2 (two) times daily. 05/27/22   Merrilee Jansky, MD  Multiple Vitamin (MULTIVITAMIN) tablet Take 1 tablet by mouth daily.    [provider]    Family History Family History  Problem Relation Age of Onset   Diabetes Mother    Hyperlipidemia Mother    Hypertension Mother    Arthritis Father    Depression  Father    Breast cancer Maternal Grandmother    Diabetes Maternal Grandmother     Social History Social History   Tobacco Use   Smoking status: Former    Packs/day: 0.10    Types: Cigarettes   Smokeless tobacco: Never  Vaping Use   Vaping Use: Former  Substance Use Topics   Alcohol use: Not Currently   Drug use: Not Currently    Types: Marijuana     Allergies   Gramineae pollens and Peanut oil   Review of Systems Review of Systems  Genitourinary:  Positive for vaginal discharge.  Per HPI   Physical Exam Triage Vital Signs ED Triage Vitals  Enc Vitals Group     BP 05/26/22 1651 139/85     Pulse Rate 05/26/22 1651 72     Resp 05/26/22 1651 18     Temp 05/26/22 1651 98.3 F (36.8 C)     Temp Source 05/26/22 1651 Oral     SpO2 05/26/22 1651 99 %     Weight --      Height --      Head Circumference --      Peak Flow --      Pain Score 05/26/22 1649 0     Pain Loc --      Pain Edu? --      Excl. in [place]? --    No data found.  Updated Vital Signs BP 139/85 (BP Location: Left Arm)   Pulse 72   Temp 98.3 F (36.8 C) (Oral)   Resp 18   LMP 05/19/2022 (Approximate) Comment: light bleeding  SpO2 99%   Visual Acuity Right Eye Distance:   Left Eye Distance:   Bilateral Distance:    Right Eye Near:   Left Eye Near:    Bilateral Near:     Physical Exam Vitals and nursing note reviewed.  Constitutional:      Appearance: She is not ill-appearing or toxic-appearing.  HENT:     Head: Normocephalic and atraumatic.     Right Ear: Hearing and external ear normal.     Left Ear: Hearing and external ear normal.     Nose: Nose normal.     Mouth/Throat:     Lips: Pink.     Mouth: Mucous membranes are moist.     Pharynx: No posterior oropharyngeal erythema.  Eyes:     General: Lids are normal. Vision grossly intact. Gaze aligned appropriately.     Extraocular Movements: Extraocular movements intact.     Conjunctiva/sclera: Conjunctivae normal.   Cardiovascular:     Rate and Rhythm: Normal rate and regular rhythm.     Heart sounds: Normal heart sounds, S1 normal and S2 normal.  Pulmonary:     Effort: Pulmonary effort is normal. No respiratory distress.     Breath sounds: Normal breath sounds and air entry.  Genitourinary:    Comments: Deferred. Musculoskeletal:     Cervical back: Neck supple.  Skin:    General: Skin is warm and dry.     Capillary Refill: Capillary refill takes less than 2 seconds.     Findings: No rash.  Neurological:     General: No focal deficit present.     Mental Status: She is alert and oriented to person, place, and time. Mental status is at baseline.     Cranial Nerves: No dysarthria or facial asymmetry.  Psychiatric:        Mood and Affect: Mood normal.        Speech: Speech normal.        Behavior: Behavior normal.        Thought Content: Thought content normal.        Judgment: Judgment normal.      UC Treatments / Results  Labs (all labs ordered are listed, but only abnormal results are displayed)   EKG   Radiology No results found.  Procedures Procedures (including critical care time)  Medications Ordered in UC Medications - No data to display  Initial Impression / Assessment and Plan / UC Course  I have reviewed the triage vital signs and the nursing notes.  Pertinent labs & imaging results that were available during my care of the patient were reviewed by me and considered in my medical decision making (see chart for details).   1. Vaginal discharge, urinary frequency, dyspareunia  Urinalysis unremarkable for signs of UTI. Urine pregnancy is negative.   STI labs pending.  Patient declines HIV and syphilis testing today.  Will notify patient of positive results and treat accordingly when labs come back.  Patient to avoid sexual intercourse until screening testing comes back.  Education provided regarding safe sexual practices and patient encouraged to use protection to  prevent spread of STIs.   Recommend use of water based lubricant during intercourse to prevent discomfort initially.  Discussed physical exam and available lab work findings in clinic with patient.  Counseled patient regarding appropriate use of medications and potential side effects for all medications recommended or prescribed today. Discussed red flag signs and symptoms of worsening condition,when to call the PCP office, return to urgent care, and when to seek higher level of care in the emergency department. Patient verbalizes understanding and agreement with plan. All questions answered. Patient discharged in stable condition.    Final Clinical Impressions(s) / UC Diagnoses   Final diagnoses:  Vaginal discharge  Urinary frequency     Discharge Instructions      Your STD testing has been sent to the lab and will come back in the next 2 to 3 days.  We will call you if any of your results are positive requiring treatment and treat you at that time.   Avoid sexual intercourse until your STD results come back.  If any of your STD results are positive, you will need to avoid sexual intercourse for 7 days while you are being treated to prevent spread of STD.  Condom use is the best way to prevent spread of STDs.  Return to urgent care as needed.      ED Prescriptions   None    PDMP not reviewed this encounter.   Reita May [place], [place] 05/29/22 [phone]

## 2022-05-26 NOTE — ED Triage Notes (Signed)
Pt states that she is having some vaginal discharge and some pain and hurting associated with sex. She does have two sexual partners.

## 2022-05-26 NOTE — Discharge Instructions (Signed)
Your STD testing has been sent to the lab and will come back in the next 2 to 3 days.  We will call you if any of your results are positive requiring treatment and treat you at that time.   Avoid sexual intercourse until your STD results come back.  If any of your STD results are positive, you will need to avoid sexual intercourse for 7 days while you are being treated to prevent spread of STD.  Condom use is the best way to prevent spread of STDs.  Return to urgent care as needed.  

## 2022-05-27 ENCOUNTER — Telehealth (HOSPITAL_COMMUNITY): Payer: Self-pay | Admitting: Emergency Medicine

## 2022-05-27 LAB — CERVICOVAGINAL ANCILLARY ONLY
Bacterial Vaginitis (gardnerella): POSITIVE — AB
Candida Glabrata: NEGATIVE
Candida Vaginitis: NEGATIVE
Chlamydia: NEGATIVE
Comment: NEGATIVE
Comment: NEGATIVE
Comment: NEGATIVE
Comment: NEGATIVE
Comment: NEGATIVE
Comment: NORMAL
Neisseria Gonorrhea: NEGATIVE
Trichomonas: NEGATIVE

## 2022-05-27 LAB — POC URINE PREG, ED: Preg Test, Ur: NEGATIVE

## 2022-05-27 MED ORDER — METRONIDAZOLE 500 MG PO TABS: 500.0000 mg | ORAL_TABLET | Freq: Two times a day (BID) | ORAL | 0 refills | Status: DC

## 2022-07-26 ENCOUNTER — Other Ambulatory Visit: Payer: Self-pay

## 2022-07-26 ENCOUNTER — Emergency Department (HOSPITAL_COMMUNITY)
Admission: EM | Admit: 2022-07-26 | Discharge: 2022-07-26 | Disposition: A | Discharge status: Home / Self Care | Payer: 59 | Attending: Emergency Medicine | Admitting: Emergency Medicine

## 2022-07-26 ENCOUNTER — Encounter (HOSPITAL_COMMUNITY): Payer: Self-pay | Admitting: Emergency Medicine

## 2022-07-26 DIAGNOSIS — R42 Dizziness and giddiness: Secondary | ICD-10-CM | POA: Insufficient documentation

## 2022-07-26 DIAGNOSIS — N939 Abnormal uterine and vaginal bleeding, unspecified: Secondary | ICD-10-CM | POA: Insufficient documentation

## 2022-07-26 DIAGNOSIS — R1030 Lower abdominal pain, unspecified: Secondary | ICD-10-CM | POA: Diagnosis not present

## 2022-07-26 DIAGNOSIS — Z91010 Allergy to peanuts: Secondary | ICD-10-CM | POA: Insufficient documentation

## 2022-07-26 LAB — BASIC METABOLIC PANEL
Anion gap: 9 (ref 5–15)
BUN: 8 mg/dL (ref 6–20)
CO2: 24 mmol/L (ref 22–32)
Calcium: 9 mg/dL (ref 8.9–10.3)
Chloride: 105 mmol/L (ref 98–111)
Creatinine, Ser: 0.58 mg/dL (ref 0.44–1.00)
GFR, Estimated: >60 mL/min (ref >60)
Glucose, Bld: 80 mg/dL (ref 70–99)
Potassium: 3.9 mmol/L (ref 3.5–5.1)
Sodium: 138 mmol/L (ref 135–145)

## 2022-07-26 LAB — CBC WITH DIFFERENTIAL/PLATELET
Abs Immature Granulocytes: 0.02 10*3/uL (ref 0.00–0.07)
Basophils Absolute: 0 10*3/uL (ref 0.0–0.1)
Basophils Relative: 0 %
Eosinophils Absolute: 0.2 10*3/uL (ref 0.0–0.5)
Eosinophils Relative: 2 %
HCT: 41.8 % (ref 36.0–46.0)
Hemoglobin: 13.7 g/dL (ref 12.0–15.0)
Immature Granulocytes: 0 %
Lymphocytes Relative: 26 %
Lymphs Abs: 2.4 10*3/uL (ref 0.7–4.0)
MCH: 28.5 pg (ref 26.0–34.0)
MCHC: 32.8 g/dL (ref 30.0–36.0)
MCV: 86.9 fL (ref 80.0–100.0)
Monocytes Absolute: 0.6 10*3/uL (ref 0.1–1.0)
Monocytes Relative: 7 %
Neutro Abs: 5.8 10*3/uL (ref 1.7–7.7)
Neutrophils Relative %: 65 %
Platelets: 300 10*3/uL (ref 150–400)
RBC: 4.81 MIL/uL (ref 3.87–5.11)
RDW: 12.6 % (ref 11.5–15.5)
WBC: 8.9 10*3/uL (ref 4.0–10.5)
nRBC: 0 % (ref 0.0–0.2)

## 2022-07-26 LAB — I-STAT BETA HCG BLOOD, ED (MC, WL, AP ONLY): I-stat hCG, quantitative: <5 m[IU]/mL (ref <5)

## 2022-07-26 LAB — WET PREP, GENITAL
Clue Cells Wet Prep HPF POC: NONE SEEN
Sperm: NONE SEEN
Trich, Wet Prep: NONE SEEN
WBC, Wet Prep HPF POC: <10 (ref <10)
Yeast Wet Prep HPF POC: NONE SEEN

## 2022-07-26 MED ORDER — SODIUM CHLORIDE 0.9 % IV BOLUS
1000.0000 mL | Freq: Once | INTRAVENOUS | Status: AC
  Administered 2022-07-26: 1000 mL via INTRAVENOUS

## 2022-07-26 MED ORDER — KETOROLAC TROMETHAMINE 30 MG/ML IJ SOLN
30.0000 mg | Freq: Once | INTRAMUSCULAR | Status: AC
  Administered 2022-07-26: 30 mg via INTRAVENOUS
  Filled 2022-07-26: qty 1

## 2022-07-26 NOTE — ED Notes (Signed)
Patient was unable to give a urine sample, Small amount of blood found in toilet bowel.

## 2022-07-26 NOTE — ED Notes (Signed)
Patient is refusing to do a pelvic until she is in a bed with stirrups.

## 2022-07-26 NOTE — ED Provider Notes (Signed)
[place] [facility]MENT AT [facility] Provider Note   CSN: [id number] Arrival date & time: 07/26/22  1415     History  Chief Complaint  Patient presents with   Vaginal Bleeding    Cheryl Fitzgerald is a 27 y.o. female.  Patient presents emergency department for evaluation of vaginal bleeding and lower abdominal discomfort starting yesterday.  Patient states that she received Depo shot last May and since that time has had very light irregular periods.  She had bleeding in December and January which was very light.  Yesterday she developed heavy bleeding and has been changing out pads very frequently.  She felt very lightheaded today, prompting ED visit.  No full syncope.  No chest pain or shortness of breath.  She is not sure if she could be pregnant or could be having a miscarriage.  No history of bleeding problems.       Home Medications Prior to Admission medications   Medication Sig Start Date End Date Taking? Authorizing Provider  Acetaminophen (MIDOL PO) Take 1-2 tablets by mouth 2 (two) times daily as needed (pain).    [provider]  dicyclomine (BENTYL) 20 MG tablet Take 1 tablet (20 mg total) by mouth every 8 (eight) hours as needed for spasms. 02/22/21   Petrucelli, Samantha R, PA-C  hydrOXYzine (ATARAX) 25 MG tablet Take 1 tablet (25 mg total) by mouth every 6 (six) hours. 02/17/22   Redwine, Madison A, PA-C  metroNIDAZOLE (FLAGYL) 500 MG tablet Take 1 tablet (500 mg total) by mouth 2 (two) times daily. 05/27/22   Chase Picket, MD  Multiple Vitamin (MULTIVITAMIN) tablet Take 1 tablet by mouth daily.    [provider]      Allergies    Gramineae pollens and Peanut oil    Review of Systems   Review of Systems  Physical Exam Updated Vital Signs BP (!) 149/95   Pulse 75   Temp 98 F (36.7 C) (Oral)   Resp 20   SpO2 100%   Physical Exam Vitals and nursing note reviewed. Exam conducted with a chaperone present.   Constitutional:      General: She is not in acute distress.    Appearance: She is well-developed.  HENT:     Head: Normocephalic and atraumatic.     Right Ear: External ear normal.     Left Ear: External ear normal.     Nose: Nose normal.  Eyes:     Conjunctiva/sclera: Conjunctivae normal.  Cardiovascular:     Rate and Rhythm: Normal rate and regular rhythm.     Heart sounds: No murmur heard. Pulmonary:     Effort: No respiratory distress.     Breath sounds: No wheezing, rhonchi or rales.  Abdominal:     Palpations: Abdomen is soft.     Tenderness: There is no abdominal tenderness. There is no guarding or rebound.  Genitourinary:    Exam position: Lithotomy position.     Labia:        Right: No lesion.        Left: No lesion.      Vagina: Bleeding present.     Cervix: No cervical motion tenderness.     Uterus: Tender.      Adnexa:        Right: No mass or tenderness.         Left: No mass or tenderness.       Comments: Mild to moderate dark blood noted in  vaginal vault.  No clotting. Musculoskeletal:     Cervical back: Normal range of motion and neck supple.     Right lower leg: No edema.     Left lower leg: No edema.  Skin:    General: Skin is warm and dry.     Findings: No rash.  Neurological:     General: No focal deficit present.     Mental Status: She is alert. Mental status is at baseline.     Motor: No weakness.  Psychiatric:        Mood and Affect: Mood normal.     ED Results / Procedures / Treatments   Labs (all labs ordered are listed, but only abnormal results are displayed) Labs Reviewed  WET PREP, GENITAL  CBC WITH DIFFERENTIAL/PLATELET  BASIC METABOLIC PANEL  I-STAT BETA HCG BLOOD, ED ([facility], [facility], [facility] ONLY)  GC/CHLAMYDIA PROBE AMP ([place]) NOT AT [facility]    EKG None  Radiology No results found.  Procedures Procedures    Medications Ordered in ED Medications  sodium chloride 0.9 % bolus 1,000 mL (1,000 mLs Intravenous New Bag/Given  07/26/22 1711)  ketorolac (TORADOL) 30 MG/ML injection 30 mg (30 mg Intravenous Given 07/26/22 1711)    ED Course/ Medical Decision Making/ A&P    Patient seen and examined. History obtained directly from patient.   Labs/EKG: Ordered CBC, BMP, pregnancy test, wet prep and GC/chlamydia  Imaging: None ordered  Medications/Fluids: None ordered  Most recent vital signs reviewed and are as follows: BP (!) 149/95   Pulse 75   Temp 98 F (36.7 C) (Oral)   Resp 20   SpO2 100%   Initial impression: Vaginal bleeding  4:37 PM Reassessment performed. We initially started to do pelvic exam using bedpan and patient did not ultimately feel comfortable, so bed was switched to one with stirrups and patient tolerated well. Performed with RN chaperone.   Labs personally reviewed and interpreted including: CBC with differential, normal hemoglobin otherwise unremarkable; BMP normal; pregnancy negative.  Wet prep pending.  Reviewed pertinent lab work and imaging with patient at bedside. Questions answered.   Most current vital signs reviewed and are as follows: BP 138/84   Pulse 68   Temp 98 F (36.7 C) (Oral)   Resp 20   Ht 5' 6"$  (1.676 m)   Wt 108 kg   SpO2 98%   BMI 38.43 kg/m   Plan: Will treat symptomatically with IV fluid bolus, IV Toradol.  5:57 PM Reassessment performed. Patient appears stable.  Labs personally reviewed and interpreted including: Wet prep negative.  Reviewed pertinent lab work and imaging with patient at bedside. Questions answered.   Most current vital signs reviewed and are as follows: BP 118/72   Pulse 81   Temp 98 F (36.7 C) (Oral)   Resp 18   Ht 5' 6"$  (1.676 m)   Wt 108 kg   SpO2 99%   BMI 38.43 kg/m   Plan: Discharge to home.   Prescriptions written for: None  Other home care instructions discussed: Rest, OTC meds for pain and discomfort  ED return instructions discussed: Return with heavier bleeding, syncope, chest pain or shortness of  breath, new symptoms or other concerns.  Follow-up instructions discussed: Patient encouraged to follow-up with OB/GYN referral in 1 week.                            Medical Decision Making Amount and/or Complexity of  Data Reviewed Labs: ordered.  Risk Prescription drug management.   Patient with vaginal bleeding.  Mild to moderate bleeding on exam.  Hemoglobin is normal.  Patient is not pregnant.  Her periods have been light and irregular, likely due to preceding Depo shot, however now having heavier, likely period bleeding.  No indications for emergent OB/GYN evaluation.  No indications for ultrasound.  Follow-up referral given.  The patient's vital signs, pertinent lab work and imaging were reviewed and interpreted as discussed in the ED course. Hospitalization was considered for further testing, treatments, or serial exams/observation. However as patient is well-appearing, has a stable exam, and reassuring studies today, I do not feel that they warrant admission at this time. This plan was discussed with the patient who verbalizes agreement and comfort with this plan and seems reliable and able to return to the Emergency Department with worsening or changing symptoms.          Final Clinical Impression(s) / ED Diagnoses Final diagnoses:  Vaginal bleeding    Rx / DC Orders ED Discharge Orders     None         Carlisle Cater, Hershal Coria 07/26/22 Marni Griffon, MD 07/27/22 [phone]

## 2022-07-26 NOTE — Discharge Instructions (Signed)
Please read and follow all provided instructions.  Your diagnoses today include:  1. Vaginal bleeding    Tests performed today include: Blood cell counts and electrolytes: Your blood cell count was normal Pregnancy test: Was negative Electrolytes kidney function: Were normal Vital signs. See below for your results today.   Medications prescribed:  Please use over-the-counter NSAID medications (ibuprofen, naproxen) or Tylenol (acetaminophen) as directed on the packaging for pain -- as long as you do not have any reasons avoid these medications. Reasons to avoid NSAID medications include: weak kidneys, a history of bleeding in your stomach or gut, or uncontrolled high blood pressure or previous heart attack. Reasons to avoid Tylenol include: liver problems or ongoing alcohol use. Never take more than 407m or 8 Extra strength Tylenol in a 24 hour period.     Take any prescribed medications only as directed.  Home care instructions:  Follow any educational materials contained in this packet.  BE VERY CAREFUL not to take multiple medicines containing Tylenol (also called acetaminophen). Doing so can lead to an overdose which can damage your liver and cause liver failure and possibly death.   Follow-up instructions: Please follow-up with the GYN referral listed for further evaluation of your symptoms.   Return instructions:  Please return to the Emergency Department if you experience worsening symptoms.  Return with worsening or heavier bleeding, if you develop shortness of breath, pass out Please return if you have any other emergent concerns.  Additional Information:  Your vital signs today were: BP 138/84   Pulse 68   Temp 98 F (36.7 C) (Oral)   Resp 20   Ht 5' 6"$  (1.676 m)   Wt 108 kg   SpO2 98%   BMI 38.43 kg/m  If your blood pressure (BP) was elevated above 135/85 this visit, please have this repeated by your doctor within one month. --------------

## 2022-07-26 NOTE — ED Triage Notes (Signed)
Patient c/o heavy vaginal bleeding onset of 2 pm yesterday. Patient reports wearing 5 pads right now. States she is light headed. Patient talking very fast and very anxious in triage. Missed her depo shot. Not on any birth control.

## 2022-07-27 LAB — GC/CHLAMYDIA PROBE AMP (~~LOC~~) NOT AT ARMC
Chlamydia: NEGATIVE
Comment: NEGATIVE
Comment: NORMAL
Neisseria Gonorrhea: NEGATIVE

## 2022-08-07 ENCOUNTER — Ambulatory Visit
Admission: EM | Admit: 2022-08-07 | Discharge: 2022-08-07 | Disposition: A | Discharge status: Home / Self Care | Payer: 59 | Attending: Family Medicine | Admitting: Family Medicine

## 2022-08-07 DIAGNOSIS — R102 Pelvic and perineal pain: Secondary | ICD-10-CM | POA: Diagnosis not present

## 2022-08-07 LAB — POCT URINALYSIS DIP (MANUAL ENTRY)
Bilirubin, UA: NEGATIVE
Blood, UA: NEGATIVE
Glucose, UA: NEGATIVE mg/dL
Ketones, POC UA: NEGATIVE mg/dL
Nitrite, UA: NEGATIVE
Protein Ur, POC: NEGATIVE mg/dL
Spec Grav, UA: 1.02 (ref 1.010–1.025)
Urobilinogen, UA: 0.2 E.U./dL
pH, UA: 7.5 (ref 5.0–8.0)

## 2022-08-07 LAB — POCT URINE PREGNANCY: Preg Test, Ur: NEGATIVE

## 2022-08-07 MED ORDER — KETOROLAC TROMETHAMINE 30 MG/ML IJ SOLN: 30.0000 mg | Freq: Once | INTRAMUSCULAR | Status: DC

## 2022-08-07 MED ORDER — ONDANSETRON 4 MG PO TBDP: 4.0000 mg | ORAL_TABLET | Freq: Three times a day (TID) | ORAL | 0 refills | Status: AC | PRN

## 2022-08-07 MED ORDER — KETOROLAC TROMETHAMINE 15 MG/ML IJ SOLN
30.0000 mg | Freq: Once | INTRAMUSCULAR | Status: AC
  Administered 2022-08-07: 30 mg via INTRAMUSCULAR

## 2022-08-07 MED ORDER — IBUPROFEN 800 MG PO TABS: 800.0000 mg | ORAL_TABLET | Freq: Three times a day (TID) | ORAL | 0 refills | Status: AC | PRN

## 2022-08-07 NOTE — ED Provider Notes (Addendum)
[facility]    CSN: [id number] Arrival date & time: 08/07/22  1923      History   Chief Complaint Chief Complaint  Patient presents with   Abdominal Pain    HPI Cheryl Fitzgerald is a 27 y.o. female.    Abdominal Pain  Here for abdominal pain that began 2 days ago.  It is most prominent in her suprapubic area across her lower abdomen, but she also feels that some in her upper abdomen.  No nausea or vomiting.  Last normal bowel movement was this morning.  No blood in the stool.  No fever or chills.  No dysuria  She was recently seen in the emergency room for some vaginal bleeding.  That did stop a few days ago.  Testing at that time was negative for pregnancy and for infection.  Past Medical History:  Diagnosis Date   Adjustment disorder with mixed emotional features 03/23/2015   Anxiety    Depression    no medication   Shortness of breath dyspnea     Patient Active Problem List   Diagnosis Date Noted   Toe pain, bilateral 12/27/2020   Chronic diarrhea 10/31/2020   Generalized anxiety disorder 10/31/2020   History of cholecystectomy 10/31/2020   Axillary hidradenitis suppurativa 11/29/2016   Cellulitis of left arm    Acquired hyperbilirubinemia    Choledocholithiasis    Non-specific filling defect of common bile duct    Calculous cholecystitis with obstruction 08/08/2015   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   Post-dates pregnancy 04/05/2015   Adjustment disorder with mixed emotional features 03/23/2015   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   Inadequate housing 03/22/2015   Food hunger 03/22/2015    Past Surgical History:  Procedure Laterality Date   CHOLECYSTECTOMY N/A 08/08/2015   Procedure: LAPAROSCOPIC CHOLECYSTECTOMY WITH INTRAOPERATIVE CHOLANGIOGRAM;  Surgeon: Jules Husbands, MD;  Location: [facility] ORS;  Service: General;  Laterality: N/A;   ENDOSCOPIC RETROGRADE CHOLANGIOPANCREATOGRAPHY (ERCP) WITH PROPOFOL N/A 08/09/2015    Procedure: ENDOSCOPIC RETROGRADE CHOLANGIOPANCREATOGRAPHY (ERCP) WITH PROPOFOL;  Surgeon: Lucilla Lame, MD;  Location: [facility] ENDOSCOPY;  Service: Endoscopy;  Laterality: N/A;   NO PAST SURGERIES     VAGINAL DELIVERY  04/07/2015    OB History     Gravida  2   Para  1   Term  1   Preterm      AB  0   Living  1      SAB  0   IAB      Ectopic      Multiple  0   Live Births  1            Home Medications    Prior to Admission medications   Medication Sig Start Date End Date Taking? Authorizing Provider  ibuprofen (ADVIL) 800 MG tablet Take 1 tablet (800 mg total) by mouth every 8 (eight) hours as needed (pain). 08/07/22  Yes Barrett Henle, MD  ondansetron (ZOFRAN-ODT) 4 MG disintegrating tablet Take 1 tablet (4 mg total) by mouth every 8 (eight) hours as needed for nausea or vomiting. 08/07/22  Yes Jevon Shells, Gwenlyn Perking, MD  dicyclomine (BENTYL) 20 MG tablet Take 1 tablet (20 mg total) by mouth every 8 (eight) hours as needed for spasms. 02/22/21   Petrucelli, Samantha R, PA-C  hydrOXYzine (ATARAX) 25 MG tablet Take 1 tablet (25 mg total) by mouth every 6 (six) hours. 02/17/22   Redwine, Madison A, PA-C  Multiple Vitamin (MULTIVITAMIN) tablet Take 1 tablet  by mouth daily.    [provider]    Family History Family History  Problem Relation Age of Onset   Diabetes Mother    Hyperlipidemia Mother    Hypertension Mother    Arthritis Father    Depression Father    Breast cancer Maternal Grandmother    Diabetes Maternal Grandmother     Social History Social History   Tobacco Use   Smoking status: Former    Packs/day: 0.10    Types: Cigarettes   Smokeless tobacco: Never  Vaping Use   Vaping Use: Former  Substance Use Topics   Alcohol use: Not Currently   Drug use: Not Currently    Types: Marijuana     Allergies   Gramineae pollens and Peanut oil   Review of Systems Review of Systems  Gastrointestinal:  Positive for abdominal pain.      Physical Exam Triage Vital Signs ED Triage Vitals  Enc Vitals Group     BP 08/07/22 2000 139/82     Pulse Rate 08/07/22 2000 97     Resp 08/07/22 2000 20     Temp 08/07/22 2000 98.1 F (36.7 C)     Temp Source 08/07/22 2000 Oral     SpO2 08/07/22 2000 99 %     Weight --      Height --      Head Circumference --      Peak Flow --      Pain Score 08/07/22 2001 7     Pain Loc --      Pain Edu? --      Excl. in [place]? --    No data found.  Updated Vital Signs BP 139/82 (BP Location: Left Arm)   Pulse 97   Temp 98.1 F (36.7 C) (Oral)   Resp 20   LMP 08/02/2022 (Approximate)   SpO2 99%   Visual Acuity Right Eye Distance:   Left Eye Distance:   Bilateral Distance:    Right Eye Near:   Left Eye Near:    Bilateral Near:     Physical Exam Vitals reviewed.  Constitutional:      General: She is not in acute distress.    Appearance: She is not ill-appearing, toxic-appearing or diaphoretic.  HENT:     Mouth/Throat:     Mouth: Mucous membranes are moist.     Pharynx: No oropharyngeal exudate or posterior oropharyngeal erythema.  Eyes:     Extraocular Movements: Extraocular movements intact.     Conjunctiva/sclera: Conjunctivae normal.     Pupils: Pupils are equal, round, and reactive to light.  Cardiovascular:     Rate and Rhythm: Normal rate and regular rhythm.     Heart sounds: No murmur heard. Pulmonary:     Effort: Pulmonary effort is normal.     Breath sounds: Normal breath sounds.  Abdominal:     General: There is no distension.     Palpations: Abdomen is soft. There is no mass.     Comments: There is tenderness over the entire abdomen, but it is more prominent in the suprapubic area.  No mass  Musculoskeletal:     Cervical back: Neck supple.  Lymphadenopathy:     Cervical: No cervical adenopathy.  Skin:    Coloration: Skin is not jaundiced or pale.  Neurological:     General: No focal deficit present.     Mental Status: She is alert and oriented to  person, place, and time.  Psychiatric:  Behavior: Behavior normal.      UC Treatments / Results  Labs (all labs ordered are listed, but only abnormal results are displayed) Labs Reviewed  POCT URINALYSIS DIP (MANUAL ENTRY) - Abnormal; Notable for the following components:      Result Value   Leukocytes, UA Trace (*)    All other components within normal limits  URINE CULTURE  POCT URINE PREGNANCY  CERVICOVAGINAL ANCILLARY ONLY    EKG   Radiology No results found.  Procedures Procedures (including critical care time)  Medications Ordered in UC Medications  ketorolac (TORADOL) 15 MG/ML injection 30 mg (30 mg Intramuscular Given 08/07/22 2021)    Initial Impression / Assessment and Plan / UC Course  I have reviewed the triage vital signs and the nursing notes.  Pertinent labs & imaging results that were available during my care of the patient were reviewed by me and considered in my medical decision making (see chart for details).        UPT is negative There is trace of leukocytes on the UA dip.  I discussed with the patient considering going to the emergency room for advanced imaging, but she is adamant that she cannot do that.  Also she does not adverse understand the need for the vaginal swab as she states she is hurting in her whole abdomen.  It does sound like the main pain is lower.  Self swab is done and staff will notify her if anything is positive.  Also urine culture is sent and we will notify her if she needs treatment for UTI.  I have asked her to proceed to the emergency room if she worsens in any way or if she is not keeping fluids down.  Toradol was given here tonight and ibuprofen is sent in for her to take as needed.  When staff was giving her the Toradol, she noted nausea.  Zofran is sent in for that. Final Clinical Impressions(s) / UC Diagnoses   Final diagnoses:  Pelvic pain     Discharge Instructions      Pregnancy test was  negative  urinalysis did have little bit of white blood cells; culture is sent and staff will notify you if it looks like there is a urine infection  You have been given a shot of Toradol 30 mg today.  Take ibuprofen 800 mg--1 tab every 8 hours as needed for pain.  If you worsen anyway please proceed to the emergency room for further evaluation    ED Prescriptions     Medication Sig Dispense Auth. Provider   ibuprofen (ADVIL) 800 MG tablet Take 1 tablet (800 mg total) by mouth every 8 (eight) hours as needed (pain). 21 tablet Pattijo Juste, Gwenlyn Perking, MD   ondansetron (ZOFRAN-ODT) 4 MG disintegrating tablet Take 1 tablet (4 mg total) by mouth every 8 (eight) hours as needed for nausea or vomiting. 10 tablet Windy Carina Gwenlyn Perking, MD      I have reviewed the PDMP during this encounter.   Barrett Henle, MD 08/07/22 2021    Barrett Henle, MD 08/07/22 [phone]

## 2022-08-07 NOTE — Discharge Instructions (Signed)
Pregnancy test was negative  urinalysis did have little bit of white blood cells; culture is sent and staff will notify you if it looks like there is a urine infection  You have been given a shot of Toradol 30 mg today.  Take ibuprofen 800 mg--1 tab every 8 hours as needed for pain.  If you worsen anyway please proceed to the emergency room for further evaluation

## 2022-08-07 NOTE — ED Triage Notes (Addendum)
Patient with c/o lower abdominal pain radiating into her vagina. Patient states she was recently at the ED for "severe vaginal bleeding". Denies any vaginal bleeding at this time.

## 2022-08-09 LAB — URINE CULTURE: Culture: NO GROWTH

## 2022-08-10 LAB — CERVICOVAGINAL ANCILLARY ONLY
Bacterial Vaginitis (gardnerella): NEGATIVE
Candida Glabrata: NEGATIVE
Candida Vaginitis: NEGATIVE
Chlamydia: NEGATIVE
Comment: NEGATIVE
Comment: NEGATIVE
Comment: NEGATIVE
Comment: NEGATIVE
Comment: NEGATIVE
Comment: NORMAL
Neisseria Gonorrhea: NEGATIVE
Trichomonas: NEGATIVE

## 2022-08-15 ENCOUNTER — Emergency Department (HOSPITAL_COMMUNITY)
Admission: EM | Admit: 2022-08-15 | Discharge: 2022-08-15 | Disposition: A | Discharge status: Home / Self Care | Payer: 59 | Attending: Emergency Medicine | Admitting: Emergency Medicine

## 2022-08-15 ENCOUNTER — Other Ambulatory Visit: Payer: Self-pay

## 2022-08-15 ENCOUNTER — Encounter (HOSPITAL_COMMUNITY): Payer: Self-pay

## 2022-08-15 ENCOUNTER — Emergency Department (HOSPITAL_COMMUNITY): Payer: 59

## 2022-08-15 DIAGNOSIS — Z91010 Allergy to peanuts: Secondary | ICD-10-CM | POA: Insufficient documentation

## 2022-08-15 DIAGNOSIS — R220 Localized swelling, mass and lump, head: Secondary | ICD-10-CM | POA: Diagnosis not present

## 2022-08-15 DIAGNOSIS — K029 Dental caries, unspecified: Secondary | ICD-10-CM | POA: Diagnosis not present

## 2022-08-15 DIAGNOSIS — K0889 Other specified disorders of teeth and supporting structures: Secondary | ICD-10-CM | POA: Diagnosis not present

## 2022-08-15 LAB — CBC
HCT: 40.8 % (ref 36.0–46.0)
Hemoglobin: 13.6 g/dL (ref 12.0–15.0)
MCH: 28.2 pg (ref 26.0–34.0)
MCHC: 33.3 g/dL (ref 30.0–36.0)
MCV: 84.6 fL (ref 80.0–100.0)
Platelets: 273 10*3/uL (ref 150–400)
RBC: 4.82 MIL/uL (ref 3.87–5.11)
RDW: 12.6 % (ref 11.5–15.5)
WBC: 7.5 10*3/uL (ref 4.0–10.5)
nRBC: 0 % (ref 0.0–0.2)

## 2022-08-15 LAB — BASIC METABOLIC PANEL
Anion gap: 6 (ref 5–15)
BUN: 9 mg/dL (ref 6–20)
CO2: 25 mmol/L (ref 22–32)
Calcium: 9 mg/dL (ref 8.9–10.3)
Chloride: 103 mmol/L (ref 98–111)
Creatinine, Ser: 0.61 mg/dL (ref 0.44–1.00)
GFR, Estimated: >60 mL/min (ref >60)
Glucose, Bld: 96 mg/dL (ref 70–99)
Potassium: 3.9 mmol/L (ref 3.5–5.1)
Sodium: 134 mmol/L — ABNORMAL LOW (ref 135–145)

## 2022-08-15 MED ORDER — IOHEXOL 300 MG/ML  SOLN
75.0000 mL | Freq: Once | INTRAMUSCULAR | Status: AC | PRN
  Administered 2022-08-15: 75 mL via INTRAVENOUS

## 2022-08-15 MED ORDER — HYDROCODONE-ACETAMINOPHEN 5-325 MG PO TABS
1.0000 | ORAL_TABLET | Freq: Once | ORAL | Status: AC
  Administered 2022-08-15: 1 via ORAL
  Filled 2022-08-15: qty 1

## 2022-08-15 MED ORDER — AMOXICILLIN-POT CLAVULANATE 875-125 MG PO TABS
1.0000 | ORAL_TABLET | Freq: Once | ORAL | Status: AC
  Administered 2022-08-15: 1 via ORAL
  Filled 2022-08-15: qty 1

## 2022-08-15 MED ORDER — HYDROCODONE-ACETAMINOPHEN 5-325 MG PO TABS: 1.0000 | ORAL_TABLET | ORAL | 0 refills | Status: AC | PRN

## 2022-08-15 MED ORDER — AMOXICILLIN-POT CLAVULANATE 875-125 MG PO TABS: 1.0000 | ORAL_TABLET | Freq: Two times a day (BID) | ORAL | 0 refills | Status: AC

## 2022-08-15 NOTE — Discharge Instructions (Addendum)
Return to ED with any new or worsening signs or symptoms Please pick up antibiotics that I have sent to the here to your pharmacy.  Please utilize good Rx prescription.  Please also pick up pain medication.  Please do not drive or operate heavy machinery under the influence of this medication. Please utilize dental resource guide to follow-up with dentist in the area for extraction of tooth I placed consult to social work.  They will call you tomorrow. Please see work note

## 2022-08-15 NOTE — ED Triage Notes (Addendum)
States that she has been dealing with a dental abscess on the lower left side for over a year, but the pain and swelling has gotten worse

## 2022-08-15 NOTE — ED Provider Notes (Signed)
[facility] Provider Note   CSN: [id number] Arrival date & time: 08/15/22  1459     History  Chief Complaint  Patient presents with   Dental Pain    Cheryl Fitzgerald is a 27 y.o. female who presents to the ED for evaluation of left lower dental pain.  The patient states that she has not consistently seen a dentist for her entire life.  Patient goes on to state that she has had a "dental abscess" on the left lower portion of her jaw for over a year.  Patient denies ever being officially diagnosed with a dental abscess, denies ever seeing dentistry and having a dentist [place] has a dental abscess.  Patient is on to state that she is just been dealing with extreme pain to the left lower portion of her gumline as well as swelling of the left lower portion of her jaw for quite some time now.  The patient is unable to give me an exact timeline regarding this issue.  The patient goes on to state that she has been seen by dentistry in the past, on chart review it appears that she is seeing a resident clinic at [facility] with Dr. Anselm Jungling however reports that she is unable to afford diagnostic examination fee as well as imaging fee.  Patient states the pain got so severe today that she had to come in for evaluation.  The patient is on to state that she is also currently homeless and living out of a hotel.  Patient denies any fevers, nausea, vomiting, drainage of pus from left lower portion of jaw, headache, neck pain.  Patient denies any trouble swallowing, sore throat.   Dental Pain Associated symptoms: facial swelling   Associated symptoms: no fever        Home Medications Prior to Admission medications   Medication Sig Start Date End Date Taking? Authorizing Provider  amoxicillin-clavulanate (AUGMENTIN) 875-125 MG tablet Take 1 tablet by mouth every 12 (twelve) hours. 08/15/22  Yes Azucena Cecil, PA-C  HYDROcodone-acetaminophen  (NORCO/VICODIN) 5-325 MG tablet Take 1 tablet by mouth every 4 (four) hours as needed. 08/15/22  Yes Azucena Cecil, PA-C  dicyclomine (BENTYL) 20 MG tablet Take 1 tablet (20 mg total) by mouth every 8 (eight) hours as needed for spasms. 02/22/21   Petrucelli, Samantha R, PA-C  hydrOXYzine (ATARAX) 25 MG tablet Take 1 tablet (25 mg total) by mouth every 6 (six) hours. 02/17/22   Redwine, Madison A, PA-C  ibuprofen (ADVIL) 800 MG tablet Take 1 tablet (800 mg total) by mouth every 8 (eight) hours as needed (pain). 08/07/22   Barrett Henle, MD  Multiple Vitamin (MULTIVITAMIN) tablet Take 1 tablet by mouth daily.    [provider]  ondansetron (ZOFRAN-ODT) 4 MG disintegrating tablet Take 1 tablet (4 mg total) by mouth every 8 (eight) hours as needed for nausea or vomiting. 08/07/22   Barrett Henle, MD      Allergies    Gramineae pollens and Peanut oil    Review of Systems   Review of Systems  Constitutional:  Negative for fever.  HENT:  Positive for dental problem and facial swelling. Negative for sore throat and trouble swallowing.   Gastrointestinal:  Negative for diarrhea, nausea and vomiting.  All other systems reviewed and are negative.   Physical Exam Updated Vital Signs BP (!) 130/90   Pulse 88   Temp 97.6 F (36.4 C)   Resp  19   Ht '5\' 6"'$  (1.676 m)   Wt 105 kg   LMP 08/02/2022 (Approximate)   SpO2 99%   BMI 37.36 kg/m  Physical Exam Vitals and nursing note reviewed.  Constitutional:      General: She is not in acute distress.    Appearance: She is well-developed.  HENT:     Head: Normocephalic and atraumatic.     Mouth/Throat:     Comments: Broken off 2nd bicuspid #20 with surrounding erythema, extensive dental caries throughout, no obvious abscess in need of draining. Patient handling secretions appropriately, uvula midline, no change in phonation Eyes:     Conjunctiva/sclera: Conjunctivae normal.  Cardiovascular:     Rate and Rhythm: Normal rate and  regular rhythm.     Heart sounds: No murmur heard. Pulmonary:     Effort: Pulmonary effort is normal. No respiratory distress.     Breath sounds: Normal breath sounds.  Abdominal:     Palpations: Abdomen is soft.     Tenderness: There is no abdominal tenderness.  Musculoskeletal:        General: No swelling.     Cervical back: Neck supple.  Skin:    General: Skin is warm and dry.     Capillary Refill: Capillary refill takes less than 2 seconds.  Neurological:     Mental Status: She is alert.  Psychiatric:        Mood and Affect: Mood normal.     ED Results / Procedures / Treatments   Labs (all labs ordered are listed, but only abnormal results are displayed) Labs Reviewed  BASIC METABOLIC PANEL - Abnormal; Notable for the following components:      Result Value   Sodium 134 (*)    All other components within normal limits  CBC    EKG None  Radiology CT Maxillofacial W Contrast  Result Date: 08/15/2022 CLINICAL DATA:  Assess for dental abscess, left lower. EXAM: CT MAXILLOFACIAL WITH CONTRAST TECHNIQUE: Multidetector CT imaging of the maxillofacial structures was performed with intravenous contrast. Multiplanar CT image reconstructions were also generated. RADIATION DOSE REDUCTION: This exam was performed according to the departmental dose-optimization program which includes automated exposure control, adjustment of the mA and/or kV according to patient size and/or use of iterative reconstruction technique. CONTRAST:  56m OMNIPAQUE IOHEXOL 300 MG/ML  SOLN COMPARISON:  None Available. FINDINGS: Osseous: Periapical lucency of the left mandibular first molar with anterior cortical breakthrough and surrounding inflammatory fat stranding in the overlying soft tissues. Additional periapical lucencies of the left mandibular third molar, right mandibular second molar, and left maxillary first molar. Extensive dental caries. Orbits: Negative. No traumatic or inflammatory finding. Sinuses:  Mild mucosal disease in the left maxillary sinus. Soft tissues: Inflammatory fat stranding along the left mandibular body without discrete fluid collection. Limited intracranial: No significant or unexpected finding. IMPRESSION: 1. Periapical lucency of the left mandibular first molar with anterior cortical breakthrough and surrounding inflammatory fat stranding in the overlying soft tissues, consistent with odontogenic infection. 2. Additional periapical lucencies of the left mandibular third molar, right mandibular second molar, and left maxillary first molar. 3. Extensive dental caries. Electronically Signed   By: WEmmit AlexandersM.D.   On: 08/15/2022 16:53    Procedures Procedures   Medications Ordered in ED Medications  HYDROcodone-acetaminophen (NORCO/VICODIN) 5-325 MG per tablet 1 tablet (1 tablet Oral Given 08/15/22 1534)  iohexol (OMNIPAQUE) 300 MG/ML solution 75 mL (75 mLs Intravenous Contrast Given 08/15/22 1642)  amoxicillin-clavulanate (AUGMENTIN) 875-125 MG per  tablet 1 tablet (1 tablet Oral Given 08/15/22 1751)    ED Course/ Medical Decision Making/ A&P  Medical Decision Making Amount and/or Complexity of Data Reviewed Labs: ordered. Radiology: ordered.  Risk Prescription drug management.   27 year old female presents to ED for evaluation.  Please see HPI for further details.  On examination the patient is afebrile and nontachycardic.  Lung sounds clear bilaterally, not hypoxic on room air.  Abdomen soft and compressible throughout.  Normal neurological examination.  The patient does have extensive dental caries throughout as well as a broken off second bicuspid molar with surrounding erythema however no obvious abscess in need of drainage, patient handling secretions appropriately, uvula midline.  Patient nontoxic in appearance with oxygen saturation of 100% on room air.  Patient CT maxillofacial with contrast shows no obvious abscess in need of draining.  Patient does have  extensive dental caries, cellulitis.  Patient will be sent home with Augmentin which she will take twice daily for the next 7 days.  Patient will also be sent home with dental resource guide.  Patient given return precautions and she voiced understanding.  Patient all her questions answered to her satisfaction.  Update: Patient prior to discharge states that she cannot afford antibiotics.  I have consulted social work however social work is not available at this time.  Patient provided first dose of antibiotic here.  Patient provided pain medication.  Patient requesting pain medication on discharge, states that if she does not receive pain medication she will "go home and overdose on Tylenol and ibuprofen".  Patient denies SI to states that the pain will be so bad she will just keep taking ibuprofen.  The patient will be sent home with 5 hydrocodone pills.  I have sent these medications to the [facility] and I have provided the patient with good Rx prescription coupons.  Patient provided dental resource guide.  Patient discharged stable condition.   Final Clinical Impression(s) / ED Diagnoses Final diagnoses:  Pain due to dental caries    Rx / DC Orders ED Discharge Orders          Ordered    HYDROcodone-acetaminophen (NORCO/VICODIN) 5-325 MG tablet  Every 4 hours PRN        08/15/22 1858    amoxicillin-clavulanate (AUGMENTIN) 875-125 MG tablet  Every 12 hours        08/15/22 1858              Azucena Cecil, PA-C 08/15/22 1858    Lorelle Gibbs, DO 08/15/22 2330

## 2022-08-16 NOTE — Care Management (Signed)
Consult regarding affording medication. Patient has insurance and therefore is ineligible for [facility] medication assistance.

## 2022-08-25 ENCOUNTER — Other Ambulatory Visit (HOSPITAL_BASED_OUTPATIENT_CLINIC_OR_DEPARTMENT_OTHER): Payer: Self-pay

## 2022-08-25 ENCOUNTER — Emergency Department (HOSPITAL_BASED_OUTPATIENT_CLINIC_OR_DEPARTMENT_OTHER)
Admission: EM | Admit: 2022-08-25 | Discharge: 2022-08-25 | Disposition: A | Discharge status: Home / Self Care | Payer: 59 | Attending: Emergency Medicine | Admitting: Emergency Medicine

## 2022-08-25 ENCOUNTER — Encounter (HOSPITAL_BASED_OUTPATIENT_CLINIC_OR_DEPARTMENT_OTHER): Payer: Self-pay

## 2022-08-25 ENCOUNTER — Other Ambulatory Visit: Payer: Self-pay

## 2022-08-25 DIAGNOSIS — N898 Other specified noninflammatory disorders of vagina: Secondary | ICD-10-CM | POA: Diagnosis not present

## 2022-08-25 DIAGNOSIS — B3731 Acute candidiasis of vulva and vagina: Secondary | ICD-10-CM | POA: Diagnosis not present

## 2022-08-25 DIAGNOSIS — Z91010 Allergy to peanuts: Secondary | ICD-10-CM | POA: Insufficient documentation

## 2022-08-25 DIAGNOSIS — B379 Candidiasis, unspecified: Secondary | ICD-10-CM

## 2022-08-25 DIAGNOSIS — R1030 Lower abdominal pain, unspecified: Secondary | ICD-10-CM | POA: Diagnosis not present

## 2022-08-25 DIAGNOSIS — E860 Dehydration: Secondary | ICD-10-CM

## 2022-08-25 LAB — WET PREP, GENITAL
Clue Cells Wet Prep HPF POC: NONE SEEN
Sperm: NONE SEEN
Trich, Wet Prep: NONE SEEN
WBC, Wet Prep HPF POC: <10 (ref <10)

## 2022-08-25 LAB — COMPREHENSIVE METABOLIC PANEL
ALT: 27 U/L (ref 0–44)
AST: 18 U/L (ref 15–41)
Albumin: 3.6 g/dL (ref 3.5–5.0)
Alkaline Phosphatase: 64 U/L (ref 38–126)
Anion gap: 5 (ref 5–15)
BUN: 8 mg/dL (ref 6–20)
CO2: 25 mmol/L (ref 22–32)
Calcium: 8.5 mg/dL — ABNORMAL LOW (ref 8.9–10.3)
Chloride: 103 mmol/L (ref 98–111)
Creatinine, Ser: 0.65 mg/dL (ref 0.44–1.00)
GFR, Estimated: >60 mL/min (ref >60)
Glucose, Bld: 100 mg/dL — ABNORMAL HIGH (ref 70–99)
Potassium: 3.5 mmol/L (ref 3.5–5.1)
Sodium: 133 mmol/L — ABNORMAL LOW (ref 135–145)
Total Bilirubin: 0.4 mg/dL (ref 0.3–1.2)
Total Protein: 6.9 g/dL (ref 6.5–8.1)

## 2022-08-25 LAB — HIV ANTIBODY (ROUTINE TESTING W REFLEX): HIV Screen 4th Generation wRfx: NONREACTIVE

## 2022-08-25 LAB — CBC
HCT: 38.5 % (ref 36.0–46.0)
Hemoglobin: 12.7 g/dL (ref 12.0–15.0)
MCH: 27.8 pg (ref 26.0–34.0)
MCHC: 33 g/dL (ref 30.0–36.0)
MCV: 84.2 fL (ref 80.0–100.0)
Platelets: 268 10*3/uL (ref 150–400)
RBC: 4.57 MIL/uL (ref 3.87–5.11)
RDW: 12.6 % (ref 11.5–15.5)
WBC: 6.9 10*3/uL (ref 4.0–10.5)
nRBC: 0 % (ref 0.0–0.2)

## 2022-08-25 LAB — URINALYSIS, ROUTINE W REFLEX MICROSCOPIC
Bilirubin Urine: NEGATIVE
Glucose, UA: NEGATIVE mg/dL
Hgb urine dipstick: NEGATIVE
Ketones, ur: NEGATIVE mg/dL
Nitrite: NEGATIVE
Protein, ur: NEGATIVE mg/dL
Specific Gravity, Urine: >=1.03 (ref 1.005–1.030)
pH: 5.5 (ref 5.0–8.0)

## 2022-08-25 LAB — PREGNANCY, URINE: Preg Test, Ur: NEGATIVE

## 2022-08-25 LAB — URINALYSIS, MICROSCOPIC (REFLEX)

## 2022-08-25 LAB — LIPASE, BLOOD: Lipase: 28 U/L (ref 11–51)

## 2022-08-25 MED ORDER — SODIUM CHLORIDE 0.9 % IV BOLUS
1000.0000 mL | Freq: Once | INTRAVENOUS | Status: AC
  Administered 2022-08-25: 1000 mL via INTRAVENOUS

## 2022-08-25 MED ORDER — FLUCONAZOLE 200 MG PO TABS
200.0000 mg | ORAL_TABLET | Freq: Every day | ORAL | 0 refills | Status: AC
  Filled 2022-08-25: qty 2, 2d supply, fill #0

## 2022-08-25 MED ORDER — ACETAMINOPHEN 325 MG PO TABS
650.0000 mg | ORAL_TABLET | Freq: Once | ORAL | Status: AC
  Administered 2022-08-25: 650 mg via ORAL
  Filled 2022-08-25: qty 2

## 2022-08-25 NOTE — ED Notes (Addendum)
Pt has consented to blood draw and IV placement

## 2022-08-25 NOTE — ED Notes (Signed)
EDP at bedside to speak with pt. Continues to refuse blood work PO fluids provided

## 2022-08-25 NOTE — ED Notes (Signed)
Pt questioning why she needs blood work when all she feels she needs are IV fluids. Explained to pt that blood work up was needed to rule out any acute findings as well as dehydration. Pt refuses at this time. EDP notified

## 2022-08-25 NOTE — ED Notes (Signed)
EDP at bedside  
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
## 2022-08-25 NOTE — ED Notes (Signed)
Urine specimen in lab 
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
## 2022-08-25 NOTE — Discharge Instructions (Addendum)
Recommend that you follow-up with your primary care doctor, please reach out to the resources that are provided above to seek safe shelter, housing, if you are hoping to speak to a social worker in the future recommend that you present to [facility] or [facility] emergency department as there are no social workers at [facility].  Please follow up on the results of your GC/Chlamydia probes, as well as HIV, RPR on your portal.
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
## 2022-08-26 LAB — GC/CHLAMYDIA PROBE AMP (~~LOC~~) NOT AT ARMC
Chlamydia: NEGATIVE
Comment: NEGATIVE
Comment: NORMAL
Neisseria Gonorrhea: NEGATIVE

## 2022-08-26 LAB — RPR: RPR Ser Ql: NONREACTIVE

## 2022-08-29 ENCOUNTER — Other Ambulatory Visit: Payer: Self-pay

## 2022-08-29 ENCOUNTER — Encounter (HOSPITAL_BASED_OUTPATIENT_CLINIC_OR_DEPARTMENT_OTHER): Payer: Self-pay | Admitting: Emergency Medicine

## 2022-08-29 ENCOUNTER — Emergency Department (HOSPITAL_BASED_OUTPATIENT_CLINIC_OR_DEPARTMENT_OTHER)
Admission: EM | Admit: 2022-08-29 | Discharge: 2022-08-29 | Disposition: A | Discharge status: Home / Self Care | Payer: 59 | Attending: Emergency Medicine | Admitting: Emergency Medicine

## 2022-08-29 DIAGNOSIS — Z91010 Allergy to peanuts: Secondary | ICD-10-CM | POA: Diagnosis not present

## 2022-08-29 DIAGNOSIS — K029 Dental caries, unspecified: Secondary | ICD-10-CM | POA: Insufficient documentation

## 2022-08-29 DIAGNOSIS — K0889 Other specified disorders of teeth and supporting structures: Secondary | ICD-10-CM | POA: Diagnosis not present

## 2022-08-29 LAB — PREGNANCY, URINE: Preg Test, Ur: NEGATIVE

## 2022-08-29 MED ORDER — OXYCODONE-ACETAMINOPHEN 5-325 MG PO TABS
1.0000 | ORAL_TABLET | Freq: Once | ORAL | Status: AC
  Administered 2022-08-29: 1 via ORAL
  Filled 2022-08-29: qty 1

## 2022-08-29 MED ORDER — AMOXICILLIN 500 MG PO CAPS
500.0000 mg | ORAL_CAPSULE | Freq: Three times a day (TID) | ORAL | 0 refills | Status: AC
  Filled 2022-08-31: qty 21, 7d supply, fill #0

## 2022-08-29 NOTE — ED Triage Notes (Signed)
Patient c/o dental pain, poor dental health and headache ongoing for years.

## 2022-08-29 NOTE — ED Notes (Signed)
Pt called nurses station c/o of feeling "hot to touch". RN at bedside, normal vitals and temp noted and documented. Pt skin WNL

## 2022-08-29 NOTE — ED Notes (Signed)
Pt given specimen cup for a sample.

## 2022-08-29 NOTE — ED Provider Notes (Signed)
[facility] Provider Note   CSN: [id number] Arrival date & time: 08/29/22  1412     History  Chief Complaint  Patient presents with   Dental Pain    Cheryl Fitzgerald is a 27 y.o. female.  With past medical history of GAD, adjustment disorder, housing instability who presents to the emergency department with dental pain.  States that she has had dental pain "her whole life."  She states that it is recently worsened over the past few months.  She describes having left lower dental pain.  She has been drinking liquids but has difficulty chewing solid foods.  She describes having facial swelling or difficulty opening her mouth at times.  She denies having fevers.  Is that she does not have a dentist.  States that she is homeless and has no ability to pay for dental procedures.   Dental Pain      Home Medications Prior to Admission medications   Medication Sig Start Date End Date Taking? Authorizing Provider  amoxicillin (AMOXIL) 500 MG capsule Take 1 capsule (500 mg total) by mouth 3 (three) times daily for 7 days. 08/29/22 09/05/22 Yes Mickie Hillier, PA-C  amoxicillin-clavulanate (AUGMENTIN) 875-125 MG tablet Take 1 tablet by mouth every 12 (twelve) hours. 08/15/22   Azucena Cecil, PA-C  dicyclomine (BENTYL) 20 MG tablet Take 1 tablet (20 mg total) by mouth every 8 (eight) hours as needed for spasms. 02/22/21   Petrucelli, Glynda Jaeger, PA-C  HYDROcodone-acetaminophen (NORCO/VICODIN) 5-325 MG tablet Take 1 tablet by mouth every 4 (four) hours as needed. 08/15/22   Azucena Cecil, PA-C  hydrOXYzine (ATARAX) 25 MG tablet Take 1 tablet (25 mg total) by mouth every 6 (six) hours. 02/17/22   Redwine, Madison A, PA-C  ibuprofen (ADVIL) 800 MG tablet Take 1 tablet (800 mg total) by mouth every 8 (eight) hours as needed (pain). 08/07/22   Barrett Henle, MD  Multiple Vitamin (MULTIVITAMIN) tablet Take 1 tablet by mouth daily.    [provider]  ondansetron (ZOFRAN-ODT) 4 MG disintegrating tablet Take 1 tablet (4 mg total) by mouth every 8 (eight) hours as needed for nausea or vomiting. 08/07/22   Barrett Henle, MD      Allergies    Gramineae pollens and Peanut oil    Review of Systems   Review of Systems  HENT:  Positive for dental problem.   All other systems reviewed and are negative.   Physical Exam Updated Vital Signs BP 119/67   Pulse 82   Temp 98.6 F (37 C) (Oral)   Resp 16   Ht 5\' 6"  (1.676 m)   Wt 108.9 kg   LMP 08/02/2022 (Approximate)   SpO2 97%   BMI 38.75 kg/m  Physical Exam Vitals and nursing note reviewed.  Constitutional:      General: She is not in acute distress.    Appearance: Normal appearance. She is obese. She is not ill-appearing or toxic-appearing.  HENT:     Head: Normocephalic and atraumatic.     Comments: Overall poor dentition with multiple dental caries and decayed teeth.  There is no gingival erythema or swelling.  There is no obvious dental abscess.  There is no facial phlegmon or facial swelling.  The oropharynx is clear without tonsillar swelling or exudates.  There is no respiratory distress.  The floor of the mouth is soft with no swelling.  There is no submandibular swelling.  She has  no trismus on exam. Eyes:     General: No scleral icterus.    Extraocular Movements: Extraocular movements intact.     Pupils: Pupils are equal, round, and reactive to light.  Cardiovascular:     Rate and Rhythm: Normal rate.     Pulses: Normal pulses.     Heart sounds: No murmur heard. Pulmonary:     Effort: Pulmonary effort is normal. No respiratory distress.     Breath sounds: No stridor. No wheezing.  Musculoskeletal:     Cervical back: Normal range of motion and neck supple. No tenderness.  Lymphadenopathy:     Cervical: No cervical adenopathy.  Skin:    General: Skin is warm and dry.     Findings: No rash.  Neurological:     General: No focal deficit present.      Mental Status: She is alert and oriented to person, place, and time.  Psychiatric:        Attention and Perception: Attention normal.        Mood and Affect: Affect is labile.        Speech: Speech is rapid and pressured.        Behavior: Behavior is cooperative.        Cognition and Memory: Cognition normal.        Judgment: Judgment is impulsive and inappropriate.     ED Results / Procedures / Treatments   Labs (all labs ordered are listed, but only abnormal results are displayed) Labs Reviewed  PREGNANCY, URINE    EKG None  Radiology No results found.  Procedures Procedures   Medications Ordered in ED Medications  oxyCODONE-acetaminophen (PERCOCET/ROXICET) 5-325 MG per tablet 1 tablet (1 tablet Oral Given 08/29/22 1604)    ED Course/ Medical Decision Making/ A&P    Medical Decision Making Amount and/or Complexity of Data Reviewed Labs: ordered.  Risk Prescription drug management.  Initial Impression and Ddx 27 year old female who presents to the emergency department with dental pain Patient PMH that increases complexity of ED encounter: Homelessness, adjustment disorder, GAD, depression Differential: Dental cary, deep space infection, tooth fracture, avulsion, bleeding socket, RPA, PTA, Ludwig's angina, periapical abscess, etc    Interpretation of Diagnostics I independent reviewed and interpreted the labs as followed: urine pregnancy negative  - I independently visualized the following imaging with scope of interpretation limited to determining acute life threatening conditions related to emergency care: not indicated   Patient Reassessment and Ultimate Disposition/Management She is overall well-appearing, nonseptic and nontoxic in appearance.  She is hemodynamically stable without fever.  She has poor dentition with obvious dental caries.  There is no bleeding or dry sockets.  There is no avulsed teeth.  She does have decayed teeth in previous dental  fractures that are not new.  There is no evidence of a periapical abscess or necrotizing gingivitis.  She has no tonsillar swelling or exudates concerning for PTA.  The floor the mouth is soft with no swelling.  I have low concern for Ludwig's angina.  I have low suspicion for a deep space osteomyelitis.  There is no facial phlegmon or abscess. She did request urine pregnancy study because she has not had a period since the last time she came to the emergency department.  This was done and is negative.  Will prescribe her amoxicillin.  She also request pain medication here in the emergency department and I provided her with a single dose of Percocet.  I have also given her a Clinical research associate  guide as well as a good Rx slip.  The outpatient pharmacy is not open and so I have chosen the closest pharmacy to send her medications to which is 1 mile away.  To be noted, the patient has very manipulative behavior.  I have reviewed her chart and previous ED visits for similar complaints.  Before even talking about her dental pain she immediately talks about how she has only been able to have relief with hydrocodone.  She then jumps to talking about how she was abandoned by her mother and has been abandoned all her life. When talking about her dental pain she states that it has been going on her entire life and then continues to have fast pressured speech about her mother not providing dental care.  The last time she presented for dental pain she threatened to overdose on Tylenol if she was not prescribed opioids.  The patient has been appropriately medically screened and/or stabilized in the ED. I have low suspicion for any other emergent medical condition which would require further screening, evaluation or treatment in the ED or require inpatient management. At time of discharge the patient is hemodynamically stable and in no acute distress. I have discussed work-up results and diagnosis with patient and answered all  questions. Patient is agreeable with discharge plan. We discussed strict return precautions for returning to the emergency department and they verbalized understanding.     Patient management required discussion with the following services or consulting groups:  None  Complexity of Problems Addressed Acute uncomplicated illness or injury with no diagnostics  Additional Data Reviewed and Analyzed Further history obtained from: Past medical history and medications listed in the EMR, Prior ED visit notes, Recent discharge summary, Care Everywhere, and Prior labs/imaging results  Patient Encounter Risk Assessment Prescriptions, SDOH impact on management, and Use of parenteral controlled substances  Final Clinical Impression(s) / ED Diagnoses Final diagnoses:  Dental caries    Rx / DC Orders ED Discharge Orders          Ordered    amoxicillin (AMOXIL) 500 MG capsule  3 times daily        08/29/22 1547              Mickie Hillier, PA-C 08/29/22 1617    Blanchie Dessert, MD 08/30/22 1409

## 2022-08-29 NOTE — Discharge Instructions (Addendum)
You were seen in the urgency department today for dental pain.  I prescribed you amoxicillin that you can take 3 times a day over the next 7 days.  Please return to emergency department if you have facial swelling or fever.

## 2022-08-29 NOTE — ED Notes (Signed)
Discharge paperwork reviewed entirely with patient, including Rx's and follow up care. Pain was under control. Pt verbalized understanding as well as all parties involved. No questions or concerns voiced at the time of discharge. No acute distress noted.   Pt ambulated out to PVA without incident or assistance.  

## 2022-08-31 ENCOUNTER — Other Ambulatory Visit (HOSPITAL_BASED_OUTPATIENT_CLINIC_OR_DEPARTMENT_OTHER): Payer: Self-pay

## 2022-12-07 DIAGNOSIS — Z5900 Homelessness unspecified: Secondary | ICD-10-CM | POA: Diagnosis not present

## 2022-12-07 DIAGNOSIS — R300 Dysuria: Secondary | ICD-10-CM | POA: Diagnosis not present

## 2022-12-07 DIAGNOSIS — R8290 Unspecified abnormal findings in urine: Secondary | ICD-10-CM | POA: Diagnosis not present

## 2022-12-07 DIAGNOSIS — Z87891 Personal history of nicotine dependence: Secondary | ICD-10-CM | POA: Diagnosis not present

## 2022-12-07 DIAGNOSIS — R350 Frequency of micturition: Secondary | ICD-10-CM | POA: Diagnosis not present

## 2022-12-30 DIAGNOSIS — K047 Periapical abscess without sinus: Secondary | ICD-10-CM | POA: Diagnosis not present

## 2023-03-27 DIAGNOSIS — R1110 Vomiting, unspecified: Secondary | ICD-10-CM | POA: Diagnosis not present

## 2023-03-27 DIAGNOSIS — R109 Unspecified abdominal pain: Secondary | ICD-10-CM | POA: Diagnosis not present

## 2023-03-27 DIAGNOSIS — L290 Pruritus ani: Secondary | ICD-10-CM | POA: Diagnosis not present

## 2023-03-27 DIAGNOSIS — R1030 Lower abdominal pain, unspecified: Secondary | ICD-10-CM | POA: Diagnosis not present

## 2023-03-27 DIAGNOSIS — K529 Noninfective gastroenteritis and colitis, unspecified: Secondary | ICD-10-CM | POA: Diagnosis not present
